# Patient Record
Sex: Female | Born: 1950 | Race: White | Hispanic: No | Marital: Married
Health system: Southern US, Community
[De-identification: ages and names within clinical notes are randomized; demographics above are authoritative.]

## PROBLEM LIST (undated history)

## (undated) DIAGNOSIS — H269 Unspecified cataract: Secondary | ICD-10-CM

## (undated) DIAGNOSIS — R1310 Dysphagia, unspecified: Secondary | ICD-10-CM

## (undated) DIAGNOSIS — M1990 Unspecified osteoarthritis, unspecified site: Secondary | ICD-10-CM

## (undated) DIAGNOSIS — I509 Heart failure, unspecified: Secondary | ICD-10-CM

## (undated) DIAGNOSIS — N189 Chronic kidney disease, unspecified: Secondary | ICD-10-CM

## (undated) DIAGNOSIS — T7840XA Allergy, unspecified, initial encounter: Secondary | ICD-10-CM

## (undated) DIAGNOSIS — M8580 Other specified disorders of bone density and structure, unspecified site: Secondary | ICD-10-CM

## (undated) DIAGNOSIS — R011 Cardiac murmur, unspecified: Secondary | ICD-10-CM

## (undated) DIAGNOSIS — J45909 Unspecified asthma, uncomplicated: Secondary | ICD-10-CM

## (undated) DIAGNOSIS — K219 Gastro-esophageal reflux disease without esophagitis: Secondary | ICD-10-CM

## (undated) DIAGNOSIS — I10 Essential (primary) hypertension: Secondary | ICD-10-CM

## (undated) DIAGNOSIS — K635 Polyp of colon: Secondary | ICD-10-CM

## (undated) DIAGNOSIS — N390 Urinary tract infection, site not specified: Secondary | ICD-10-CM

## (undated) DIAGNOSIS — R0602 Shortness of breath: Secondary | ICD-10-CM

## (undated) DIAGNOSIS — R140 Abdominal distension (gaseous): Secondary | ICD-10-CM

## (undated) DIAGNOSIS — E079 Disorder of thyroid, unspecified: Secondary | ICD-10-CM

## (undated) HISTORY — DX: Unspecified asthma, uncomplicated: J45.909

## (undated) HISTORY — DX: Dysphagia, unspecified: R13.10

## (undated) HISTORY — DX: Allergy, unspecified, initial encounter: T78.40XA

## (undated) HISTORY — PX: FOOT SURGERY: SHX648

## (undated) HISTORY — DX: Essential (primary) hypertension: I10

## (undated) HISTORY — DX: Chronic kidney disease, unspecified: N18.9

## (undated) HISTORY — DX: Urinary tract infection, site not specified: N39.0

## (undated) HISTORY — DX: Unspecified osteoarthritis, unspecified site: M19.90

## (undated) HISTORY — DX: Polyp of colon: K63.5

## (undated) HISTORY — DX: Unspecified cataract: H26.9

## (undated) HISTORY — DX: Gastro-esophageal reflux disease without esophagitis: K21.9

## (undated) HISTORY — PX: ROTATOR CUFF REPAIR: SHX139

## (undated) HISTORY — DX: Shortness of breath: R06.02

## (undated) HISTORY — DX: Cardiac murmur, unspecified: R01.1

## (undated) HISTORY — DX: Heart failure, unspecified: I50.9

## (undated) HISTORY — DX: Disorder of thyroid, unspecified: E07.9

## (undated) HISTORY — DX: Other specified disorders of bone density and structure, unspecified site: M85.80

## (undated) HISTORY — DX: Abdominal distension (gaseous): R14.0

---

## 2008-12-22 DIAGNOSIS — E039 Hypothyroidism, unspecified: Secondary | ICD-10-CM | POA: Insufficient documentation

## 2010-12-14 DIAGNOSIS — L739 Follicular disorder, unspecified: Secondary | ICD-10-CM | POA: Insufficient documentation

## 2012-11-06 DIAGNOSIS — M8580 Other specified disorders of bone density and structure, unspecified site: Secondary | ICD-10-CM | POA: Insufficient documentation

## 2013-08-26 DIAGNOSIS — Z720 Tobacco use: Secondary | ICD-10-CM | POA: Insufficient documentation

## 2015-02-15 DIAGNOSIS — R7301 Impaired fasting glucose: Secondary | ICD-10-CM | POA: Insufficient documentation

## 2015-09-01 DIAGNOSIS — R0602 Shortness of breath: Secondary | ICD-10-CM | POA: Diagnosis not present

## 2015-09-10 DIAGNOSIS — I340 Nonrheumatic mitral (valve) insufficiency: Secondary | ICD-10-CM | POA: Diagnosis not present

## 2015-09-10 DIAGNOSIS — I517 Cardiomegaly: Secondary | ICD-10-CM | POA: Diagnosis not present

## 2015-09-10 DIAGNOSIS — I071 Rheumatic tricuspid insufficiency: Secondary | ICD-10-CM | POA: Diagnosis not present

## 2015-10-13 DIAGNOSIS — R0602 Shortness of breath: Secondary | ICD-10-CM | POA: Diagnosis not present

## 2016-01-10 DIAGNOSIS — E039 Hypothyroidism, unspecified: Secondary | ICD-10-CM | POA: Diagnosis not present

## 2016-03-21 DIAGNOSIS — L680 Hirsutism: Secondary | ICD-10-CM | POA: Diagnosis not present

## 2016-03-21 DIAGNOSIS — D485 Neoplasm of uncertain behavior of skin: Secondary | ICD-10-CM | POA: Diagnosis not present

## 2016-03-21 DIAGNOSIS — L578 Other skin changes due to chronic exposure to nonionizing radiation: Secondary | ICD-10-CM | POA: Diagnosis not present

## 2016-03-21 DIAGNOSIS — L700 Acne vulgaris: Secondary | ICD-10-CM | POA: Diagnosis not present

## 2016-03-22 DIAGNOSIS — L97929 Non-pressure chronic ulcer of unspecified part of left lower leg with unspecified severity: Secondary | ICD-10-CM | POA: Diagnosis not present

## 2016-05-25 DIAGNOSIS — Z23 Encounter for immunization: Secondary | ICD-10-CM | POA: Diagnosis not present

## 2016-06-15 DIAGNOSIS — E039 Hypothyroidism, unspecified: Secondary | ICD-10-CM | POA: Diagnosis not present

## 2016-06-16 DIAGNOSIS — H04123 Dry eye syndrome of bilateral lacrimal glands: Secondary | ICD-10-CM | POA: Diagnosis not present

## 2016-06-16 DIAGNOSIS — Z1231 Encounter for screening mammogram for malignant neoplasm of breast: Secondary | ICD-10-CM | POA: Diagnosis not present

## 2016-06-16 DIAGNOSIS — Z961 Presence of intraocular lens: Secondary | ICD-10-CM | POA: Diagnosis not present

## 2016-06-16 DIAGNOSIS — H26493 Other secondary cataract, bilateral: Secondary | ICD-10-CM | POA: Diagnosis not present

## 2016-06-17 DIAGNOSIS — R42 Dizziness and giddiness: Secondary | ICD-10-CM | POA: Diagnosis not present

## 2016-06-17 DIAGNOSIS — R110 Nausea: Secondary | ICD-10-CM | POA: Diagnosis not present

## 2016-06-17 DIAGNOSIS — H6983 Other specified disorders of Eustachian tube, bilateral: Secondary | ICD-10-CM | POA: Diagnosis not present

## 2016-06-17 DIAGNOSIS — F439 Reaction to severe stress, unspecified: Secondary | ICD-10-CM | POA: Diagnosis not present

## 2016-08-02 DIAGNOSIS — E039 Hypothyroidism, unspecified: Secondary | ICD-10-CM | POA: Diagnosis not present

## 2016-09-21 DIAGNOSIS — E039 Hypothyroidism, unspecified: Secondary | ICD-10-CM | POA: Diagnosis not present

## 2016-11-07 DIAGNOSIS — T3695XA Adverse effect of unspecified systemic antibiotic, initial encounter: Secondary | ICD-10-CM | POA: Diagnosis not present

## 2016-11-07 DIAGNOSIS — R300 Dysuria: Secondary | ICD-10-CM | POA: Diagnosis not present

## 2016-11-07 DIAGNOSIS — N3091 Cystitis, unspecified with hematuria: Secondary | ICD-10-CM | POA: Diagnosis not present

## 2016-11-07 DIAGNOSIS — N3090 Cystitis, unspecified without hematuria: Secondary | ICD-10-CM | POA: Diagnosis not present

## 2016-11-07 DIAGNOSIS — L309 Dermatitis, unspecified: Secondary | ICD-10-CM | POA: Diagnosis not present

## 2016-11-07 DIAGNOSIS — B379 Candidiasis, unspecified: Secondary | ICD-10-CM | POA: Diagnosis not present

## 2016-11-07 DIAGNOSIS — R8290 Unspecified abnormal findings in urine: Secondary | ICD-10-CM | POA: Diagnosis not present

## 2016-11-16 DIAGNOSIS — E039 Hypothyroidism, unspecified: Secondary | ICD-10-CM | POA: Diagnosis not present

## 2017-01-05 DIAGNOSIS — M25511 Pain in right shoulder: Secondary | ICD-10-CM | POA: Diagnosis not present

## 2017-01-05 DIAGNOSIS — M542 Cervicalgia: Secondary | ICD-10-CM | POA: Diagnosis not present

## 2017-01-05 DIAGNOSIS — M79641 Pain in right hand: Secondary | ICD-10-CM | POA: Diagnosis not present

## 2017-01-05 DIAGNOSIS — M79642 Pain in left hand: Secondary | ICD-10-CM | POA: Diagnosis not present

## 2017-01-11 DIAGNOSIS — M542 Cervicalgia: Secondary | ICD-10-CM | POA: Diagnosis not present

## 2017-01-11 DIAGNOSIS — M5013 Cervical disc disorder with radiculopathy, cervicothoracic region: Secondary | ICD-10-CM | POA: Diagnosis not present

## 2017-01-16 DIAGNOSIS — M542 Cervicalgia: Secondary | ICD-10-CM | POA: Diagnosis not present

## 2017-01-16 DIAGNOSIS — M5013 Cervical disc disorder with radiculopathy, cervicothoracic region: Secondary | ICD-10-CM | POA: Diagnosis not present

## 2017-01-18 DIAGNOSIS — M542 Cervicalgia: Secondary | ICD-10-CM | POA: Diagnosis not present

## 2017-01-18 DIAGNOSIS — M5013 Cervical disc disorder with radiculopathy, cervicothoracic region: Secondary | ICD-10-CM | POA: Diagnosis not present

## 2017-01-23 DIAGNOSIS — M542 Cervicalgia: Secondary | ICD-10-CM | POA: Diagnosis not present

## 2017-01-23 DIAGNOSIS — M5013 Cervical disc disorder with radiculopathy, cervicothoracic region: Secondary | ICD-10-CM | POA: Diagnosis not present

## 2017-01-24 DIAGNOSIS — M542 Cervicalgia: Secondary | ICD-10-CM | POA: Diagnosis not present

## 2017-01-24 DIAGNOSIS — M5013 Cervical disc disorder with radiculopathy, cervicothoracic region: Secondary | ICD-10-CM | POA: Diagnosis not present

## 2017-02-01 DIAGNOSIS — M542 Cervicalgia: Secondary | ICD-10-CM | POA: Diagnosis not present

## 2017-02-01 DIAGNOSIS — M5013 Cervical disc disorder with radiculopathy, cervicothoracic region: Secondary | ICD-10-CM | POA: Diagnosis not present

## 2017-02-02 DIAGNOSIS — M542 Cervicalgia: Secondary | ICD-10-CM | POA: Diagnosis not present

## 2017-02-02 DIAGNOSIS — M5013 Cervical disc disorder with radiculopathy, cervicothoracic region: Secondary | ICD-10-CM | POA: Diagnosis not present

## 2017-02-05 DIAGNOSIS — M542 Cervicalgia: Secondary | ICD-10-CM | POA: Diagnosis not present

## 2017-02-05 DIAGNOSIS — M5013 Cervical disc disorder with radiculopathy, cervicothoracic region: Secondary | ICD-10-CM | POA: Diagnosis not present

## 2017-02-07 DIAGNOSIS — M542 Cervicalgia: Secondary | ICD-10-CM | POA: Diagnosis not present

## 2017-02-07 DIAGNOSIS — M5013 Cervical disc disorder with radiculopathy, cervicothoracic region: Secondary | ICD-10-CM | POA: Diagnosis not present

## 2017-02-08 DIAGNOSIS — M5013 Cervical disc disorder with radiculopathy, cervicothoracic region: Secondary | ICD-10-CM | POA: Diagnosis not present

## 2017-02-08 DIAGNOSIS — M542 Cervicalgia: Secondary | ICD-10-CM | POA: Diagnosis not present

## 2017-02-16 DIAGNOSIS — M25511 Pain in right shoulder: Secondary | ICD-10-CM | POA: Diagnosis not present

## 2017-02-16 DIAGNOSIS — M5013 Cervical disc disorder with radiculopathy, cervicothoracic region: Secondary | ICD-10-CM | POA: Diagnosis not present

## 2017-02-16 DIAGNOSIS — M542 Cervicalgia: Secondary | ICD-10-CM | POA: Diagnosis not present

## 2017-02-19 DIAGNOSIS — L309 Dermatitis, unspecified: Secondary | ICD-10-CM | POA: Diagnosis not present

## 2017-02-19 DIAGNOSIS — L570 Actinic keratosis: Secondary | ICD-10-CM | POA: Diagnosis not present

## 2017-02-22 DIAGNOSIS — M542 Cervicalgia: Secondary | ICD-10-CM | POA: Diagnosis not present

## 2017-02-22 DIAGNOSIS — M5013 Cervical disc disorder with radiculopathy, cervicothoracic region: Secondary | ICD-10-CM | POA: Diagnosis not present

## 2017-02-23 DIAGNOSIS — M542 Cervicalgia: Secondary | ICD-10-CM | POA: Diagnosis not present

## 2017-02-23 DIAGNOSIS — M5013 Cervical disc disorder with radiculopathy, cervicothoracic region: Secondary | ICD-10-CM | POA: Diagnosis not present

## 2017-03-05 DIAGNOSIS — M542 Cervicalgia: Secondary | ICD-10-CM | POA: Diagnosis not present

## 2017-03-05 DIAGNOSIS — M47812 Spondylosis without myelopathy or radiculopathy, cervical region: Secondary | ICD-10-CM | POA: Diagnosis not present

## 2017-03-05 DIAGNOSIS — M5013 Cervical disc disorder with radiculopathy, cervicothoracic region: Secondary | ICD-10-CM | POA: Diagnosis not present

## 2017-03-07 DIAGNOSIS — M5013 Cervical disc disorder with radiculopathy, cervicothoracic region: Secondary | ICD-10-CM | POA: Diagnosis not present

## 2017-03-07 DIAGNOSIS — M542 Cervicalgia: Secondary | ICD-10-CM | POA: Diagnosis not present

## 2017-03-09 DIAGNOSIS — N951 Menopausal and female climacteric states: Secondary | ICD-10-CM | POA: Diagnosis not present

## 2017-03-09 DIAGNOSIS — N3281 Overactive bladder: Secondary | ICD-10-CM | POA: Diagnosis not present

## 2017-03-14 DIAGNOSIS — M542 Cervicalgia: Secondary | ICD-10-CM | POA: Diagnosis not present

## 2017-03-14 DIAGNOSIS — M5013 Cervical disc disorder with radiculopathy, cervicothoracic region: Secondary | ICD-10-CM | POA: Diagnosis not present

## 2017-03-22 DIAGNOSIS — M47812 Spondylosis without myelopathy or radiculopathy, cervical region: Secondary | ICD-10-CM | POA: Diagnosis not present

## 2017-04-11 DIAGNOSIS — M5412 Radiculopathy, cervical region: Secondary | ICD-10-CM | POA: Diagnosis not present

## 2017-05-22 DIAGNOSIS — M1990 Unspecified osteoarthritis, unspecified site: Secondary | ICD-10-CM | POA: Diagnosis not present

## 2017-05-22 DIAGNOSIS — Z23 Encounter for immunization: Secondary | ICD-10-CM | POA: Diagnosis not present

## 2017-05-22 DIAGNOSIS — N951 Menopausal and female climacteric states: Secondary | ICD-10-CM | POA: Diagnosis not present

## 2017-06-22 DIAGNOSIS — H524 Presbyopia: Secondary | ICD-10-CM | POA: Diagnosis not present

## 2017-06-22 DIAGNOSIS — H5231 Anisometropia: Secondary | ICD-10-CM | POA: Diagnosis not present

## 2017-06-22 DIAGNOSIS — H43813 Vitreous degeneration, bilateral: Secondary | ICD-10-CM | POA: Diagnosis not present

## 2017-06-25 DIAGNOSIS — Z1231 Encounter for screening mammogram for malignant neoplasm of breast: Secondary | ICD-10-CM | POA: Diagnosis not present

## 2017-06-25 DIAGNOSIS — M8588 Other specified disorders of bone density and structure, other site: Secondary | ICD-10-CM | POA: Diagnosis not present

## 2017-07-31 DIAGNOSIS — M47812 Spondylosis without myelopathy or radiculopathy, cervical region: Secondary | ICD-10-CM | POA: Diagnosis not present

## 2017-08-02 DIAGNOSIS — E038 Other specified hypothyroidism: Secondary | ICD-10-CM | POA: Diagnosis not present

## 2017-08-02 DIAGNOSIS — Z6830 Body mass index (BMI) 30.0-30.9, adult: Secondary | ICD-10-CM | POA: Diagnosis not present

## 2017-08-02 DIAGNOSIS — K219 Gastro-esophageal reflux disease without esophagitis: Secondary | ICD-10-CM | POA: Diagnosis not present

## 2017-08-02 DIAGNOSIS — L680 Hirsutism: Secondary | ICD-10-CM | POA: Diagnosis not present

## 2017-08-02 DIAGNOSIS — M859 Disorder of bone density and structure, unspecified: Secondary | ICD-10-CM | POA: Diagnosis not present

## 2017-08-02 DIAGNOSIS — M5030 Other cervical disc degeneration, unspecified cervical region: Secondary | ICD-10-CM | POA: Diagnosis not present

## 2017-08-02 DIAGNOSIS — E7849 Other hyperlipidemia: Secondary | ICD-10-CM | POA: Diagnosis not present

## 2017-08-02 DIAGNOSIS — Z1389 Encounter for screening for other disorder: Secondary | ICD-10-CM | POA: Diagnosis not present

## 2017-08-02 DIAGNOSIS — N3281 Overactive bladder: Secondary | ICD-10-CM | POA: Diagnosis not present

## 2017-08-02 DIAGNOSIS — R82998 Other abnormal findings in urine: Secondary | ICD-10-CM | POA: Diagnosis not present

## 2017-08-13 ENCOUNTER — Other Ambulatory Visit: Payer: Self-pay | Admitting: Internal Medicine

## 2017-08-13 DIAGNOSIS — F17201 Nicotine dependence, unspecified, in remission: Secondary | ICD-10-CM

## 2017-09-04 ENCOUNTER — Ambulatory Visit
Admission: RE | Admit: 2017-09-04 | Discharge: 2017-09-04 | Disposition: A | Discharge status: Home / Self Care | Payer: Medicare Other | Source: Ambulatory Visit | Attending: Internal Medicine | Admitting: Internal Medicine

## 2017-09-04 DIAGNOSIS — F17201 Nicotine dependence, unspecified, in remission: Secondary | ICD-10-CM

## 2017-09-04 DIAGNOSIS — Z87891 Personal history of nicotine dependence: Secondary | ICD-10-CM | POA: Diagnosis not present

## 2017-09-11 DIAGNOSIS — Z79899 Other long term (current) drug therapy: Secondary | ICD-10-CM | POA: Diagnosis not present

## 2017-09-11 DIAGNOSIS — E039 Hypothyroidism, unspecified: Secondary | ICD-10-CM | POA: Diagnosis not present

## 2017-12-14 ENCOUNTER — Emergency Department (HOSPITAL_BASED_OUTPATIENT_CLINIC_OR_DEPARTMENT_OTHER)
Admission: EM | Admit: 2017-12-14 | Discharge: 2017-12-14 | Disposition: A | Discharge status: Home / Self Care | Payer: Medicare Other | Attending: Physician Assistant | Admitting: Physician Assistant

## 2017-12-14 ENCOUNTER — Other Ambulatory Visit: Payer: Self-pay

## 2017-12-14 ENCOUNTER — Encounter (HOSPITAL_BASED_OUTPATIENT_CLINIC_OR_DEPARTMENT_OTHER): Payer: Self-pay | Admitting: *Deleted

## 2017-12-14 DIAGNOSIS — Z87891 Personal history of nicotine dependence: Secondary | ICD-10-CM | POA: Diagnosis not present

## 2017-12-14 DIAGNOSIS — H8309 Labyrinthitis, unspecified ear: Secondary | ICD-10-CM | POA: Diagnosis not present

## 2017-12-14 DIAGNOSIS — R42 Dizziness and giddiness: Secondary | ICD-10-CM

## 2017-12-14 MED ORDER — DIAZEPAM 5 MG PO TABS: 5.0000 mg | ORAL_TABLET | Freq: Three times a day (TID) | ORAL | 0 refills | Status: DC | PRN

## 2017-12-14 MED ORDER — MECLIZINE HCL 12.5 MG PO TABS: 25.0000 mg | ORAL_TABLET | Freq: Two times a day (BID) | ORAL | 0 refills | Status: DC | PRN

## 2017-12-14 NOTE — Discharge Instructions (Signed)
Please return immediately if you decide that you do want to get an MRI.  You are welcome back any time to receive this test.  Please return if your dizziness comes and continues to stay and does not go away.  Or with any other neurologic symptoms.  Please follow-up with your primary care physician ideally within 24-48 hours.

## 2017-12-14 NOTE — ED Triage Notes (Signed)
She was treated for vertigo last week. She is taking Antivert. She took 2 last night and was dizzy.

## 2017-12-14 NOTE — ED Provider Notes (Signed)
[facility] EMERGENCY DEPARTMENT Provider Note   CSN: [id number] Arrival date & time: 12/14/17  1625     History   Chief Complaint Chief Complaint  Patient presents with  . Dizziness    HPI Katherine Greer is a 67 y.o. female.  HPI   67 year old female sent here with dizziness.  Patient has had episodic dizziness.  Patient had an episode 2 years ago which resolved within the roughly 24-48 hours.  She says it feels the same today.  She did one episode 3 days ago and it got better.  It happened again today.  She reports that happens with position.  When she turns her head or leans over.  She reports that she went to go see a PA this morning.  They did a Dix-Hallpike which made her dizziness much worse.  She was then sent here for further evaluation for stroke.  The dizziness is positional.  It is episodic.  Patient has a feeling of stuffy headedness with sinus congestion and feeling of fluid behind her ears.  Patient has no difficulty ambulating. She drove here.  She has no weakness, no neurologic deficits.  History reviewed. No pertinent past medical history.  There are no active problems to display for this patient.   History reviewed. No pertinent surgical history.   OB History   None      Home Medications    Prior to Admission medications   Not on File    Family History No family history on file.  Social History Social History   Tobacco Use  . Smoking status: Former Research scientist (life sciences)  . Smokeless tobacco: Never Used  Substance Use Topics  . Alcohol use: Yes    Frequency: Never  . Drug use: Never     Allergies   Keflex [cephalexin]; Levaquin [levofloxacin in d5w]; Sulfa antibiotics; and Augmentin [amoxicillin-pot clavulanate]   Review of Systems Review of Systems  Constitutional: Negative for fatigue and fever.  HENT: Positive for sinus pressure.   Respiratory: Negative for chest tightness.   Neurological: Positive for dizziness. Negative for  tremors, syncope, facial asymmetry, speech difficulty, weakness, numbness and headaches.  All other systems reviewed and are negative.    Physical Exam Updated Vital Signs BP 126/72   Pulse 67   Temp 98.3 F (36.8 C) (Oral)   Resp 20   Ht 5\' 7"  (1.702 m)   Wt 86.2 kg (190 lb)   SpO2 100%   BMI 29.76 kg/m   Physical Exam  Constitutional: She is oriented to person, place, and time. She appears well-developed and well-nourished.  Well appearing female, not dizzy  HENT:  Head: Normocephalic and atraumatic.  Bilateral ears with mild bulging.  No erythema.  Nystagmus both eyes  Eyes: Right eye exhibits no discharge. Left eye exhibits no discharge.  Cardiovascular: Normal rate, regular rhythm and normal heart sounds.  No murmur heard. Pulmonary/Chest: Effort normal and breath sounds normal. She has no wheezes. She has no rales.  Abdominal: Soft. She exhibits no distension. There is no tenderness.  Musculoskeletal:  Finger to nose intact.  Patient able to ambulate without issue.  Cranial nerves II through VII appear intact.  Patient mild unsteadiness with closed eye rhomberg, open eye normal.     Neurological: She is oriented to person, place, and time. No cranial nerve deficit or sensory deficit.  Skin: Skin is warm and dry. She is not diaphoretic.  Psychiatric: She has a normal mood and affect.  Nursing note and vitals reviewed.  ED Treatments / Results  Labs (all labs ordered are listed, but only abnormal results are displayed) Labs Reviewed - No data to display  EKG None  Radiology No results found.  Procedures Procedures (including critical care time)  Medications Ordered in ED Medications - No data to display   Initial Impression / Assessment and Plan / ED Course  I have reviewed the triage vital signs and the nursing notes.  Pertinent labs & imaging results that were available during my care of the patient were reviewed by me and considered in my  medical decision making (see chart for details).    67 year old female sent here with dizziness.  Patient has had episodic dizziness.  Patient had an episode 2 years ago which resolved within the roughly 24-48 hours.  She says it feels the same today.  She did one episode 3 days ago and it got better.  It happened again today.  She reports that happens with position.  When she turns her head or leans over.  She reports that she went to go see a PA this morning.  They did a Dix-Hallpike which made her dizziness much worse.  She was then sent here for further evaluation for stroke.  The dizziness is positional.  It is episodic.  Patient has a feeling of stuffy headedness with sinus congestion and feeling of fluid behind her ears.  Patient has no difficulty ambulating. She drove here.  She has no weakness, no neurologic deficits.  5:46 PM Had a long detailed discussion with both patient and patient's husband about imaging.  In order to effectively rule out cerebellar stroke, we would need to transfer to [facility] for MRI.  Patient has no neurologic findings significant for stroke at this moment.  She has episodic dizziness.  Bilateral nystagmus.  And worsening with this Dix-Hallpike removed procedure.  All of these point to a peripheral, vestibular source for this.  Including patient's feelings of stuffy sinuses and bulging eardrums.   Patient appears so well.  She is well put together, drove here, has no dizziness on exam.  It is difficult to consider that she could have a stroke.  We discussed how strokes do not get better and worse and especially not usually with positionally changes.   However fgiven age, should at least consider rule out stroke.   Offered transfer to [facility].  Patient centered discussion had and questions answered.  Decision made to try outpatient treatment first, she will follow up with PCP.     Final Clinical Impressions(s) / ED Diagnoses   Final diagnoses:  None    ED  Discharge Orders    None       Jyll Tomaro, Fredia Sorrow, MD 12/14/17 1750

## 2017-12-18 DIAGNOSIS — J019 Acute sinusitis, unspecified: Secondary | ICD-10-CM | POA: Diagnosis not present

## 2017-12-18 DIAGNOSIS — R42 Dizziness and giddiness: Secondary | ICD-10-CM | POA: Diagnosis not present

## 2017-12-18 DIAGNOSIS — Z6830 Body mass index (BMI) 30.0-30.9, adult: Secondary | ICD-10-CM | POA: Diagnosis not present

## 2018-01-04 DIAGNOSIS — H8141 Vertigo of central origin, right ear: Secondary | ICD-10-CM | POA: Diagnosis not present

## 2018-01-04 DIAGNOSIS — H8110 Benign paroxysmal vertigo, unspecified ear: Secondary | ICD-10-CM | POA: Diagnosis not present

## 2018-01-04 DIAGNOSIS — H9041 Sensorineural hearing loss, unilateral, right ear, with unrestricted hearing on the contralateral side: Secondary | ICD-10-CM | POA: Diagnosis not present

## 2018-01-04 DIAGNOSIS — H9311 Tinnitus, right ear: Secondary | ICD-10-CM | POA: Diagnosis not present

## 2018-01-15 DIAGNOSIS — H8110 Benign paroxysmal vertigo, unspecified ear: Secondary | ICD-10-CM | POA: Diagnosis not present

## 2018-01-16 ENCOUNTER — Other Ambulatory Visit (HOSPITAL_COMMUNITY): Payer: Self-pay | Admitting: Otolaryngology

## 2018-01-16 DIAGNOSIS — IMO0001 Reserved for inherently not codable concepts without codable children: Secondary | ICD-10-CM

## 2018-01-23 ENCOUNTER — Ambulatory Visit (HOSPITAL_COMMUNITY)
Admission: RE | Admit: 2018-01-23 | Discharge: 2018-01-23 | Disposition: A | Discharge status: Home / Self Care | Payer: Medicare Other | Source: Ambulatory Visit | Attending: Otolaryngology | Admitting: Otolaryngology

## 2018-01-23 DIAGNOSIS — D692 Other nonthrombocytopenic purpura: Secondary | ICD-10-CM | POA: Diagnosis not present

## 2018-01-23 DIAGNOSIS — L821 Other seborrheic keratosis: Secondary | ICD-10-CM | POA: Diagnosis not present

## 2018-01-23 DIAGNOSIS — R42 Dizziness and giddiness: Secondary | ICD-10-CM | POA: Diagnosis not present

## 2018-01-23 DIAGNOSIS — D225 Melanocytic nevi of trunk: Secondary | ICD-10-CM | POA: Diagnosis not present

## 2018-01-23 DIAGNOSIS — I998 Other disorder of circulatory system: Secondary | ICD-10-CM | POA: Insufficient documentation

## 2018-01-23 DIAGNOSIS — IMO0001 Reserved for inherently not codable concepts without codable children: Secondary | ICD-10-CM

## 2018-01-23 DIAGNOSIS — H8141 Vertigo of central origin, right ear: Secondary | ICD-10-CM | POA: Insufficient documentation

## 2018-01-23 DIAGNOSIS — L309 Dermatitis, unspecified: Secondary | ICD-10-CM | POA: Diagnosis not present

## 2018-01-23 DIAGNOSIS — L814 Other melanin hyperpigmentation: Secondary | ICD-10-CM | POA: Diagnosis not present

## 2018-01-23 MED ORDER — GADOBENATE DIMEGLUMINE 529 MG/ML IV SOLN
20.0000 mL | Freq: Once | INTRAVENOUS | Status: AC | PRN
  Administered 2018-01-23: 18 mL via INTRAVENOUS

## 2018-01-24 LAB — POCT I-STAT CREATININE: Creatinine, Ser: 0.9 mg/dL (ref 0.44–1.00)

## 2018-01-25 DIAGNOSIS — E7849 Other hyperlipidemia: Secondary | ICD-10-CM | POA: Diagnosis not present

## 2018-01-25 DIAGNOSIS — E038 Other specified hypothyroidism: Secondary | ICD-10-CM | POA: Diagnosis not present

## 2018-02-04 DIAGNOSIS — I2510 Atherosclerotic heart disease of native coronary artery without angina pectoris: Secondary | ICD-10-CM | POA: Diagnosis not present

## 2018-02-04 DIAGNOSIS — E038 Other specified hypothyroidism: Secondary | ICD-10-CM | POA: Diagnosis not present

## 2018-02-04 DIAGNOSIS — J439 Emphysema, unspecified: Secondary | ICD-10-CM | POA: Diagnosis not present

## 2018-02-04 DIAGNOSIS — Z6831 Body mass index (BMI) 31.0-31.9, adult: Secondary | ICD-10-CM | POA: Diagnosis not present

## 2018-02-04 DIAGNOSIS — N3281 Overactive bladder: Secondary | ICD-10-CM | POA: Diagnosis not present

## 2018-02-04 DIAGNOSIS — R42 Dizziness and giddiness: Secondary | ICD-10-CM | POA: Diagnosis not present

## 2018-03-14 DIAGNOSIS — R300 Dysuria: Secondary | ICD-10-CM | POA: Diagnosis not present

## 2018-04-03 ENCOUNTER — Other Ambulatory Visit (HOSPITAL_COMMUNITY): Payer: Self-pay | Admitting: Internal Medicine

## 2018-04-03 DIAGNOSIS — R279 Unspecified lack of coordination: Secondary | ICD-10-CM | POA: Diagnosis not present

## 2018-04-03 DIAGNOSIS — R0602 Shortness of breath: Secondary | ICD-10-CM | POA: Diagnosis not present

## 2018-04-03 DIAGNOSIS — H539 Unspecified visual disturbance: Secondary | ICD-10-CM

## 2018-04-03 DIAGNOSIS — Z6829 Body mass index (BMI) 29.0-29.9, adult: Secondary | ICD-10-CM | POA: Diagnosis not present

## 2018-04-03 DIAGNOSIS — R2689 Other abnormalities of gait and mobility: Secondary | ICD-10-CM

## 2018-04-03 DIAGNOSIS — J019 Acute sinusitis, unspecified: Secondary | ICD-10-CM | POA: Diagnosis not present

## 2018-04-04 ENCOUNTER — Ambulatory Visit (HOSPITAL_COMMUNITY): Payer: Medicare Other

## 2018-04-05 ENCOUNTER — Ambulatory Visit (HOSPITAL_COMMUNITY)
Admission: RE | Admit: 2018-04-05 | Discharge: 2018-04-05 | Disposition: A | Discharge status: Home / Self Care | Payer: Medicare Other | Source: Ambulatory Visit | Attending: Internal Medicine | Admitting: Internal Medicine

## 2018-04-05 DIAGNOSIS — R279 Unspecified lack of coordination: Secondary | ICD-10-CM | POA: Insufficient documentation

## 2018-04-05 DIAGNOSIS — R2689 Other abnormalities of gait and mobility: Secondary | ICD-10-CM

## 2018-04-05 DIAGNOSIS — H539 Unspecified visual disturbance: Secondary | ICD-10-CM | POA: Diagnosis not present

## 2018-04-05 DIAGNOSIS — M50121 Cervical disc disorder at C4-C5 level with radiculopathy: Secondary | ICD-10-CM | POA: Diagnosis not present

## 2018-04-05 DIAGNOSIS — M4802 Spinal stenosis, cervical region: Secondary | ICD-10-CM | POA: Diagnosis not present

## 2018-04-05 DIAGNOSIS — M50323 Other cervical disc degeneration at C6-C7 level: Secondary | ICD-10-CM | POA: Diagnosis not present

## 2018-04-05 LAB — POCT I-STAT CREATININE: Creatinine, Ser: 0.8 mg/dL (ref 0.44–1.00)

## 2018-04-05 MED ORDER — GADOBENATE DIMEGLUMINE 529 MG/ML IV SOLN
18.0000 mL | Freq: Once | INTRAVENOUS | Status: AC
  Administered 2018-04-05: 18 mL via INTRAVENOUS

## 2018-04-06 ENCOUNTER — Ambulatory Visit (HOSPITAL_COMMUNITY): Payer: Medicare Other

## 2018-04-25 ENCOUNTER — Encounter: Payer: Self-pay | Admitting: Gastroenterology

## 2018-04-30 DIAGNOSIS — H903 Sensorineural hearing loss, bilateral: Secondary | ICD-10-CM | POA: Diagnosis not present

## 2018-04-30 DIAGNOSIS — J343 Hypertrophy of nasal turbinates: Secondary | ICD-10-CM | POA: Diagnosis not present

## 2018-04-30 DIAGNOSIS — R42 Dizziness and giddiness: Secondary | ICD-10-CM | POA: Diagnosis not present

## 2018-04-30 DIAGNOSIS — J310 Chronic rhinitis: Secondary | ICD-10-CM | POA: Diagnosis not present

## 2018-04-30 DIAGNOSIS — H838X3 Other specified diseases of inner ear, bilateral: Secondary | ICD-10-CM | POA: Diagnosis not present

## 2018-05-02 ENCOUNTER — Other Ambulatory Visit (INDEPENDENT_AMBULATORY_CARE_PROVIDER_SITE_OTHER): Payer: Medicare Other

## 2018-05-02 ENCOUNTER — Encounter: Payer: Self-pay | Admitting: Gastroenterology

## 2018-05-02 ENCOUNTER — Ambulatory Visit (INDEPENDENT_AMBULATORY_CARE_PROVIDER_SITE_OTHER): Payer: Medicare Other | Admitting: Gastroenterology

## 2018-05-02 VITALS — BP 114/78 | HR 82 | Ht 66.5 in | Wt 194.4 lb

## 2018-05-02 DIAGNOSIS — K219 Gastro-esophageal reflux disease without esophagitis: Secondary | ICD-10-CM

## 2018-05-02 DIAGNOSIS — R140 Abdominal distension (gaseous): Secondary | ICD-10-CM

## 2018-05-02 DIAGNOSIS — R0602 Shortness of breath: Secondary | ICD-10-CM

## 2018-05-02 DIAGNOSIS — R1310 Dysphagia, unspecified: Secondary | ICD-10-CM | POA: Diagnosis not present

## 2018-05-02 DIAGNOSIS — Z860100 Personal history of colon polyps, unspecified: Secondary | ICD-10-CM

## 2018-05-02 DIAGNOSIS — K649 Unspecified hemorrhoids: Secondary | ICD-10-CM | POA: Diagnosis not present

## 2018-05-02 LAB — CBC
HCT: 42.7 % (ref 36.0–46.0)
HEMOGLOBIN: 14.5 g/dL (ref 12.0–15.0)
MCHC: 33.9 g/dL (ref 30.0–36.0)
MCV: 89.7 fl (ref 78.0–100.0)
PLATELETS: 333 10*3/uL (ref 150.0–400.0)
RBC: 4.76 Mil/uL (ref 3.87–5.11)
RDW: 13.2 % (ref 11.5–15.5)
WBC: 8.4 10*3/uL (ref 4.0–10.5)

## 2018-05-02 LAB — IRON: Iron: 55 ug/dL (ref 42–145)

## 2018-05-02 MED ORDER — ONDANSETRON HCL 4 MG PO TABS: ORAL_TABLET | ORAL | 0 refills | Status: DC

## 2018-05-02 MED ORDER — PEG 3350-KCL-NA BICARB-NACL 420 G PO SOLR: 4000.0000 mL | ORAL | 0 refills | Status: DC

## 2018-05-02 NOTE — Progress Notes (Signed)
[facility] VISIT   Primary Care Provider Crist Infante, MD [address] [place] [address] [phone]  Referring Provider Crist Infante, MD [address] [place], [place] [address] [phone]  Patient Profile: Katherine Greer is a 67 y.o. female with a pmh significant for Colon Polyps (TAs & SSAs), Asthma, GERD, Thyroid Disease, Hemorrhoids, hyperlipidemia, overactive bladder.  The patient presents to the [facility] for an evaluation and management of problem(s) noted below:  Problem List 1. Gastroesophageal reflux disease, esophagitis presence not specified   2. History of colonic polyps   3. Pill dysphagia   4. Hemorrhoids, unspecified hemorrhoid type   5. Abdominal bloating   6. SOB (shortness of breath)     History of Present Illness: This is the patient's first visit to the [facility] GI clinic.  She is here today to transition her care from her previous [facility] gastroenterologist as she now lives in [place] and wants all her caring tone.  She moved to about a year ago after dealing with issues of her son's passing from an untimely cancer/malignancy diagnosis.  She has long-standing history of issues including GERD/pyrosis.  She describes in the past being on Nexium and then being transitioned to Prilosec for maintenance therapy.  She remains on once daily Prilosec 20 mg in the evening and if she does this she usually has good control of her pyrosis.  If she has increased symptoms because she has not followed lifestyle modifications or she is eating poorly she will take a Zantac at night.  This will relieve her symptoms completely.  She has never been on twice daily PPI.  She describes an infrequent dysphasia to pills that occurs every couple of weeks.  She describes no solid food or liquid dysphasia however.  When she describes issues with her pill it seems that she cannot even move the pill from her tongue to the back of her  throat into her esophagus and is more of a transit of her oropharynx rather than the esophagus.  She does describe having an upper endoscopy though it was more than 10 years prior and does not recall ever being told that she has a ring or stricture.  She also has a history of rectal bleeding which has been presumed to be from hemorrhoids for which she has previously had a hemorrhoid worked on years ago.  She has not had any rectal bleeding for years.  She will infrequently have constipation.  She recently was started on antibiotics and as a result started on probiotics to try and minimize issues of developing an infection after having had antibiotics.  During that time she had some slightly change in her bowel movement pattern and color however that resolved after she had completed her antibiotics and probiotics.  She infrequently has issues of bloating with flatulence but this does not keep her from her activities of daily living.  Overall she is doing well.  In 2016 she underwent a colonoscopy in [facility] for which we have the records.  At that time she underwent her colonoscopy she was found to have 5 polyps removed via forceps or hot snare and pathology reviewed suggested that she had multiple tubular adenomas as well as multiple sessile serrated polyps.  She used Suprep and had a perfect prep per [facility] bowel preparation scale.  At the time of this colonoscopy no hemorrhoids were noted.  GI Review of Systems Positive as above Negative for odynophagia, nausea, vomiting, urgency, tenesmus, hematochezia, melena  Review  of Systems General: Denies fevers/chills/weight loss/night sweats HEENT: Denies oral lesions Cardiovascular: Denies chest pain Pulmonary: She has noted increased shortness of breath as well as some dyspnea on exertion over the course of the last few months (mostly occurring when she has been active and walking; she has no history of any pulmonary or cardiovascular disease  personally) Gastroenterological: See HPI Genitourinary: Denies hematuria or darkened urine Hematological: Denies easy bruising/bleeding Dermatological: Denies skin changes Psychological: Mood is stable Musculoskeletal: Denies new arthralgias   Medications Current Outpatient Medications  Medication Sig Dispense Refill  . atorvastatin (LIPITOR) 40 MG tablet Take 40 mg by mouth daily.  3  . Cholecalciferol 2000 units CAPS Take 2,000 Units by mouth daily.    . meclizine (ANTIVERT) 12.5 MG tablet Take 2 tablets (25 mg total) by mouth 2 (two) times daily as needed for dizziness. 15 tablet 0  . omeprazole (PRILOSEC) 20 MG capsule Take 20 mg by mouth daily.    Marland Kitchen spironolactone (ALDACTONE) 50 MG tablet Take 50 mg by mouth 2 (two) times daily.  3  . tolterodine (DETROL LA) 4 MG 24 hr capsule Take 4 mg by mouth daily.    . ondansetron (ZOFRAN) 4 MG tablet Take 1 tab 30-60 minutes before drinking colonoscopy prep 2 tablet 0  . polyethylene glycol-electrolytes (NULYTELY/GOLYTELY) 420 g solution Take 4,000 mLs by mouth as directed. 4000 mL 0   No current facility-administered medications for this visit.     Allergies Allergies  Allergen Reactions  . Keflex [Cephalexin] Anaphylaxis  . Levaquin [Levofloxacin In D5w] Anaphylaxis  . Sulfa Antibiotics Anaphylaxis  . Augmentin [Amoxicillin-Pot Clavulanate]     rash    Histories Past Medical History:  Diagnosis Date  . Asthma   . Colon polyp   . GERD (gastroesophageal reflux disease)   . Thyroid disease   . UTI (urinary tract infection)    Past Surgical History:  Procedure Laterality Date  . FOOT SURGERY Bilateral    Social History   Socioeconomic History  . Marital status: Married    Spouse name: Not on file  . Number of children: 2  . Years of education: Not on file  . Highest education level: Not on file  Occupational History  . 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 Financial resource strain: Not on  file  . Food insecurity:    Worry: Not on file    Inability: Not on file  . Transportation needs:    Medical: Not on file    Non-medical: Not on file  Tobacco Use  . Smoking status: Former Research scientist (life sciences)  . Smokeless tobacco: Never Used  Substance and Sexual Activity  . Alcohol use: Yes    Frequency: Never  . Drug use: Never  . Sexual activity: Yes  Lifestyle  . Physical activity:    Days per week: Not on file    Minutes per session: Not on file  . Stress: Not on file  Relationships  . Social connections:    Talks on phone: Not on file    Gets together: Not on file    Attends religious service: Not on file    Active member of club or organization: Not on file    Attends meetings of clubs or organizations: Not on file    Relationship status: Not on file  . Intimate partner violence:    Fear of current or ex partner: Not on file    Emotionally abused: Not on file  Physically abused: Not on file    Forced sexual activity: Not on file  Other Topics Concern  . Not on file  Social History Narrative  . Not on file   Family History  Problem Relation Age of Onset  . Cancer Son        Pituitary- Metastatic Brain  . Colon cancer Neg Hx   . Esophageal cancer Neg Hx   . Inflammatory bowel disease Neg Hx   . Liver disease Neg Hx   . Pancreatic cancer Neg Hx   . Stomach cancer Neg Hx    I have reviewed her medical, social, and family history in detail and updated the electronic medical record as necessary.    PHYSICAL EXAMINATION  BP 114/78   Pulse 82   Ht 5' 6.5" (1.689 m)   Wt 194 lb 6 oz (88.2 kg)   BMI 30.90 kg/m  Wt Readings from Last 3 Encounters:  05/02/18 194 lb 6 oz (88.2 kg)  12/14/17 190 lb (86.2 kg)  GEN: NAD, appears stated age, doesn't appear chronically ill PSYCH: Cooperative, without pressured speech EYE: Conjunctivae pink, sclerae anicteric ENT: MMM, without oral ulcers, no erythema or exudates noted NECK: Supple CV: RR without R/Gs  RESP: CTAB  posteriorly GI: NABS, soft, NT/ND, without rebound or guarding, no HSM appreciated MSK/EXT: No lower extremity edema SKIN: No jaundice NEURO:  Alert & Oriented x 3, no focal deficits   REVIEW OF DATA  I reviewed the following data at the time of this encounter:  GI Procedures and Studies  2016 colonoscopy (outside report) 5 polyps removed between 2 and 4 mm in size otherwise a normal exam. Pathology consistent with TAs and SSAs  Laboratory Studies  Reviewed in epic and care everywhere  Imaging Studies  No relevant studies   ASSESSMENT  Ms. Vallie is a 67 y.o. female with a pmh significant for Colon Polyps (TAs & SSAs), Asthma, GERD, Thyroid Disease, Hemorrhoids, hyperlipidemia, overactive bladder.   The patient is seen today for evaluation and management of:  1. Gastroesophageal reflux disease, esophagitis presence not specified   2. History of colonic polyps   3. Pill dysphagia   4. Hemorrhoids, unspecified hemorrhoid type   5. Abdominal bloating   6. SOB (shortness of breath)    This is a hemodynamically stable patient who is wanting to transfer her gastrointestinal care to [facility].  She has multiple GI issues as outlined in the HPI above in regards to her acid reflux/pyrosis she has been stable on her current dosing of Prilosec and only has breakthrough/exacerbations if she does not follow her lifestyle modifications or watch her diet.  There is no need for any adjustment in her current medication dosing at this point in time.  She describes issues of a pill transit dysphagia from her mouth into her esophagus without true esophageal dysphagia to solids or liquids.  I do not think it is reasonable to increase her PPI at this point in time.  We will hold on an endoscopic evaluation at this point in time.  We did discuss briefly evaluation by ENT to evaluate the throat as well as a consideration if symptoms were to worsen to have a speech-language pathology evaluation to evaluate  her transit of the oropharynx.  She will defer on this currently because this symptom occurs intermittently.  She has longer standing issues with changes in her bowel habits in regards to constipation that occur based on the type of foods that she eats.  She  has had issues of hemorrhoids in the past that have required a hemorrhoidectomy per her report.  She has not noticed any bleeding and there is nothing to pursue at this point in time.  She does have a history of colon polyps as documented in the notes that were obtained prior to her clinic visit from her previous GI physician in [place].  She has a history of sessile serrated polyps as well as adenomas that were found in 2016 for a total of 4.  She is due for colon polyp surveillance this year and it would be reasonable to pursue that.  She has had some increasing shortness of breath and mild dyspnea on exertion that has been occurring over the course the last few months.  We will plan to obtain anemia studies to evaluate for iron deficiency to evaluate whether she would also potentially need an upper endoscopy at time of colonoscopy.  The patient otherwise feels comfortable with continuing to be managed and followed periodically as necessary in this clinic as well as with her PCP.  The risks and benefits of endoscopic evaluation were discussed with the patient; these include but are not limited to the risk of perforation, infection, bleeding, missed lesions, lack of diagnosis, severe illness requiring hospitalization, as well as anesthesia and sedation related illnesses.  The patient is agreeable to proceed.  All questions were answered to the best of my ability.   PLAN  1. Gastroesophageal reflux disease, esophagitis presence not specified - Continue PPI QHS (if increasing symptoms or more breakthrough will consider increasing) - Zantac PRN reasonable as she is doing  2. History of colonic polyps - Due for colonoscopy for history of TAs and  SSAs  3. Pill dysphagia - Not true globus but not esophageal in nature - If more recurrent issues then will proceed with SLP evaluation & ENT evaluation & consider higher dose PPI (but not clear PPI will be effective)  4. Hemorrhoids, unspecified hemorrhoid type  5. Abdominal bloating - Continue to monitor - If significant increased symptoms query SIBO evaluation  6. SOB (shortness of breath) - She will discuss with PCP for further workup but will ensure not IDA - Iron; Future - CBC; Future - Iron Binding Cap (TIBC); Future   Orders Placed This Encounter  Procedures  . Iron  . CBC  . Iron Binding Cap (TIBC)  . Ambulatory referral to Gastroenterology    New Prescriptions   ONDANSETRON (ZOFRAN) 4 MG TABLET    Take 1 tab 30-60 minutes before drinking colonoscopy prep   POLYETHYLENE GLYCOL-ELECTROLYTES (NULYTELY/GOLYTELY) 420 G SOLUTION    Take 4,000 mLs by mouth as directed.   Modified Medications   No medications on file    Planned Follow Up: No follow-ups on file.   Justice Britain, MD [facility] Office # [phone]

## 2018-05-02 NOTE — Patient Instructions (Signed)
Your provider has requested that you go to the basement level for lab work before leaving today. Press "B" on the elevator. The lab is located at the first door on the left as you exit the elevator.  You have been scheduled for a colonoscopy. Please follow written instructions given to you at your visit today.  Please pick up your prep supplies at the pharmacy within the next 1-3 days. If you use inhalers (even only as needed), please bring them with you on the day of your procedure. Your physician has requested that you go to www.startemmi.com and enter the access code given to you at your visit today. This web site gives a general overview about your procedure. However, you should still follow specific instructions given to you by our office regarding your preparation for the procedure.  Normal BMI (Body Mass Index- based on height and weight) is between 23 and 30. Your BMI today is Body mass index is 30.9 kg/m. Marland Kitchen Please consider follow up  regarding your BMI with your Primary Care Provider.

## 2018-05-03 DIAGNOSIS — R0602 Shortness of breath: Secondary | ICD-10-CM | POA: Insufficient documentation

## 2018-05-03 DIAGNOSIS — K219 Gastro-esophageal reflux disease without esophagitis: Secondary | ICD-10-CM | POA: Insufficient documentation

## 2018-05-03 DIAGNOSIS — R1310 Dysphagia, unspecified: Secondary | ICD-10-CM | POA: Insufficient documentation

## 2018-05-03 DIAGNOSIS — K649 Unspecified hemorrhoids: Secondary | ICD-10-CM | POA: Insufficient documentation

## 2018-05-03 DIAGNOSIS — R140 Abdominal distension (gaseous): Secondary | ICD-10-CM

## 2018-05-03 DIAGNOSIS — Z860100 Personal history of colon polyps, unspecified: Secondary | ICD-10-CM | POA: Insufficient documentation

## 2018-05-03 HISTORY — DX: Shortness of breath: R06.02

## 2018-05-03 HISTORY — DX: Dysphagia, unspecified: R13.10

## 2018-05-03 HISTORY — DX: Abdominal distension (gaseous): R14.0

## 2018-05-03 LAB — IRON AND TIBC
Iron Saturation: 18 % (ref 15–55)
Iron: 53 ug/dL (ref 27–139)
TIBC: 295 ug/dL (ref 250–450)
UIBC: 242 ug/dL (ref 118–369)

## 2018-05-04 ENCOUNTER — Encounter: Payer: Self-pay | Admitting: Gastroenterology

## 2018-05-29 DIAGNOSIS — L309 Dermatitis, unspecified: Secondary | ICD-10-CM | POA: Diagnosis not present

## 2018-06-06 DIAGNOSIS — Z124 Encounter for screening for malignant neoplasm of cervix: Secondary | ICD-10-CM | POA: Diagnosis not present

## 2018-06-06 DIAGNOSIS — N390 Urinary tract infection, site not specified: Secondary | ICD-10-CM | POA: Diagnosis not present

## 2018-06-06 DIAGNOSIS — Z6831 Body mass index (BMI) 31.0-31.9, adult: Secondary | ICD-10-CM | POA: Diagnosis not present

## 2018-06-24 ENCOUNTER — Telehealth: Payer: Self-pay | Admitting: *Deleted

## 2018-06-24 NOTE — Telephone Encounter (Signed)
Patient called and was nauseated just drinking the clear liquids and she had the Golytely prep to complete. Spoke with Dr. Rush Landmark and will cancel the patient and order Suprep for her and Zofran for the next appointment. Pt was rescheduled for Nov 1 for previsit and Nov 6 for colonoscopy. SM

## 2018-06-25 ENCOUNTER — Encounter: Payer: Medicare Other | Admitting: Gastroenterology

## 2018-06-25 MED ORDER — NA SULFATE-K SULFATE-MG SULF 17.5-3.13-1.6 GM/177ML PO SOLN: 1.0000 | Freq: Once | ORAL | 0 refills | Status: AC

## 2018-06-26 DIAGNOSIS — J343 Hypertrophy of nasal turbinates: Secondary | ICD-10-CM | POA: Diagnosis not present

## 2018-06-26 DIAGNOSIS — J310 Chronic rhinitis: Secondary | ICD-10-CM | POA: Diagnosis not present

## 2018-06-26 DIAGNOSIS — R51 Headache: Secondary | ICD-10-CM | POA: Diagnosis not present

## 2018-06-27 ENCOUNTER — Other Ambulatory Visit (INDEPENDENT_AMBULATORY_CARE_PROVIDER_SITE_OTHER): Payer: Self-pay | Admitting: Otolaryngology

## 2018-06-27 ENCOUNTER — Other Ambulatory Visit: Payer: Self-pay | Admitting: Otolaryngology

## 2018-06-27 DIAGNOSIS — J329 Chronic sinusitis, unspecified: Secondary | ICD-10-CM

## 2018-06-28 ENCOUNTER — Ambulatory Visit
Admission: RE | Admit: 2018-06-28 | Discharge: 2018-06-28 | Disposition: A | Discharge status: Home / Self Care | Payer: Medicare Other | Source: Ambulatory Visit | Attending: Otolaryngology | Admitting: Otolaryngology

## 2018-06-28 ENCOUNTER — Ambulatory Visit (AMBULATORY_SURGERY_CENTER): Payer: Self-pay

## 2018-06-28 VITALS — Ht 67.0 in | Wt 195.0 lb

## 2018-06-28 DIAGNOSIS — Z86010 Personal history of colonic polyps: Secondary | ICD-10-CM

## 2018-06-28 DIAGNOSIS — J329 Chronic sinusitis, unspecified: Secondary | ICD-10-CM

## 2018-06-28 DIAGNOSIS — R0981 Nasal congestion: Secondary | ICD-10-CM | POA: Diagnosis not present

## 2018-06-28 MED ORDER — ONDANSETRON HCL 4 MG PO TABS: 4.0000 mg | ORAL_TABLET | ORAL | 0 refills | Status: DC

## 2018-06-28 MED ORDER — NA SULFATE-K SULFATE-MG SULF 17.5-3.13-1.6 GM/177ML PO SOLN: 1.0000 | Freq: Once | ORAL | 0 refills | Status: AC

## 2018-06-28 NOTE — Progress Notes (Signed)
Denies allergies to eggs or soy products. Denies complication of anesthesia or sedation. Denies use of weight loss medication. Denies use of O2.   Emmi instructions declined.  

## 2018-07-01 ENCOUNTER — Encounter: Payer: Self-pay | Admitting: *Deleted

## 2018-07-03 ENCOUNTER — Ambulatory Visit (AMBULATORY_SURGERY_CENTER): Payer: Medicare Other | Admitting: Gastroenterology

## 2018-07-03 ENCOUNTER — Encounter: Payer: Self-pay | Admitting: Gastroenterology

## 2018-07-03 VITALS — BP 143/75 | HR 65 | Temp 97.5°F | Resp 11 | Ht 66.0 in | Wt 194.0 lb

## 2018-07-03 DIAGNOSIS — D120 Benign neoplasm of cecum: Secondary | ICD-10-CM

## 2018-07-03 DIAGNOSIS — D122 Benign neoplasm of ascending colon: Secondary | ICD-10-CM

## 2018-07-03 DIAGNOSIS — Z1211 Encounter for screening for malignant neoplasm of colon: Secondary | ICD-10-CM | POA: Diagnosis not present

## 2018-07-03 DIAGNOSIS — Z86010 Personal history of colonic polyps: Secondary | ICD-10-CM

## 2018-07-03 DIAGNOSIS — D127 Benign neoplasm of rectosigmoid junction: Secondary | ICD-10-CM

## 2018-07-03 MED ORDER — SODIUM CHLORIDE 0.9 % IV SOLN: 500.0000 mL | Freq: Once | INTRAVENOUS | Status: DC

## 2018-07-03 NOTE — Progress Notes (Signed)
Pt's states no medical or surgical changes since previsit or office visit. 

## 2018-07-03 NOTE — Patient Instructions (Addendum)
Thank you for allowing [place] to care for you today!  Await pathology results by mail, approximately 2 weeks.  Next colonoscopy will be determined after results are final.  Recommend a high fiber diet.  Handout provided.   Resume previous diet and medications today.  Return to normal activities tomorrow.      YOU HAD AN ENDOSCOPIC PROCEDURE TODAY AT [facility]:   Refer to the procedure report that was given to you for any specific questions about what was found during the examination.  If the procedure report does not answer your questions, please call your gastroenterologist to clarify.  If you requested that your care partner not be given the details of your procedure findings, then the procedure report has been included in a sealed envelope for you to review at your convenience later.  YOU SHOULD EXPECT: Some feelings of bloating in the abdomen. Passage of more gas than usual.  Walking can help get rid of the air that was put into your GI tract during the procedure and reduce the bloating. If you had a lower endoscopy (such as a colonoscopy or flexible sigmoidoscopy) you may notice spotting of blood in your stool or on the toilet paper. If you underwent a bowel prep for your procedure, you may not have a normal bowel movement for a few days.  Please Note:  You might notice some irritation and congestion in your nose or some drainage.  This is from the oxygen used during your procedure.  There is no need for concern and it should clear up in a day or so.  SYMPTOMS TO REPORT IMMEDIATELY:   Following lower endoscopy (colonoscopy or flexible sigmoidoscopy):  Excessive amounts of blood in the stool  Significant tenderness or worsening of abdominal pains  Swelling of the abdomen that is new, acute  Fever of 100F or higher   For urgent or emergent issues, a gastroenterologist can be reached at any hour by calling [phone].   DIET:  We do recommend a small meal at  first, but then you may proceed to your regular diet.  Drink plenty of fluids but you should avoid alcoholic beverages for 24 hours.  ACTIVITY:  You should plan to take it easy for the rest of today and you should NOT DRIVE or use heavy machinery until tomorrow (because of the sedation medicines used during the test).    FOLLOW UP: Our staff will call the number listed on your records the next business day following your procedure to check on you and address any questions or concerns that you may have regarding the information given to you following your procedure. If we do not reach you, we will leave a message.  However, if you are feeling well and you are not experiencing any problems, there is no need to return our call.  We will assume that you have returned to your regular daily activities without incident.  If any biopsies were taken you will be contacted by phone or by letter within the next 1-3 weeks.  Please call us at [phone] if you have not heard about the biopsies in 3 weeks.    SIGNATURES/CONFIDENTIALITY: You and/or your care partner have signed paperwork which will be entered into your electronic medical record.  These signatures attest to the fact that that the information above on your After Visit Summary has been reviewed and is understood.  Full responsibility of the confidentiality of this discharge information lies with you and/or  your care-partner. 

## 2018-07-03 NOTE — Op Note (Signed)
[facility] Patient Name: Katherine Greer Procedure Date: 07/03/2018 9:28 AM MRN: [id number] Endoscopist: Justice Britain , MD Age: 67 Referring MD:  Date of Birth: [birth year] Gender: Female Account #: [id number] Procedure:                Colonoscopy Indications:              Surveillance: Personal history of adenomatous                            polyps on last colonoscopy 3 years ago Medicines:                Monitored Anesthesia Care Procedure:                Pre-Anesthesia Assessment:                           - Prior to the procedure, a History and Physical                            was performed, and patient medications and                            allergies were reviewed. The patient's tolerance of                            previous anesthesia was also reviewed. The risks                            and benefits of the procedure and the sedation                            options and risks were discussed with the patient.                            All questions were answered, and informed consent                            was obtained. Prior Anticoagulants: The patient has                            taken no previous anticoagulant or antiplatelet                            agents. ASA Grade Assessment: II - A patient with                            mild systemic disease. After reviewing the risks                            and benefits, the patient was deemed in                            satisfactory condition to undergo the procedure.  After obtaining informed consent, the colonoscope                            was passed under direct vision. Throughout the                            procedure, the patient's blood pressure, pulse, and                            oxygen saturations were monitored continuously. The                            Colonoscope was introduced through the anus and                            advanced to the the cecum,  identified by the                            appendiceal orifice, IC valve and                            transillumination. The colonoscopy was performed                            without difficulty. The patient tolerated the                            procedure. The quality of the bowel preparation was                            evaluated using the BBPS [facility] Bowel Preparation                            Scale) with scores of: Right Colon = 2 (minor                            amount of residual staining, small fragments of                            stool and/or opaque liquid, but mucosa seen well),                            Transverse Colon = 3 (entire mucosa seen well with                            no residual staining, small fragments of stool or                            opaque liquid) and Left Colon = 2 (minor amount of                            residual staining, small fragments of stool and/or  opaque liquid, but mucosa seen well). The total                            BBPS score equals 7. The quality of the bowel                            preparation was fair. Scope In: 9:37:46 AM Scope Out: 10:04:27 AM Scope Withdrawal Time: 0 hours 20 minutes 46 seconds  Total Procedure Duration: 0 hours 26 minutes 41 seconds  Findings:                 Skin tags were found on perianal exam.                           The digital rectal exam was abnormal. Pertinent                            negatives include no palpable rectal lesions.                           The terminal ileum and ileocecal valve appeared                            normal.                           Six sessile polyps were found in the recto-sigmoid                            colon, ascending colon and cecum. The polyps were 1                            to 6 mm in size. These polyps were removed with a                            cold snare. Resection and retrieval were complete.                            A 8 mm polyp was found in the recto-sigmoid colon.                            The polyp was sessile. The polyp was removed with a                            hot snare. Resection and retrieval were complete.                           Multiple small-mouthed diverticula were found in                            the recto-sigmoid colon, sigmoid colon and                            descending colon.  Normal mucosa was found in the entire colon                            otherwise.                           Non-bleeding non-thrombosed internal hemorrhoids                            were found during retroflexion. The hemorrhoids                            were Grade I (internal hemorrhoids that do not                            prolapse). Complications:            No immediate complications. Estimated Blood Loss:     Estimated blood loss was minimal. Impression:               - Preparation of the colon was fair.                           - Perianal skin tags found on perianal exam.                           - Abnormal digital rectal exam.                           - The examined portion of the ileum was normal.                           - Six 1 to 6 mm polyps at the recto-sigmoid colon,                            in the ascending colon and in the cecum, removed                            with a cold snare. Resected and retrieved.                           - One 8 mm polyp at the recto-sigmoid colon,                            removed with a hot snare. Resected and retrieved.                           - Diverticulosis in the recto-sigmoid colon, in the                            sigmoid colon and in the descending colon.                           - Normal mucosa in the entire examined colon  otherwise.                           - Non-bleeding non-thrombosed internal hemorrhoids. Recommendation:           - The patient will be observed  post-procedure,                            until all discharge criteria are met.                           - Discharge patient to home.                           - Patient has a contact number available for                            emergencies. The signs and symptoms of potential                            delayed complications were discussed with the                            patient. Return to normal activities tomorrow.                            Written discharge instructions were provided to the                            patient.                           - High fiber diet.                           - Await pathology results.                           - Repeat colonoscopy in 3 years for surveillance                            based on pathology results.                           - Consider use of pediatric colonoscope at next                            evaluation due to left-sided diverticulosis.                           - The findings and recommendations were discussed                            with the patient.                           - The findings and recommendations were discussed  with the patient's family. Justice Britain, MD 07/03/2018 10:12:07 AM

## 2018-07-03 NOTE — Progress Notes (Signed)
Called to room to assist during endoscopic procedure.  Patient ID and intended procedure confirmed with present staff. Received instructions for my participation in the procedure from the performing physician.  

## 2018-07-03 NOTE — Progress Notes (Signed)
PT taken to PACU. Monitors in place. VSS. Report given to RN. 

## 2018-07-04 ENCOUNTER — Telehealth: Payer: Self-pay

## 2018-07-04 DIAGNOSIS — Z23 Encounter for immunization: Secondary | ICD-10-CM | POA: Diagnosis not present

## 2018-07-04 NOTE — Telephone Encounter (Signed)
  Follow up Call-  Call back number 07/03/2018  Post procedure Call Back phone  # [phone]  Permission to leave phone message Yes  Some recent data might be hidden     Patient questions:  Do you have a fever, pain , or abdominal swelling? No. Pain Score  0 *  Have you tolerated food without any problems? Yes.    Have you been able to return to your normal activities? Yes.    Do you have any questions about your discharge instructions: Diet   No. Medications  No. Follow up visit  No.  Do you have questions or concerns about your Care? No.  Actions: * If pain score is 4 or above: No action needed, pain <4.

## 2018-07-08 ENCOUNTER — Encounter: Payer: Self-pay | Admitting: Gastroenterology

## 2018-07-11 ENCOUNTER — Ambulatory Visit (INDEPENDENT_AMBULATORY_CARE_PROVIDER_SITE_OTHER): Payer: Medicare Other | Admitting: Neurology

## 2018-07-11 ENCOUNTER — Encounter: Payer: Self-pay | Admitting: Neurology

## 2018-07-11 ENCOUNTER — Other Ambulatory Visit: Payer: Self-pay

## 2018-07-11 VITALS — BP 178/108 | HR 80 | Resp 18 | Ht 66.0 in | Wt 195.0 lb

## 2018-07-11 DIAGNOSIS — G4489 Other headache syndrome: Secondary | ICD-10-CM

## 2018-07-11 MED ORDER — PROPRANOLOL HCL 20 MG PO TABS: ORAL_TABLET | ORAL | 3 refills | Status: DC

## 2018-07-11 MED ORDER — TRAMADOL HCL 50 MG PO TABS: 50.0000 mg | ORAL_TABLET | Freq: Four times a day (QID) | ORAL | 1 refills | Status: DC | PRN

## 2018-07-11 NOTE — Progress Notes (Signed)
Reason for visit: Headache  Referring physician: Dr. Christy Gentles Katherine Greer is a 67 y.o. female  History of present illness:  Katherine Greer is a 67 year old right-handed white female with a history of episodic vertigo that has been present off and on over the last 2 years.  The patient had a recent event around Easter of 2019 that lasted about a month and then she had a brief episode several weeks later lasting only 24 hours.  The patient also has chronic neck pain and has ear pressure on the right.  The patient has undergone MRI of the brain that was relatively unremarkable, and MRI of the cervical spine was done showing small disc protrusions and spondylitic changes most significant at the C6-7 level.  The patient indicates that she has never really had headaches off and on throughout her life, occasionally she may have some sinus issues.  The patient began having headaches about 1 month ago.  The patient has discomfort in the medial portion of the eye socket on the right, she will also have discomfort in the back of the head on the right with tenderness to touch.  She has some neck stiffness as well.  The headaches are better in the morning, worse as the day goes on.  She takes ibuprofen and Tylenol for the headache with some benefit.  She may have some nausea, he also has some blurring of vision.  She reports no numbness or weakness of the face, arms, legs.  She denies any balance changes or difficulty controlling the bowels or the bladder.  She has been told that her blood pressures been elevated over the last month as well.  The patient recently has been on decongestant medications.  She comes to this office for an evaluation.   Past Medical History:  Diagnosis Date  . Allergy   . Arthritis   . Asthma   . Cataract   . Colon polyp   . GERD (gastroesophageal reflux disease)   . Heart murmur   . Osteopenia   . Thyroid disease   . UTI (urinary tract infection)     Past Surgical History:    Procedure Laterality Date  . FOOT SURGERY Bilateral     Family History  Problem Relation Age of Onset  . Cancer Son        Pituitary- Metastatic Brain  . Heart attack Mother   . Diabetes Father   . Chronic Renal Failure Father   . Lung cancer Father   . Breast cancer Sister   . Diabetes Brother   . Colon cancer Neg Hx   . Esophageal cancer Neg Hx   . Inflammatory bowel disease Neg Hx   . Liver disease Neg Hx   . Pancreatic cancer Neg Hx   . Stomach cancer Neg Hx   . Rectal cancer Neg Hx     Social history:  reports that she has quit smoking. She has never used smokeless tobacco. She reports that she drinks alcohol. She reports that she does not use drugs.  Medications:  Prior to Admission medications   Medication Sig Start Date End Date Taking? Authorizing Provider  acetaminophen (TYLENOL) 500 MG tablet Take 500 mg by mouth every 6 (six) hours as needed.   Yes [provider]  atorvastatin (LIPITOR) 40 MG tablet Take 40 mg by mouth daily. 02/03/18  Yes [provider]  Cholecalciferol 2000 units CAPS Take 2,000 Units by mouth daily. 02/08/15  Yes [provider]  dextromethorphan-guaiFENesin (MUCINEX DM) 30-600 MG 12hr tablet Take 1 tablet by mouth 2 (two) times daily.   Yes [provider]  levothyroxine (SYNTHROID, LEVOTHROID) 112 MCG tablet Take 112 mcg by mouth daily before breakfast.   Yes [provider]  naproxen sodium (ALEVE) 220 MG tablet Take 220 mg by mouth daily as needed.   Yes [provider]  omeprazole (PRILOSEC) 20 MG capsule Take 20 mg by mouth daily. 06/04/17  Yes [provider]  OVER THE COUNTER MEDICATION Volteran Gel, As needed.   Yes [provider]  pseudoephedrine-acetaminophen (TYLENOL SINUS) 30-500 MG TABS tablet Take 2 tablets by mouth every 4 (four) hours as needed.   Yes [provider]  spironolactone (ALDACTONE) 50 MG tablet Take 50 mg by mouth 2 (two) times daily.  02/22/18  Yes [provider]  tolterodine (DETROL LA) 4 MG 24 hr capsule Take 4 mg by mouth daily. 02/15/15  Yes [provider]      Allergies  Allergen Reactions  . Keflex [Cephalexin] Anaphylaxis  . Levaquin [Levofloxacin In D5w] Anaphylaxis  . Sulfa Antibiotics Anaphylaxis  . Augmentin [Amoxicillin-Pot Clavulanate]     rash  . Latex Itching    ROS:  Out of a complete 14 system review of symptoms, the patient complains only of the following symptoms, and all other reviewed systems are negative.  Heart murmur Blurred vision Shortness of breath, cough, wheezing, snoring Easy bruising, easy bleeding  Blood pressure (!) 178/108, pulse 80, resp. rate 18, height 5\' 6"  (1.676 m), weight 195 lb (88.5 kg).  Physical Exam  General: The patient is alert and cooperative at the time of the examination.  Eyes: Pupils are equal, round, and reactive to light. Discs are flat bilaterally.  Neck: The neck is supple, no carotid bruits are noted.  Respiratory: The respiratory examination is clear.  Cardiovascular: The cardiovascular examination reveals a regular rate and rhythm, no obvious murmurs or rubs are noted.  Neuromuscular: Range of movement the cervical spine lacks about 15 degrees of full lateral rotation bilaterally.  Skin: Extremities are without significant edema.  Neurologic Exam  Mental status: The patient is alert and oriented x 3 at the time of the examination. The patient has apparent normal recent and remote memory, with an apparently normal attention span and concentration ability.  Cranial nerves: Facial symmetry is present. There is good sensation of the face to pinprick and soft touch bilaterally. The strength of the facial muscles and the muscles to head turning and shoulder shrug are normal bilaterally. Speech is well enunciated, no aphasia or dysarthria is noted. Extraocular movements are full. Visual fields are full. The tongue is midline, and  the patient has symmetric elevation of the soft palate. No obvious hearing deficits are noted.  Motor: The motor testing reveals 5 over 5 strength of all 4 extremities. Good symmetric motor tone is noted throughout.  Sensory: Sensory testing is intact to pinprick, soft touch, vibration sensation, and position sense on all 4 extremities. No evidence of extinction is noted.  Coordination: Cerebellar testing reveals good finger-nose-finger and heel-to-shin bilaterally.  Gait and station: Gait is normal. Tandem gait is normal. Romberg is negative. No drift is seen.  Reflexes: Deep tendon reflexes are symmetric and normal bilaterally. Toes are downgoing bilaterally.    MRI brain 01/23/18:  IMPRESSION: 1. Cranial nerve 7 and 8 complexes are intact. No mass or abnormal enhancement. No abnormal signal of inner ear structures, mastoid air cells, or the middle ear cavities.  2. Mild chronic microvascular ischemic changes and parenchymal volume loss of the brain.  * MRI scan images were reviewed online. I agree with the written report.   MRI cervical 04/05/18:  IMPRESSION: 1. 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 2. Small upper cervical central disc protrusions without spinal stenosis. 3. Moderate multilevel facet arthrosis.     Assessment/Plan:  1.  New onset headache  2.  Cervical spondylosis  3.  Hypertension  The patient has significant hypertension that has developed, she indicates that her systolic blood pressure normally is below 120.  It is possible that the blood pressure may be the causal factor in her headache.  The patient also has cervical arthritis that may also be a contributing factor.  The patient will be started on propranolol taking 20 mg twice daily for a week and then go to 40 mg twice daily.  She will call if she is not tolerating the medication.  The patient will be checking  blood pressures at home.  She will stop the ibuprofen, use Ultram as needed for headache.  She will have blood work done today to check a sedimentation rate.  She will follow-up in 2 to 3 months.  Jill Alexanders MD 07/11/2018 1:32 PM  [facility] [address] [place], [place] [address]  Phone [phone] Fax [phone]

## 2018-07-11 NOTE — Patient Instructions (Signed)
We will start propranolol for the headache and blood pressure.  Inderal (propranolol) is a blood pressure medication that is commonly used for migraine headaches. This is a type of beta blocker. The most common side effects include low heart rate, dizziness, fatigue, and increased depression. This medication may worsen asthma. If you believe that you are having side effects on this medication, please contact our office.

## 2018-07-12 ENCOUNTER — Telehealth: Payer: Self-pay

## 2018-07-12 LAB — C-REACTIVE PROTEIN: CRP: 13 mg/L — ABNORMAL HIGH (ref 0–10)

## 2018-07-12 LAB — SEDIMENTATION RATE: Sed Rate: 10 mm/hr (ref 0–40)

## 2018-07-12 NOTE — Telephone Encounter (Signed)
I called the patient.  The patient has some queasiness from the propranolol when taken on empty stomach, she is to eat first, and then take the medication about an hour later.

## 2018-07-12 NOTE — Telephone Encounter (Signed)
-----   Message from Kathrynn Ducking, MD sent at 07/12/2018  7:25 AM EST ----- Sedimentation rate is normal, C-reactive protein slightly elevated, no significant clinical concern. Please call the patient. ----- Message ----- From: Lavone Neri Lab Results In Sent: 07/12/2018   5:39 AM EST To: Kathrynn Ducking, MD

## 2018-07-12 NOTE — Telephone Encounter (Signed)
I called pt and advised her of her lab results. Pt verbalized understanding of results.  Pt reports that she read that she should take the propranolol before meals. I reiterated that this generally means on an empty stomach. ([employer] confirms that propranolol should be administered preferably on an empty stomach.) Pt is worried about taking this on an empty stomach because she usually gets nauseous when she takes medications without food. Pt wants to know if Dr. Jannifer Franklin has a preference how she takes it.

## 2018-07-15 ENCOUNTER — Other Ambulatory Visit: Payer: Self-pay

## 2018-07-15 ENCOUNTER — Encounter (HOSPITAL_COMMUNITY): Payer: Self-pay

## 2018-07-15 ENCOUNTER — Emergency Department (HOSPITAL_COMMUNITY): Payer: Medicare Other

## 2018-07-15 ENCOUNTER — Emergency Department (HOSPITAL_COMMUNITY)
Admission: EM | Admit: 2018-07-15 | Discharge: 2018-07-15 | Disposition: A | Discharge status: Home / Self Care | Payer: Medicare Other | Attending: Emergency Medicine | Admitting: Emergency Medicine

## 2018-07-15 DIAGNOSIS — G43C1 Periodic headache syndromes in child or adult, intractable: Secondary | ICD-10-CM | POA: Diagnosis not present

## 2018-07-15 DIAGNOSIS — Z87891 Personal history of nicotine dependence: Secondary | ICD-10-CM | POA: Insufficient documentation

## 2018-07-15 DIAGNOSIS — Z91040 Latex allergy status: Secondary | ICD-10-CM | POA: Diagnosis not present

## 2018-07-15 DIAGNOSIS — J45909 Unspecified asthma, uncomplicated: Secondary | ICD-10-CM | POA: Diagnosis not present

## 2018-07-15 DIAGNOSIS — R51 Headache: Secondary | ICD-10-CM | POA: Diagnosis not present

## 2018-07-15 DIAGNOSIS — R519 Headache, unspecified: Secondary | ICD-10-CM

## 2018-07-15 DIAGNOSIS — Z79899 Other long term (current) drug therapy: Secondary | ICD-10-CM | POA: Insufficient documentation

## 2018-07-15 DIAGNOSIS — R030 Elevated blood-pressure reading, without diagnosis of hypertension: Secondary | ICD-10-CM | POA: Diagnosis not present

## 2018-07-15 DIAGNOSIS — Z6829 Body mass index (BMI) 29.0-29.9, adult: Secondary | ICD-10-CM | POA: Diagnosis not present

## 2018-07-15 LAB — CBC WITH DIFFERENTIAL/PLATELET
Abs Immature Granulocytes: 0.09 10*3/uL — ABNORMAL HIGH (ref 0.00–0.07)
Basophils Absolute: 0.1 10*3/uL (ref 0.0–0.1)
Basophils Relative: 0 %
EOS ABS: 0.1 10*3/uL (ref 0.0–0.5)
EOS PCT: 1 %
HEMATOCRIT: 43.2 % (ref 36.0–46.0)
HEMOGLOBIN: 13.8 g/dL (ref 12.0–15.0)
Immature Granulocytes: 1 %
LYMPHS ABS: 1.8 10*3/uL (ref 0.7–4.0)
Lymphocytes Relative: 12 %
MCH: 29.2 pg (ref 26.0–34.0)
MCHC: 31.9 g/dL (ref 30.0–36.0)
MCV: 91.3 fL (ref 80.0–100.0)
MONOS PCT: 7 %
Monocytes Absolute: 1 10*3/uL (ref 0.1–1.0)
NRBC: 0 % (ref 0.0–0.2)
Neutro Abs: 12.2 10*3/uL — ABNORMAL HIGH (ref 1.7–7.7)
Neutrophils Relative %: 79 %
PLATELETS: 363 10*3/uL (ref 150–400)
RBC: 4.73 MIL/uL (ref 3.87–5.11)
RDW: 12.1 % (ref 11.5–15.5)
WBC: 15.3 10*3/uL — ABNORMAL HIGH (ref 4.0–10.5)

## 2018-07-15 LAB — COMPREHENSIVE METABOLIC PANEL
ALK PHOS: 104 U/L (ref 38–126)
ALT: 19 U/L (ref 0–44)
AST: 17 U/L (ref 15–41)
Albumin: 4 g/dL (ref 3.5–5.0)
Anion gap: 8 (ref 5–15)
BUN: 23 mg/dL (ref 8–23)
CALCIUM: 9.3 mg/dL (ref 8.9–10.3)
CO2: 25 mmol/L (ref 22–32)
CREATININE: 1.2 mg/dL — AB (ref 0.44–1.00)
Chloride: 101 mmol/L (ref 98–111)
GFR calc non Af Amer: 45 mL/min — ABNORMAL LOW (ref >60)
GFR, EST AFRICAN AMERICAN: 52 mL/min — AB (ref >60)
GLUCOSE: 123 mg/dL — AB (ref 70–99)
Potassium: 3.9 mmol/L (ref 3.5–5.1)
SODIUM: 134 mmol/L — AB (ref 135–145)
Total Bilirubin: 1.1 mg/dL (ref 0.3–1.2)
Total Protein: 7.4 g/dL (ref 6.5–8.1)

## 2018-07-15 LAB — CSF CELL COUNT WITH DIFFERENTIAL
RBC COUNT CSF: 50 /mm3 — AB
RBC Count, CSF: 0 /mm3
TUBE #: 1
TUBE #: 4
WBC, CSF: 1 /mm3 (ref 0–5)
WBC, CSF: 1 /mm3 (ref 0–5)

## 2018-07-15 LAB — PROTEIN AND GLUCOSE, CSF
GLUCOSE CSF: 64 mg/dL (ref 40–70)
Total  Protein, CSF: 62 mg/dL — ABNORMAL HIGH (ref 15–45)

## 2018-07-15 MED ORDER — HYDROMORPHONE HCL 1 MG/ML IJ SOLN
1.0000 mg | Freq: Once | INTRAMUSCULAR | Status: AC
  Administered 2018-07-15: 1 mg via INTRAVENOUS
  Filled 2018-07-15: qty 1

## 2018-07-15 MED ORDER — SODIUM CHLORIDE 0.9 % IV BOLUS
1000.0000 mL | Freq: Once | INTRAVENOUS | Status: AC
  Administered 2018-07-15: 1000 mL via INTRAVENOUS

## 2018-07-15 MED ORDER — ONDANSETRON HCL 4 MG/2ML IJ SOLN
4.0000 mg | Freq: Once | INTRAMUSCULAR | Status: AC
  Administered 2018-07-15: 4 mg via INTRAVENOUS
  Filled 2018-07-15: qty 2

## 2018-07-15 MED ORDER — TOPIRAMATE 25 MG PO TABS: 25.0000 mg | ORAL_TABLET | Freq: Two times a day (BID) | ORAL | 0 refills | Status: DC

## 2018-07-15 MED ORDER — KETOROLAC TROMETHAMINE 15 MG/ML IJ SOLN
15.0000 mg | Freq: Once | INTRAMUSCULAR | Status: AC
  Administered 2018-07-15: 15 mg via INTRAVENOUS
  Filled 2018-07-15: qty 1

## 2018-07-15 MED ORDER — SODIUM CHLORIDE 0.9 % IV SOLN
INTRAVENOUS | Status: DC
  Administered 2018-07-15: 11:00:00 via INTRAVENOUS

## 2018-07-15 MED ORDER — MAGNESIUM SULFATE 2 GM/50ML IV SOLN
2.0000 g | Freq: Once | INTRAVENOUS | Status: AC
  Administered 2018-07-15: 2 g via INTRAVENOUS
  Filled 2018-07-15: qty 50

## 2018-07-15 MED ORDER — MORPHINE SULFATE (PF) 4 MG/ML IV SOLN
4.0000 mg | Freq: Once | INTRAVENOUS | Status: AC
  Administered 2018-07-15: 4 mg via INTRAVENOUS
  Filled 2018-07-15: qty 1

## 2018-07-15 MED ORDER — PROMETHAZINE HCL 25 MG/ML IJ SOLN
12.5000 mg | Freq: Once | INTRAMUSCULAR | Status: AC
  Administered 2018-07-15: 12.5 mg via INTRAVENOUS
  Filled 2018-07-15: qty 1

## 2018-07-15 MED ORDER — SODIUM CHLORIDE 0.9 % IV SOLN
500.0000 mg | Freq: Once | INTRAVENOUS | Status: AC
  Administered 2018-07-15: 500 mg via INTRAVENOUS
  Filled 2018-07-15: qty 2

## 2018-07-15 MED ORDER — BARIUM SULFATE 2.1 % PO SUSP
ORAL | Status: AC
  Filled 2018-07-15: qty 2

## 2018-07-15 NOTE — ED Provider Notes (Signed)
[facility] EMERGENCY DEPARTMENT Provider Note   CSN: [id number] Arrival date & time: 07/15/18  1001     History   Chief Complaint Chief Complaint  Patient presents with  . Headache    HPI Katherine Greer is a 67 y.o. female.  Pt presents to the ED today with severe headache.  The pt has been having a headache for about a month.  She saw Dr. Jannifer Franklin (neurology) on 11/14 for the headaches.  Her bp was elevated at the office, so he started her on propranolol and ultram.  He did an ESR and CRP which were ok.  She said the ultram is not helping.  She woke up with a headache this morning that would not go away.  Headache is all over her head. MRI brain 01/23/18:  IMPRESSION: 1. Cranial nerve 7 and 8 complexes are intact. No mass or abnormal enhancement. No abnormal signal of inner ear structures, mastoid air cells, or the middle ear cavities. 2. Mild chronic microvascular ischemic changes and parenchymal volume loss of the brain.  MRI cervical 04/05/18:  IMPRESSION: 1. 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 2. Small upper cervical central disc protrusions without spinal stenosis. 3. Moderate multilevel facet arthrosis.       Past Medical History:  Diagnosis Date  . Allergy   . Arthritis   . Asthma   . Cataract   . Colon polyp   . GERD (gastroesophageal reflux disease)   . Heart murmur   . Osteopenia   . Thyroid disease   . UTI (urinary tract infection)     Patient Active Problem List   Diagnosis Date Noted  . Gastroesophageal reflux disease 05/03/2018  . History of colonic polyps 05/03/2018  . SOB (shortness of breath) 05/03/2018  . Hemorrhoids 05/03/2018  . Abdominal bloating 05/03/2018  . Pill dysphagia 05/03/2018    Past Surgical History:  Procedure Laterality Date  . FOOT SURGERY Bilateral      OB History   None      Home Medications     Prior to Admission medications   Medication Sig Start Date End Date Taking? Authorizing Provider  acetaminophen (TYLENOL) 500 MG tablet Take 1,000 mg by mouth every 6 (six) hours as needed for mild pain or moderate pain.    Yes [provider]  atorvastatin (LIPITOR) 40 MG tablet Take 40 mg by mouth daily. 02/03/18  Yes [provider]  Cholecalciferol 2000 units CAPS Take 2,000 Units by mouth daily. 02/08/15  Yes [provider]  dextromethorphan-guaiFENesin (MUCINEX DM) 30-600 MG 12hr tablet Take 1 tablet by mouth 2 (two) times daily.   Yes [provider]  ibuprofen (ADVIL,MOTRIN) 200 MG tablet Take 400 mg by mouth every 6 (six) hours as needed for mild pain or moderate pain.   Yes [provider]  levothyroxine (SYNTHROID, LEVOTHROID) 112 MCG tablet Take 112 mcg by mouth daily before breakfast.   Yes [provider]  nicotine polacrilex (COMMIT) 2 MG lozenge Take 2 mg by mouth as needed for smoking cessation.   Yes [provider]  omeprazole (PRILOSEC) 20 MG capsule Take 20 mg by mouth daily. 06/04/17  Yes [provider]  OVER THE COUNTER MEDICATION Volteran Gel, As needed.   Yes [provider]  propranolol (INDERAL) 20 MG tablet One tablet twice a day for 1 week, then take 2 twice a day Patient taking differently: Take  20 mg by mouth 2 (two) times daily.  07/11/18  Yes Kathrynn Ducking, MD  pseudoephedrine-acetaminophen (TYLENOL SINUS) 30-500 MG TABS tablet Take 2 tablets by mouth every 4 (four) hours as needed.   Yes [provider]  spironolactone (ALDACTONE) 50 MG tablet Take 100 mg by mouth daily.  02/22/18  Yes [provider]  tolterodine (DETROL LA) 4 MG 24 hr capsule Take 4 mg by mouth daily. 02/15/15  Yes [provider]  traMADol (ULTRAM) 50 MG tablet Take 1 tablet (50 mg total) by mouth every 6 (six) hours as needed. Patient taking differently: Take 100 mg by mouth every 6  (six) hours as needed.  07/11/18  Yes Kathrynn Ducking, MD    Family History Family History  Problem Relation Age of Onset  . Cancer Son        Pituitary- Metastatic Brain  . Heart attack Mother   . Diabetes Father   . Chronic Renal Failure Father   . Lung cancer Father   . Breast cancer Sister   . Diabetes Brother   . Colon cancer Neg Hx   . Esophageal cancer Neg Hx   . Inflammatory bowel disease Neg Hx   . Liver disease Neg Hx   . Pancreatic cancer Neg Hx   . Stomach cancer Neg Hx   . Rectal cancer Neg Hx     Social History Social History   Tobacco Use  . Smoking status: Former Research scientist (life sciences)  . Smokeless tobacco: Never Used  . Tobacco comment: Quit 2015  Substance Use Topics  . Alcohol use: Yes    Frequency: Never    Comment: wine 4 times a week  . Drug use: Never     Allergies   Keflex [cephalexin]; Levaquin [levofloxacin in d5w]; Sulfa antibiotics; Augmentin [amoxicillin-pot clavulanate]; and Latex   Review of Systems Review of Systems  Gastrointestinal: Positive for nausea.  Neurological: Positive for headaches.  All other systems reviewed and are negative.    Physical Exam Updated Vital Signs BP (!) 106/92   Pulse 72   Temp 97.9 F (36.6 C) (Oral)   Resp 16   Ht 5' 6.75" (1.695 m)   Wt 88.5 kg   SpO2 95%   BMI 30.77 kg/m   Physical Exam  Constitutional: She is oriented to person, place, and time. She appears well-developed and well-nourished.  HENT:  Head: Normocephalic and atraumatic.  Mouth/Throat: Mucous membranes are dry.  Eyes: Pupils are equal, round, and reactive to light. EOM are normal.  Neck: Normal range of motion. Neck supple.  Cardiovascular: Normal rate and regular rhythm.  Pulmonary/Chest: Effort normal and breath sounds normal.  Abdominal: Soft. Bowel sounds are normal.  Musculoskeletal: Normal range of motion.  Neurological: She is alert and oriented to person, place, and time. She has normal strength. She displays a negative  Romberg sign.  Skin: Skin is warm. Capillary refill takes less than 2 seconds.  Psychiatric: She has a normal mood and affect. Her behavior is normal.  Nursing note and vitals reviewed.    ED Treatments / Results  Labs (all labs ordered are listed, but only abnormal results are displayed) Labs Reviewed  COMPREHENSIVE METABOLIC PANEL - Abnormal; Notable for the following components:      Result Value   Sodium 134 (*)    Glucose, Bld 123 (*)    Creatinine, Ser 1.20 (*)    GFR calc non Af Amer 45 (*)    GFR calc Af Amer 52 (*)  All other components within normal limits  CBC WITH DIFFERENTIAL/PLATELET - Abnormal; Notable for the following components:   WBC 15.3 (*)    Neutro Abs 12.2 (*)    Abs Immature Granulocytes 0.09 (*)    All other components within normal limits  CSF CULTURE  GRAM STAIN  CSF CELL COUNT WITH DIFFERENTIAL  CSF CELL COUNT WITH DIFFERENTIAL  PROTEIN AND GLUCOSE, CSF    EKG None  Radiology Ct Head Wo Contrast  Result Date: 07/15/2018 CLINICAL DATA:  Severe headache over the last 6 weeks. EXAM: CT HEAD WITHOUT CONTRAST TECHNIQUE: Contiguous axial images were obtained from the base of the skull through the vertex without intravenous contrast. COMPARISON:  06/28/2018 CT.  01/23/2018 MRI. FINDINGS: Brain: The brain does not show accelerated atrophy. There is mild chronic small-vessel change of the cerebral hemispheric white matter. No sign of acute infarction, mass lesion, hemorrhage, hydrocephalus or extra-axial collection. Vascular: There is atherosclerotic calcification of the major vessels at the base of the brain. Skull: Normal.  No traumatic finding.  No focal bone lesion. Sinuses/Orbits: Sinuses are clear. Orbits appear normal. Mastoids are clear. Other: None significant IMPRESSION: Normal except for mild chronic small-vessel change of the hemispheric white matter. No abnormality seen to explain the presenting symptoms. Electronically Signed   By: Nelson Chimes M.D.   On: 07/15/2018 12:46    Procedures .Lumbar Puncture Date/Time: 07/15/2018 3:50 PM Performed by: Isla Pence, MD Authorized by: Isla Pence, MD   Consent:    Consent obtained:  Written   Consent given by:  Spouse   Risks discussed:  Infection, bleeding, pain, repeat procedure and headache   Alternatives discussed:  No treatment Pre-procedure details:    Procedure purpose:  Diagnostic   Preparation: Patient was prepped and draped in usual sterile fashion   Anesthesia (see MAR for exact dosages):    Anesthesia method:  Local infiltration   Local anesthetic:  Lidocaine 1% w/o epi Procedure details:    Lumbar space:  L4-L5 interspace   Patient position:  R lateral decubitus   Needle gauge:  20   Needle type:  Spinal needle - Quincke tip   Needle length (in):  3.5   Ultrasound guidance: no     Number of attempts:  2   Fluid appearance:  Clear   Tubes of fluid:  4   Total volume (ml):  4 Post-procedure:    Puncture site:  Adhesive bandage applied   Patient tolerance of procedure:  Tolerated well, no immediate complications   (including critical care time)  Medications Ordered in ED Medications  sodium chloride 0.9 % bolus 1,000 mL (0 mLs Intravenous Stopped 07/15/18 1154)    And  0.9 %  sodium chloride infusion ( Intravenous Stopped 07/15/18 1509)  Barium Sulfate 2.1 % SUSP (has no administration in time range)  sodium chloride 0.9 % bolus 1,000 mL (1,000 mLs Intravenous New Bag/Given 07/15/18 1513)  morphine 4 MG/ML injection 4 mg (4 mg Intravenous Given 07/15/18 1053)  ondansetron (ZOFRAN) injection 4 mg (4 mg Intravenous Given 07/15/18 1053)  HYDROmorphone (DILAUDID) injection 1 mg (1 mg Intravenous Given 07/15/18 1349)  promethazine (PHENERGAN) injection 12.5 mg (12.5 mg Intravenous Given 07/15/18 1349)     Initial Impression / Assessment and Plan / ED Course  I have reviewed the triage vital signs and the nursing notes.  Pertinent labs &  imaging results that were available during my care of the patient were reviewed by me and considered in my medical decision  making (see chart for details).   Pt's headache is still there, so she was given more fluids and pain meds.  She said her pcp told her to get a LP, so we did perform this procedure.  Results are still pending at shift change.  She will be signed out to Dr. Wilson Singer.   Final Clinical Impressions(s) / ED Diagnoses   Final diagnoses:  Acute intractable headache, unspecified headache type    ED Discharge Orders    None       Isla Pence, MD 07/15/18 1552

## 2018-07-15 NOTE — ED Provider Notes (Signed)
Assumed care in sign out. Intractable headache at this point. Work-up has been unrevealing. Prior MRI fine. ESR normal. CSF today with just mild elevation in protein of unclear significance. Still not feeling better after morphine/dilaudid/phenergan. Will try toradol, mag and caffeine.   Feeling much better after meds. Neurology FU.    Virgel Manifold, MD 07/19/18 [phone]

## 2018-07-15 NOTE — ED Triage Notes (Signed)
Pt endorses severe headache x 6 weeks, has had CT scan and MRI that have been "negative" Pt sent to Neurologist and "he said my BP was high" Started on inderal and propanalol "But that is not helping my pain or BP" No neuro deficits.

## 2018-07-15 NOTE — ED Notes (Signed)
Dinner tray was ordered at [employer]

## 2018-07-16 ENCOUNTER — Telehealth: Payer: Self-pay | Admitting: Neurology

## 2018-07-16 NOTE — Telephone Encounter (Signed)
I called the patient.  The patient went to the emergency room yesterday, she apparently had a lumbar puncture that was unremarkable.  The patient was placed on Topamax taking 25 mg twice daily, for some reason she was taken off the propranolol her blood pressure was 737 systolic when she went to the ER.  The purpose of the propranolol is not solely for blood pressure is also treat headache.  Patient will go back on propranolol, ramp up on the dose as planned to 40 mg twice daily.  She will stay on the Topamax for now.

## 2018-07-16 NOTE — Telephone Encounter (Signed)
Pts requesting a call stating that yesterday  in the ED her BP was almost at 200- told her to not take medication propranolol (INDERAL) 20 MG tablet and traMADol (ULTRAM) 50 MG tablet and to start topiramate (TOPAMAX) 25 MG tablet pt unsure which medication she is to take now. Requesting a call to discuss what she should do moving forward stating the pain in her head is unbearable.

## 2018-07-18 ENCOUNTER — Telehealth: Payer: Self-pay | Admitting: Neurology

## 2018-07-18 DIAGNOSIS — Z6829 Body mass index (BMI) 29.0-29.9, adult: Secondary | ICD-10-CM | POA: Diagnosis not present

## 2018-07-18 DIAGNOSIS — G43C1 Periodic headache syndromes in child or adult, intractable: Secondary | ICD-10-CM | POA: Diagnosis not present

## 2018-07-18 DIAGNOSIS — R030 Elevated blood-pressure reading, without diagnosis of hypertension: Secondary | ICD-10-CM | POA: Diagnosis not present

## 2018-07-18 DIAGNOSIS — R836 Abnormal cytological findings in cerebrospinal fluid: Secondary | ICD-10-CM | POA: Diagnosis not present

## 2018-07-18 DIAGNOSIS — G4489 Other headache syndrome: Secondary | ICD-10-CM

## 2018-07-18 DIAGNOSIS — M5030 Other cervical disc degeneration, unspecified cervical region: Secondary | ICD-10-CM | POA: Diagnosis not present

## 2018-07-18 NOTE — Telephone Encounter (Signed)
Spoke with [employer]. She sts. Dr. Abner Greenspan is concerned about results of LP that were done in the ER on 09/14/17.  Will ask Dr. Jannifer Franklin to review and let me know if pt. needs to be seen tomorrow/fim

## 2018-07-18 NOTE — Telephone Encounter (Signed)
Anderson Malta @ Dr Perini's office([facility]) has called stating that Dr Joylene Draft wants pt seen by Dr Jannifer Franklin on tomorrow(she was informed Dr Tobey Grim next office visit is in 01-2019) she states Dr wants pt seen because she went to ED had LP.  Anderson Malta is asking for a call from RN, she has asked message be sent with high priority

## 2018-07-18 NOTE — Telephone Encounter (Signed)
I called the patient.  The lumbar puncture was normal with exception of a modest elevation in the spinal fluid protein.  This is very nonspecific, could potentially even be related to spondylosis of the spine.  The peripheral white blood count however was elevated at 15.  The patient has not been on any steroids.  The sedimentation rate that we checked was normal, C-reactive protein was slightly elevated.  I will check some further blood work at this time.  I am not sure that the elevated CSF protein is related to her headaches.  The headaches continue, she will call me next week if the headache persists and we will continue to go up on the propranolol dose.  She is checking blood pressures at home.

## 2018-07-18 NOTE — Addendum Note (Signed)
Addended by: Kathrynn Ducking on: 07/18/2018 12:56 PM   Modules accepted: Orders

## 2018-07-19 ENCOUNTER — Other Ambulatory Visit (INDEPENDENT_AMBULATORY_CARE_PROVIDER_SITE_OTHER): Payer: Self-pay

## 2018-07-19 DIAGNOSIS — Z0289 Encounter for other administrative examinations: Secondary | ICD-10-CM

## 2018-07-19 DIAGNOSIS — G4489 Other headache syndrome: Secondary | ICD-10-CM

## 2018-07-19 LAB — CSF CULTURE W GRAM STAIN

## 2018-07-19 LAB — CSF CULTURE
CULTURE: NO GROWTH
GRAM STAIN: NONE SEEN
SPECIAL REQUESTS: NORMAL

## 2018-07-22 MED ORDER — CHLORPROMAZINE HCL 100 MG PO TABS: 100.0000 mg | ORAL_TABLET | Freq: Two times a day (BID) | ORAL | 0 refills | Status: DC

## 2018-07-22 MED ORDER — VERAPAMIL HCL ER 120 MG PO TBCR: 120.0000 mg | EXTENDED_RELEASE_TABLET | Freq: Every day | ORAL | 2 refills | Status: DC

## 2018-07-22 NOTE — Addendum Note (Signed)
Addended by: Kathrynn Ducking on: 07/22/2018 11:44 AM   Modules accepted: Orders

## 2018-07-22 NOTE — Telephone Encounter (Signed)
Spoke with pt. and sched. f/u appt. 08/06/18, arrival time of 1130 for a 1200 appt.  Appt. offered 07/23/18 at 1200, but pt. has a mammogram sched. 11/26 at 1pm, and it was felt that the 08/06/18 appt. would allow tiime for any benefit from the med changes to be realized/fim

## 2018-07-22 NOTE — Telephone Encounter (Signed)
I called the patient.  The patient continues to have ongoing significant headaches.  Her blood pressures are still ranging from the 150s to 110 range systolic.  She believes that the propranolol is causing nausea, she has gone off of the Topamax.  She has been given hydrocodone to take by her primary care doctor.  She has Zofran.  The headaches that she claims are not positional in nature, she has now developed vertigo.  The patient will taper off the propranolol going to 20 mg twice daily for 5 days and then stop.  We will start verapamil 120 mg daily.  The patient will be placed on a 3-day course of Thorazine, 100 mg twice daily.  We will get a work in revisit.

## 2018-07-22 NOTE — Telephone Encounter (Signed)
Pts husband ([place]) called stating that his wife is still in pain due to increasing h/a. Stating the pt has felt dizzy once again. Requesting a call discuss the pt coming in to see Dr. Jannifer Franklin or his recommendations.

## 2018-07-23 DIAGNOSIS — Z1231 Encounter for screening mammogram for malignant neoplasm of breast: Secondary | ICD-10-CM | POA: Diagnosis not present

## 2018-07-24 ENCOUNTER — Telehealth: Payer: Self-pay | Admitting: *Deleted

## 2018-07-24 LAB — B. BURGDORFI ANTIBODIES

## 2018-07-24 LAB — PAN-ANCA
ANCA Proteinase 3: <3.5 U/mL (ref 0.0–3.5)
C-ANCA: <1:20 {titer}
Myeloperoxidase Ab: <9 U/mL (ref 0.0–9.0)
P-ANCA: <1:20 {titer}

## 2018-07-24 LAB — ANGIOTENSIN CONVERTING ENZYME: Angio Convert Enzyme: 45 U/L (ref 14–82)

## 2018-07-24 LAB — RHEUMATOID FACTOR

## 2018-07-24 LAB — ANA W/REFLEX: ANA: NEGATIVE

## 2018-07-24 NOTE — Telephone Encounter (Signed)
Events noted

## 2018-07-24 NOTE — Telephone Encounter (Signed)
Spoke to pt and gave her results of labs as unremarkable.  She verbalized understanding. She stated her Bp is normal and her headache is gone today.  She is taking propranolol until Friday and starts verapamil on Saturday.  Thorazine last dose is tomorrow early am.  She is pleased and will let us know how she does.

## 2018-07-26 ENCOUNTER — Emergency Department (HOSPITAL_COMMUNITY)
Admission: EM | Admit: 2018-07-26 | Discharge: 2018-07-26 | Disposition: A | Discharge status: Home / Self Care | Payer: Medicare Other | Attending: Emergency Medicine | Admitting: Emergency Medicine

## 2018-07-26 ENCOUNTER — Other Ambulatory Visit: Payer: Self-pay

## 2018-07-26 ENCOUNTER — Encounter (HOSPITAL_COMMUNITY): Payer: Self-pay | Admitting: Emergency Medicine

## 2018-07-26 DIAGNOSIS — R51 Headache: Secondary | ICD-10-CM | POA: Diagnosis present

## 2018-07-26 DIAGNOSIS — Z87891 Personal history of nicotine dependence: Secondary | ICD-10-CM | POA: Insufficient documentation

## 2018-07-26 DIAGNOSIS — G43809 Other migraine, not intractable, without status migrainosus: Secondary | ICD-10-CM | POA: Diagnosis not present

## 2018-07-26 DIAGNOSIS — J45909 Unspecified asthma, uncomplicated: Secondary | ICD-10-CM | POA: Insufficient documentation

## 2018-07-26 DIAGNOSIS — Z91040 Latex allergy status: Secondary | ICD-10-CM | POA: Insufficient documentation

## 2018-07-26 DIAGNOSIS — Z79899 Other long term (current) drug therapy: Secondary | ICD-10-CM | POA: Insufficient documentation

## 2018-07-26 MED ORDER — VALPROATE SODIUM 500 MG/5ML IV SOLN: 500.0000 mg | Freq: Once | INTRAVENOUS | Status: DC

## 2018-07-26 MED ORDER — VALPROATE SODIUM 500 MG/5ML IV SOLN: 1000.0000 mg | Freq: Once | INTRAVENOUS | Status: DC

## 2018-07-26 MED ORDER — METHYLPREDNISOLONE SODIUM SUCC 40 MG IJ SOLR
40.0000 mg | Freq: Once | INTRAMUSCULAR | Status: AC
  Administered 2018-07-26: 40 mg via INTRAVENOUS
  Filled 2018-07-26: qty 1

## 2018-07-26 MED ORDER — ONDANSETRON 4 MG PO TBDP
4.0000 mg | ORAL_TABLET | Freq: Once | ORAL | Status: AC | PRN
  Administered 2018-07-26: 4 mg via ORAL
  Filled 2018-07-26: qty 1

## 2018-07-26 MED ORDER — VALPROATE SODIUM 500 MG/5ML IV SOLN
1000.0000 mg | Freq: Once | INTRAVENOUS | Status: AC
  Administered 2018-07-26: 1000 mg via INTRAVENOUS
  Filled 2018-07-26: qty 10

## 2018-07-26 NOTE — ED Triage Notes (Signed)
Pt complaint of ongoing headache for 7-8 weeks of unknown cause; provider sent "for depakote infusion."

## 2018-07-26 NOTE — ED Notes (Signed)
ED Provider at bedside. 

## 2018-07-26 NOTE — ED Notes (Signed)
Pt is on the phone with another Dr.s office and she does not want her vital signs rechecked at this time. Pt is miserable and states she can't keep going on like this and is having trouble waiting. Pt will let tech know if she would like vital signs done at a later time.

## 2018-07-26 NOTE — ED Provider Notes (Signed)
[facility] DEPT Provider Note   CSN: [id number] Arrival date & time: 07/26/18  1325     History   Chief Complaint Chief Complaint  Patient presents with  . Headache    HPI Katherine Greer is a 67 y.o. female.  The history is provided by the patient and the spouse. No language interpreter was used.  Headache   This is a recurrent problem. Episode onset: weeks  The problem occurs constantly. The problem has been gradually worsening. The headache is associated with nothing. The pain is moderate. The pain does not radiate. She has tried nothing for the symptoms. The treatment provided no relief.    Past Medical History:  Diagnosis Date  . Allergy   . Arthritis   . Asthma   . Cataract   . Colon polyp   . GERD (gastroesophageal reflux disease)   . Heart murmur   . Osteopenia   . Thyroid disease   . UTI (urinary tract infection)     Patient Active Problem List   Diagnosis Date Noted  . Gastroesophageal reflux disease 05/03/2018  . History of colonic polyps 05/03/2018  . SOB (shortness of breath) 05/03/2018  . Hemorrhoids 05/03/2018  . Abdominal bloating 05/03/2018  . Pill dysphagia 05/03/2018    Past Surgical History:  Procedure Laterality Date  . FOOT SURGERY Bilateral      OB History   None      Home Medications    Prior to Admission medications   Medication Sig Start Date End Date Taking? Authorizing Provider  acetaminophen (TYLENOL) 500 MG tablet Take 1,000 mg by mouth every 6 (six) hours as needed for mild pain or moderate pain.    Yes [provider]  atorvastatin (LIPITOR) 40 MG tablet Take 40 mg by mouth daily. 02/03/18  Yes [provider]  Cholecalciferol 2000 units CAPS Take 2,000 Units by mouth daily. 02/08/15  Yes [provider]  dextromethorphan-guaiFENesin (MUCINEX DM) 30-600 MG 12hr tablet Take 1 tablet by mouth 2 (two) times daily.   Yes [provider]  HYDROcodone-acetaminophen  (NORCO/VICODIN) 5-325 MG tablet Take 2 tablets by mouth daily as needed for headache. 07/18/18  Yes [provider]  ibuprofen (ADVIL,MOTRIN) 200 MG tablet Take 400 mg by mouth every 6 (six) hours as needed for mild pain or moderate pain.   Yes [provider]  levothyroxine (SYNTHROID, LEVOTHROID) 112 MCG tablet Take 112 mcg by mouth daily before breakfast.   Yes [provider]  nicotine polacrilex (COMMIT) 2 MG lozenge Take 2 mg by mouth as needed for smoking cessation.   Yes [provider]  omeprazole (PRILOSEC) 20 MG capsule Take 20 mg by mouth daily. 06/04/17  Yes [provider]  ondansetron (ZOFRAN-ODT) 8 MG disintegrating tablet Take 8 mg by mouth daily as needed. 07/18/18  Yes [provider]  OVER THE COUNTER MEDICATION Volteran Gel, As needed.   Yes [provider]  propranolol (INDERAL) 20 MG tablet Take 20 mg by mouth.   Yes [provider]  senna (SENOKOT) 8.6 MG tablet Take 2 tablets by mouth daily.   Yes [provider]  spironolactone (ALDACTONE) 50 MG tablet Take 100 mg by mouth daily.  02/22/18  Yes [provider]  tolterodine (DETROL LA) 4 MG 24 hr capsule Take 4 mg by mouth daily. 02/15/15  Yes [provider]  topiramate (TOPAMAX) 25 MG tablet Take 25 mg by mouth 2 (two) times daily.   Yes [provider]  traMADol (ULTRAM) 50 MG tablet Take 1 tablet (50 mg total) by mouth every 6 (six) hours as needed. Patient taking differently: Take 50 mg by mouth 2 (two) times daily as needed for moderate pain.  07/11/18  Yes Kathrynn Ducking, MD  chlorproMAZINE (THORAZINE) 100 MG tablet Take 1 tablet (100 mg total) by mouth 2 (two) times daily. Patient not taking: Reported on 07/26/2018 07/22/18   Kathrynn Ducking, MD  verapamil (CALAN-SR) 120 MG CR tablet Take 1 tablet (120 mg total) by mouth at bedtime. 07/22/18   Kathrynn Ducking, MD    Family History Family History    Problem Relation Age of Onset  . Cancer Son        Pituitary- Metastatic Brain  . Heart attack Mother   . Diabetes Father   . Chronic Renal Failure Father   . Lung cancer Father   . Breast cancer Sister   . Diabetes Brother   . Colon cancer Neg Hx   . Esophageal cancer Neg Hx   . Inflammatory bowel disease Neg Hx   . Liver disease Neg Hx   . Pancreatic cancer Neg Hx   . Stomach cancer Neg Hx   . Rectal cancer Neg Hx     Social History Social History   Tobacco Use  . Smoking status: Former Research scientist (life sciences)  . Smokeless tobacco: Never Used  . Tobacco comment: Quit 2015  Substance Use Topics  . Alcohol use: Yes    Frequency: Never    Comment: wine 4 times a week  . Drug use: Never     Allergies   Augmentin [amoxicillin-pot clavulanate]; Keflex [cephalexin]; Levaquin [levofloxacin in d5w]; Sulfa antibiotics; and Latex   Review of Systems Review of Systems  Neurological: Positive for headaches.  All other systems reviewed and are negative.    Physical Exam Updated Vital Signs BP (!) 181/92   Pulse 62   Temp (!) 97.5 F (36.4 C) (Oral)   Resp 18   Ht 5' 6.75" (1.695 m)   Wt 84.4 kg   SpO2 96%   BMI 29.35 kg/m   Physical Exam  Constitutional: She appears well-developed and well-nourished.  HENT:  Head: Normocephalic and atraumatic.  Eyes: Pupils are equal, round, and reactive to light.  Cardiovascular: Normal rate.  Pulmonary/Chest: Effort normal.  Abdominal: Soft.  Neurological: She is alert. She has normal strength.  Skin: Skin is warm.  Psychiatric: She has a normal mood and affect.  Nursing note and vitals reviewed.    ED Treatments / Results  Labs (all labs ordered are listed, but only abnormal results are displayed) Labs Reviewed - No data to display  EKG None  Radiology No results found.  Procedures Procedures (including critical care time)  Medications Ordered in ED Medications  ondansetron (ZOFRAN-ODT) disintegrating tablet 4 mg (4 mg  Oral Given 07/26/18 1351)  methylPREDNISolone sodium succinate (SOLU-MEDROL) 40 mg/mL injection 40 mg (40 mg Intravenous Given 07/26/18 1739)  valproate (DEPACON) 1,000 mg in dextrose 5 % 50 mL IVPB (0 mg Intravenous Stopped 07/26/18 1956)     Initial Impression / Assessment and Plan / ED Course  I have reviewed the triage vital signs and the nursing notes.  Pertinent labs & imaging results that were available during my care of the patient were reviewed by me and considered in my medical decision making (see chart for details).    MDM  Pt has normal ct head, LP on last visit.  Pt has seen her  primary care and Dr. Jannifer Franklin.  She called Neurology on call due to continued headache today.  I spoke to Dr. Brett Fairy who request pt receive depakote 1grm IV and solumedrol 40 IV.   Pt reports feeling better.  Pt looks good, pt talkative, sitting up, smiling, pleasant.  Pt advised to continue her medications as directed   Final Clinical Impressions(s) / ED Diagnoses   Final diagnoses:  Other migraine without status migrainosus, not intractable    ED Discharge Orders    None       Fransico Meadow, Hershal Coria 07/26/18 2052    Francine Graven, DO 07/29/18 [phone]

## 2018-07-26 NOTE — Discharge Instructions (Signed)
See your Physician for recheck next week  °

## 2018-07-26 NOTE — ED Notes (Signed)
Complaint of nausea with headache; zofran administered.

## 2018-07-29 ENCOUNTER — Telehealth: Payer: Self-pay | Admitting: Neurology

## 2018-07-29 MED ORDER — CHLORPROMAZINE HCL 100 MG PO TABS: 100.0000 mg | ORAL_TABLET | Freq: Two times a day (BID) | ORAL | 0 refills | Status: DC

## 2018-07-29 NOTE — Telephone Encounter (Signed)
Message printed and given to [facility] in the infusion suite/fim

## 2018-07-29 NOTE — Telephone Encounter (Signed)
I called the patient.  The patient got good benefit from an injection of Depacon with Solu-Medrol.  She will come in for an injection of Depacon and 125 Solu-Medrol today.  She will be given a prescription for Thorazine to extend the dose taking 100 mg twice daily, this seemed to help previously with a 3-day course.  The patient will take it for 1 week.  The patient will go back on the Topamax, she only gave this medication a several day trial.  She will remain on verapamil, stop the propranolol which she claims she could not tolerate.

## 2018-07-29 NOTE — Telephone Encounter (Signed)
Patient calling to schedule an appointment for today for an injection per previous message.

## 2018-07-29 NOTE — Telephone Encounter (Signed)
patient with Headaches for 2 month- had spinal tap, multiple meds tried, but none for the long term.  Send her to ED at [facility] long to try a depakote IV pulse infusion with steroids. She had to wait 4 hours , finally got seen by PA to whom I spoke. Patient had no further calls.  Phone contact on Friday , 11 Am, 16.45 Pm and 17.20 Pm.

## 2018-07-29 NOTE — Telephone Encounter (Signed)
I spoke with Katherine Greer this morning. She sts. she went to the ER, had one infusion (Depakote 1g, Solumedrol 40mg ) and sts. h/a's are "considerably better."  Sts. prior to infusion, h/a was 11/10 on pain scale; today sts. h/a is 2/10 on pain scale. She wonders if she needs to have a 2nd infusion. Also, she has now stopped the Inderal and started Verapamil yesterday. BP yesterday was 136/62. Will pass this update along to Dr. Jannifer Franklin. Pt. would like a return call at [phone]./fim

## 2018-08-06 ENCOUNTER — Ambulatory Visit (INDEPENDENT_AMBULATORY_CARE_PROVIDER_SITE_OTHER): Payer: Medicare Other | Admitting: Neurology

## 2018-08-06 ENCOUNTER — Encounter: Payer: Self-pay | Admitting: Neurology

## 2018-08-06 VITALS — BP 130/81 | HR 84 | Ht 66.0 in | Wt 188.0 lb

## 2018-08-06 DIAGNOSIS — G4489 Other headache syndrome: Secondary | ICD-10-CM | POA: Diagnosis not present

## 2018-08-06 MED ORDER — METOCLOPRAMIDE HCL 5 MG PO TABS: 5.0000 mg | ORAL_TABLET | Freq: Three times a day (TID) | ORAL | 2 refills | Status: DC

## 2018-08-06 MED ORDER — DIVALPROEX SODIUM 500 MG PO DR TAB: 500.0000 mg | DELAYED_RELEASE_TABLET | Freq: Two times a day (BID) | ORAL | 2 refills | Status: DC

## 2018-08-06 NOTE — Patient Instructions (Addendum)
Stop the Topamax, start Depakote for the headache. Begin one a day for 1 week, then take one twice a day.    We will start Regan for the nausea.  Depakote (valproic acid) is a seizure medication that also has an FDA approval for migraine headache. The most common potential side effects of this medication include weight gain, tremor, or possible stomach upset. This medication can potentially cause liver problems. If confusion is noted on this medication, contact our office immediately.

## 2018-08-06 NOTE — Progress Notes (Signed)
Reason for visit: Headache  Katherine Greer is an 67 y.o. female  History of present illness:  Katherine Greer is a 67 year old right-handed white female with a history of intractable headache.  The patient has been having headaches for about 2 and half months, she did not have any significant headaches prior to this.  She does recall that when she was around 67 years old she had sudden onset of headaches that occurred following a blackout event while she was at the beach.  She had headaches for about 6 weeks and then the headaches gradually went away.  She had an extensive work-up at that time that also included a lumbar puncture, she was seen through [facility], she was seen by infectious disease doctors, the source of the headache was never determined.  The patient is on Topamax but she has significant problems with gastroesophageal reflux disease and the Topamax is upsetting her stomach, she is having daily nausea.  She was given a trial on Thorazine for 7 days, but this did not make her feel well and did not help the headache or nausea.  The patient has gained benefit with a Depacon injection and Solu-Medrol injection.  The patient is now on verapamil 120 mg daily.  Her blood pressures have slowly improved, but she is still running blood pressures in the 024 range systolic.  Her pulse has gone up coming off of the propranolol.  She returns for an evaluation.  Past Medical History:  Diagnosis Date  . Allergy   . Arthritis   . Asthma   . Cataract   . Colon polyp   . GERD (gastroesophageal reflux disease)   . Heart murmur   . Osteopenia   . Thyroid disease   . UTI (urinary tract infection)     Past Surgical History:  Procedure Laterality Date  . FOOT SURGERY Bilateral     Family History  Problem Relation Age of Onset  . Cancer Son        Pituitary- Metastatic Brain  . Heart attack Mother   . Diabetes Father   . Chronic Renal Failure Father   . Lung cancer Father   . Breast  cancer Sister   . Diabetes Brother   . Colon cancer Neg Hx   . Esophageal cancer Neg Hx   . Inflammatory bowel disease Neg Hx   . Liver disease Neg Hx   . Pancreatic cancer Neg Hx   . Stomach cancer Neg Hx   . Rectal cancer Neg Hx     Social history:  reports that she has quit smoking. She has never used smokeless tobacco. She reports that she drinks alcohol. She reports that she does not use drugs.    Allergies  Allergen Reactions  . Augmentin [Amoxicillin-Pot Clavulanate] Anaphylaxis    Rash Has patient had a PCN reaction causing immediate rash, facial/tongue/throat swelling, SOB or lightheadedness with hypotension: No Has patient had a PCN reaction causing severe rash involving mucus membranes or skin necrosis: Yes Has patient had a PCN reaction that required hospitalization: No Has patient had a PCN reaction occurring within the last 10 years: No If all of the above answers are "NO", then may proceed with Cephalosporin use.   Marland Kitchen Keflex [Cephalexin] Anaphylaxis  . Levaquin [Levofloxacin In D5w] Anaphylaxis  . Sulfa Antibiotics Anaphylaxis  . Latex Itching    Medications:  Prior to Admission medications   Medication Sig Start Date End Date Taking? Authorizing Provider  atorvastatin (LIPITOR) 40 MG  tablet Take 40 mg by mouth daily. 02/03/18  Yes [provider]  Cholecalciferol 2000 units CAPS Take 2,000 Units by mouth daily. 02/08/15  Yes [provider]  HYDROcodone-acetaminophen (NORCO/VICODIN) 5-325 MG tablet Take 2 tablets by mouth daily as needed for headache. 07/18/18  Yes [provider]  ibuprofen (ADVIL,MOTRIN) 200 MG tablet Take 400 mg by mouth every 6 (six) hours as needed for mild pain or moderate pain.   Yes [provider]  levothyroxine (SYNTHROID, LEVOTHROID) 112 MCG tablet Take 112 mcg by mouth daily before breakfast.   Yes [provider]  nicotine polacrilex (COMMIT) 2 MG lozenge Take 2 mg by mouth as needed for  smoking cessation.   Yes [provider]  omeprazole (PRILOSEC) 20 MG capsule Take 20 mg by mouth daily. 06/04/17  Yes [provider]  ondansetron (ZOFRAN-ODT) 8 MG disintegrating tablet Take 8 mg by mouth daily as needed. 07/18/18  Yes [provider]  OVER THE COUNTER MEDICATION Volteran Gel, As needed.   Yes [provider]  senna (SENOKOT) 8.6 MG tablet Take 2 tablets by mouth daily.   Yes [provider]  spironolactone (ALDACTONE) 50 MG tablet Take 100 mg by mouth daily.  02/22/18  Yes [provider]  tolterodine (DETROL LA) 4 MG 24 hr capsule Take 4 mg by mouth daily. 02/15/15  Yes [provider]  topiramate (TOPAMAX) 25 MG tablet Take 25 mg by mouth 2 (two) times daily.   Yes [provider]  verapamil (CALAN-SR) 120 MG CR tablet Take 1 tablet (120 mg total) by mouth at bedtime. 07/22/18  Yes Kathrynn Ducking, MD  acetaminophen (TYLENOL) 500 MG tablet Take 1,000 mg by mouth every 6 (six) hours as needed for mild pain or moderate pain.     [provider]  dextromethorphan-guaiFENesin (MUCINEX DM) 30-600 MG 12hr tablet Take 1 tablet by mouth 2 (two) times daily.    [provider]  traMADol (ULTRAM) 50 MG tablet Take 1 tablet (50 mg total) by mouth every 6 (six) hours as needed. Patient not taking: Reported on 08/06/2018 07/11/18   Kathrynn Ducking, MD    ROS:  Out of a complete 14 system review of symptoms, the patient complains only of the following symptoms, and all other reviewed systems are negative.  Headache Nausea Neck pain  Blood pressure 130/81, pulse 84, height 5\' 6"  (1.676 m), weight 188 lb (85.3 kg).  Physical Exam  General: The patient is alert and cooperative at the time of the examination.  Skin: No significant peripheral edema is noted.   Neurologic Exam  Mental status: The patient is alert and oriented x 3 at the time of the examination. The patient has apparent  normal recent and remote memory, with an apparently normal attention span and concentration ability.   Cranial nerves: Facial symmetry is present. Speech is normal, no aphasia or dysarthria is noted. Extraocular movements are full. Visual fields are full.  Pupils are equal, round, and reactive to light.  Discs are flat bilaterally.  Motor: The patient has good strength in all 4 extremities.  Sensory examination: Soft touch sensation is symmetric on the face, arms, and legs.  Coordination: The patient has good finger-nose-finger and heel-to-shin bilaterally.  Gait and station: The patient has a normal gait. Tandem gait is slightly unsteady. Romberg is negative. No drift is seen.  Reflexes: Deep tendon reflexes are symmetric.   Assessment/Plan:  1.  Intractable headache  The patient is not  tolerating the Topamax, we will stop the drug and start Depakote taking 500 mg daily for a week and then go to 500 mg twice daily, she will remain on verapamil but this dose may be increased in the future.  The patient will follow-up in 6 weeks, she will be placed on Reglan 5 mg 3 times daily for the nausea.   Jill Alexanders MD 08/06/2018 12:56 PM  [facility] [address] [place], [place] [address]  Phone [phone] Fax [phone]

## 2018-08-08 ENCOUNTER — Telehealth: Payer: Self-pay

## 2018-08-08 NOTE — Telephone Encounter (Signed)
Katherine Greer was in the office recently with her husband for his visit with Dr. Burt Knack. At his visit, she asked Dr. Burt Knack if he would be her Cardiologist as she has high blood pressure. Called to arrange appointment with patient.  Left message to call back.

## 2018-08-12 ENCOUNTER — Telehealth: Payer: Self-pay | Admitting: Neurology

## 2018-08-12 MED ORDER — ONDANSETRON 4 MG PO TBDP: 4.0000 mg | ORAL_TABLET | Freq: Three times a day (TID) | ORAL | 3 refills | Status: DC | PRN

## 2018-08-12 MED ORDER — GABAPENTIN 100 MG PO CAPS: ORAL_CAPSULE | ORAL | 3 refills | Status: DC

## 2018-08-12 NOTE — Telephone Encounter (Signed)
Dr. Jannifer Franklin, see message and please advise on how to proceed.

## 2018-08-12 NOTE — Telephone Encounter (Signed)
I called the patient.  The patient could not tolerate Depakote, she had significant nausea on this, she has discontinued the medication.  She felt that metoclopramide caused too much drowsiness, she has stopped this as well.  The patient will be given Zofran ODT tablets, she will be started on gabapentin in low-dose.  The patient be set up for another Depacon injection with Solu-Medrol.

## 2018-08-12 NOTE — Telephone Encounter (Signed)
Pt requesting a call stating that since starting medication divalproex (DEPAKOTE) 500 MG DR tablet she hasn't been able to keep anything down(has lost about 8 pounds). Stating with Christmas coming up she isn't sure what to do. Pt feels "there is a whole in my stomach" stating she is very uncomfortable, she has stopped medication as of right now.

## 2018-08-12 NOTE — Addendum Note (Signed)
Addended by: Kathrynn Ducking on: 08/12/2018 02:35 PM   Modules accepted: Orders

## 2018-08-15 NOTE — Telephone Encounter (Signed)
Left message to call back  

## 2018-08-15 NOTE — Telephone Encounter (Signed)
Mrs. Mestas states she is feeling much better since her headaches are better controlled. Her BP is still elevated for her (130s/60s). When asked if she still needed to establish with Dr. Burt Knack, she said yes because she has a murmur, HTN, and hyperlipidemia. She states she had a cardiologist at [place] (records available in [place]) but now wants to follow in [place]. She is 3-4 months overdue for follow-up.   Offered her an appointment with Dr. Burt Knack in February (first available spot). She would like to be seen sooner since she already overdue for evaluation. She is happy to establish with another cardiologist.   Scheduled her with Dr. Meda Coffee September 09, 2018 for new patient evaluation.  She was grateful for assistance.   Will send message to [facility] [employer] to retrieve PCP records.

## 2018-08-16 DIAGNOSIS — G43011 Migraine without aura, intractable, with status migrainosus: Secondary | ICD-10-CM | POA: Diagnosis not present

## 2018-08-26 DIAGNOSIS — H43813 Vitreous degeneration, bilateral: Secondary | ICD-10-CM | POA: Diagnosis not present

## 2018-09-09 ENCOUNTER — Ambulatory Visit (INDEPENDENT_AMBULATORY_CARE_PROVIDER_SITE_OTHER): Payer: Medicare Other | Admitting: Cardiology

## 2018-09-09 VITALS — BP 170/90 | HR 82 | Ht 66.0 in | Wt 191.6 lb

## 2018-09-09 DIAGNOSIS — E785 Hyperlipidemia, unspecified: Secondary | ICD-10-CM

## 2018-09-09 DIAGNOSIS — I10 Essential (primary) hypertension: Secondary | ICD-10-CM | POA: Diagnosis not present

## 2018-09-09 DIAGNOSIS — R0609 Other forms of dyspnea: Secondary | ICD-10-CM | POA: Diagnosis not present

## 2018-09-09 DIAGNOSIS — R072 Precordial pain: Secondary | ICD-10-CM | POA: Diagnosis not present

## 2018-09-09 MED ORDER — CARVEDILOL 6.25 MG PO TABS: 6.2500 mg | ORAL_TABLET | Freq: Two times a day (BID) | ORAL | 2 refills | Status: DC

## 2018-09-09 NOTE — Patient Instructions (Signed)
Medication Instructions:   START TAKING CARVEDILOL 6.25 MG BY MOUTH TWICE DAILY  If you need a refill on your cardiac medications before your next appointment, please call your pharmacy.     Lab work:  [place], PRIOR TO WHEN YOUR CORONARY CT IS SCHEDULED--TO CHECK A BMET   If you have labs (blood work) drawn today and your tests are completely normal, you will receive your results only by: Marland Kitchen MyChart Message (if you have MyChart) OR . A paper copy in the mail If you have any lab test that is abnormal or we need to change your treatment, we will call you to review the results.   Testing/Procedures:  Your physician has requested that you have an echocardiogram. Echocardiography is a painless test that uses sound waves to create images of your heart. It provides your doctor with information about the size and shape of your heart and how well your heart's chambers and valves are working. This procedure takes approximately one hour. There are no restrictions for this procedure.   CORONARY CT  Please arrive at the [facility] main entrance of [facility] at xx:xx AM (30-45 minutes prior to test start time)  [facility] [place], [place] [address] [phone]  Proceed to the [facility] Radiology Department (First Floor).  Please follow these instructions carefully (unless otherwise directed):   On the Night Before the Test: . Be sure to Drink plenty of water. . Do not consume any caffeinated/decaffeinated beverages or chocolate 12 hours prior to your test. . Do not take any antihistamines 12 hours prior to your test.  On the Day of the Test: . Drink plenty of water. Do not drink any water within one hour of the test. . Do not eat any food 4 hours prior to the test. . You may take your regular medications prior to the test.  . Take CARVEDILOL 6.25 MG BY MOUTH  two hours prior to test.        After the Test: . Drink plenty of  water. . After receiving IV contrast, you may experience a mild flushed feeling. This is normal. . On occasion, you may experience a mild rash up to 24 hours after the test. This is not dangerous. If this occurs, you can take Benadryl 25 mg and increase your fluid intake. . If you experience trouble breathing, this can be serious. If it is severe call 911 IMMEDIATELY. If it is mild, please call our office.     Follow-Up:  3 MONTHS WITH DR Meda Coffee

## 2018-09-09 NOTE — Progress Notes (Signed)
Cardiology Office Note:    Date:  09/09/2018   ID:  Katherine Greer, DOB [birth year], MRN [id number]  PCP:  Katherine Infante, MD  Cardiologist:  No primary care provider on file.  Electrophysiologist:  None   Referring MD: Katherine Infante, MD   Chief complaint: Hypertension, headaches, dyspnea on exertion  History of Present Illness:    Katherine Greer is a 68 y.o. female with no significant past medical history until about 3 months ago when she developed intractable headaches with extensive work-up by Dr. Jannifer Greer, including head CT and MRI and lumbar puncture at [facility].  She was tried on multiple medications and she developed significant intolerances to multiple of them she developed intolerances including propranolol, Topamax and Depacon. Her headaches eventually went away but the etiology was never identified.  She is currently on Neurontin and verapamil.  She has also noticed worsening blood pressure, she states she always had low blood pressure so this is new for her.  She has also noticed worsening dyspnea on exertion.  She got married in 2018 and change her active lifestyle to a very sedentary lifestyle, she cooks 3 times a day does not have time to go to gym.  She has gained 20 pounds.  Her mother had myocardial infarction in her 31s.  Past Medical History:  Diagnosis Date  . Abdominal bloating 05/03/2018  . Allergy   . Arthritis   . Asthma   . Cataract   . Colon polyp   . GERD (gastroesophageal reflux disease)   . Heart murmur   . Osteopenia   . Pill dysphagia 05/03/2018  . SOB (shortness of breath) 05/03/2018  . Thyroid disease   . UTI (urinary tract infection)     Past Surgical History:  Procedure Laterality Date  . FOOT SURGERY Bilateral     Current Medications: Current Meds  Medication Sig  . acetaminophen (TYLENOL) 500 MG tablet Take 1,000 mg by mouth every 6 (six) hours as needed for mild pain or moderate pain.   Marland Kitchen atorvastatin (LIPITOR) 40 MG tablet Take 40 mg by mouth  daily.  . Cholecalciferol 2000 units CAPS Take 2,000 Units by mouth daily.  Marland Kitchen dextromethorphan-guaiFENesin (MUCINEX DM) 30-600 MG 12hr tablet Take 1 tablet by mouth as needed.   . gabapentin (NEURONTIN) 100 MG capsule 1 capsule in the morning, 2 in the evening  . ibuprofen (ADVIL,MOTRIN) 200 MG tablet Take 400 mg by mouth every 6 (six) hours as needed for mild pain or moderate pain.  Marland Kitchen levothyroxine (SYNTHROID, LEVOTHROID) 112 MCG tablet Take 112 mcg by mouth daily before breakfast.  . nicotine polacrilex (COMMIT) 2 MG lozenge Take 2 mg by mouth as needed for smoking cessation.  Marland Kitchen omeprazole (PRILOSEC) 20 MG capsule Take 20 mg by mouth daily.  Marland Kitchen OVER THE COUNTER MEDICATION Volteran Gel, As needed.  . senna (SENOKOT) 8.6 MG tablet Take 2 tablets by mouth daily.  Marland Kitchen spironolactone (ALDACTONE) 50 MG tablet Take 100 mg by mouth daily.   Marland Kitchen tolterodine (DETROL LA) 4 MG 24 hr capsule Take 4 mg by mouth daily.  . verapamil (CALAN-SR) 120 MG CR tablet Take 1 tablet (120 mg total) by mouth at bedtime.     Allergies:   Augmentin [amoxicillin-pot clavulanate]; Keflex [cephalexin]; Levaquin [levofloxacin in d5w]; Sulfa antibiotics; Depakote er [divalproex sodium er]; Latex; and Topamax [topiramate]   Social History   Socioeconomic History  . Marital status: Married    Spouse name: Not on file  . Number of children: 2  .  Years of education: Not on file  . Highest education level: Not on file  Occupational History  . 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 Financial resource strain: Not on file  . Food insecurity:    Worry: Not on file    Inability: Not on file  . Transportation needs:    Medical: Not on file    Non-medical: Not on file  Tobacco Use  . Smoking status: Former Research scientist (life sciences)  . Smokeless tobacco: Never Used  . Tobacco comment: Quit 2015  Substance and Sexual Activity  . Alcohol use: Yes    Frequency: Never    Comment: wine 4 times a week  . Drug use: Never    . Sexual activity: Yes  Lifestyle  . Physical activity:    Days per week: Not on file    Minutes per session: Not on file  . Stress: Not on file  Relationships  . Social connections:    Talks on phone: Not on file    Gets together: Not on file    Attends religious service: Not on file    Active member of club or organization: Not on file    Attends meetings of clubs or organizations: Not on file    Relationship status: Not on file  Other Topics Concern  . Not on file  Social History Narrative  . Not on file    Family History: The patient's family history includes Breast cancer in her sister; Cancer in her son; Chronic Renal Failure in her father; Diabetes in her brother and father; Heart attack in her mother; Lung cancer in her father. There is no history of Colon cancer, Esophageal cancer, Inflammatory bowel disease, Liver disease, Pancreatic cancer, Stomach cancer, or Rectal cancer.  ROS:   Please see the history of present illness.    All other systems reviewed and are negative.  EKGs/Labs/Other Studies Reviewed:    The following studies were reviewed today:  EKG:  EKG is  ordered today.  The ekg ordered today demonstrates sinus rhythm, normal EKG, this was personally reviewed.  Recent Labs: 07/15/2018: ALT 19; BUN 23; Creatinine, Ser 1.20; Hemoglobin 13.8; Platelets 363; Potassium 3.9; Sodium 134  Recent Lipid Panel No results found for: CHOL, TRIG, HDL, CHOLHDL, VLDL, LDLCALC, LDLDIRECT  Physical Exam:    VS:  BP (!) 170/90   Pulse 82   Ht 5\' 6"  (1.676 m)   Wt 191 lb 9.6 oz (86.9 kg)   SpO2 98%   BMI 30.93 kg/m     Wt Readings from Last 3 Encounters:  09/09/18 191 lb 9.6 oz (86.9 kg)  08/06/18 188 lb (85.3 kg)  07/26/18 186 lb (84.4 kg)    GEN: Well nourished, well developed in no acute distress HEENT: Normal NECK: No JVD; No carotid bruits LYMPHATICS: No lymphadenopathy CARDIAC: RRR, no murmurs, rubs, gallops RESPIRATORY:  Clear to auscultation without  rales, wheezing or rhonchi  ABDOMEN: Soft, non-tender, non-distended MUSCULOSKELETAL:  No edema; No deformity  SKIN: Warm and dry NEUROLOGIC:  Alert and oriented x 3 PSYCHIATRIC:  Normal affect   ASSESSMENT:    1. DOE (dyspnea on exertion)   2. Essential hypertension   3. Hyperlipidemia, unspecified hyperlipidemia type   4. Precordial pain    PLAN:    In order of problems listed above:  1. We will obtain echocardiogram to evaluate for systolic diastolic function, degree of LVH. 2. We will obtain coronary CTA to evaluate for ischemia. 3.  Her heart rate is fairly high we will add carvedilol 6.25 mg p.o. twice daily. 4. She was advised to start regular exercise 5 times a week for half an hour and cut down on food portions. 5. If her echocardiogram in coronary CTA do not show significant abnormalities we will try to push for conservative management with weight loss increase activity level and eventually hopefully decreasing the dosage of blood pressure medications. 6. To new atorvastatin for hyperlipidemia, she is tolerating it well.   Medication Adjustments/Labs and Tests Ordered: Current medicines are reviewed at length with the patient today.  Concerns regarding medicines are outlined above.  Orders Placed This Encounter  Procedures  . CT CORONARY MORPH W/CTA COR W/SCORE W/CA W/CM &/OR WO/CM  . CT CORONARY FRACTIONAL FLOW RESERVE DATA PREP  . CT CORONARY FRACTIONAL FLOW RESERVE FLUID ANALYSIS  . Basic metabolic panel  . EKG 12-Lead  . ECHOCARDIOGRAM COMPLETE   Meds ordered this encounter  Medications  . carvedilol (COREG) 6.25 MG tablet    Sig: Take 1 tablet (6.25 mg total) by mouth 2 (two) times daily.    Dispense:  180 tablet    Refill:  2    Patient Instructions  Medication Instructions:   START TAKING CARVEDILOL 6.25 MG BY MOUTH TWICE DAILY  If you need a refill on your cardiac medications before your next appointment, please call your pharmacy.     Lab  work:  [place], PRIOR TO WHEN YOUR CORONARY CT IS SCHEDULED--TO CHECK A BMET   If you have labs (blood work) drawn today and your tests are completely normal, you will receive your results only by: Marland Kitchen MyChart Message (if you have MyChart) OR . A paper copy in the mail If you have any lab test that is abnormal or we need to change your treatment, we will call you to review the results.   Testing/Procedures:  Your physician has requested that you have an echocardiogram. Echocardiography is a painless test that uses sound waves to create images of your heart. It provides your doctor with information about the size and shape of your heart and how well your heart's chambers and valves are working. This procedure takes approximately one hour. There are no restrictions for this procedure.   CORONARY CT  Please arrive at the [facility] main entrance of [facility] at xx:xx AM (30-45 minutes prior to test start time)  [facility] [place], [place] [address] [phone]  Proceed to the [facility] Radiology Department (First Floor).  Please follow these instructions carefully (unless otherwise directed):   On the Night Before the Test: . Be sure to Drink plenty of water. . Do not consume any caffeinated/decaffeinated beverages or chocolate 12 hours prior to your test. . Do not take any antihistamines 12 hours prior to your test.  On the Day of the Test: . Drink plenty of water. Do not drink any water within one hour of the test. . Do not eat any food 4 hours prior to the test. . You may take your regular medications prior to the test.  . Take CARVEDILOL 6.25 MG BY MOUTH  two hours prior to test.        After the Test: . Drink plenty of water. . After receiving IV contrast, you may experience a mild flushed feeling. This is normal. . On occasion, you may experience a mild rash up to 24 hours after the test. This is not  dangerous. If  this occurs, you can take Benadryl 25 mg and increase your fluid intake. . If you experience trouble breathing, this can be serious. If it is severe call 911 IMMEDIATELY. If it is mild, please call our office.     Follow-Up:  3 MONTHS WITH DR Kate Sable, Ena Dawley, MD  09/09/2018 4:15 PM    [place]

## 2018-09-12 DIAGNOSIS — E7849 Other hyperlipidemia: Secondary | ICD-10-CM | POA: Diagnosis not present

## 2018-09-12 DIAGNOSIS — E038 Other specified hypothyroidism: Secondary | ICD-10-CM | POA: Diagnosis not present

## 2018-09-12 DIAGNOSIS — M859 Disorder of bone density and structure, unspecified: Secondary | ICD-10-CM | POA: Diagnosis not present

## 2018-09-12 DIAGNOSIS — R82998 Other abnormal findings in urine: Secondary | ICD-10-CM | POA: Diagnosis not present

## 2018-09-17 DIAGNOSIS — E7849 Other hyperlipidemia: Secondary | ICD-10-CM | POA: Diagnosis not present

## 2018-09-18 ENCOUNTER — Telehealth: Payer: Self-pay | Admitting: *Deleted

## 2018-09-18 ENCOUNTER — Other Ambulatory Visit: Payer: Medicare Other | Admitting: *Deleted

## 2018-09-18 ENCOUNTER — Ambulatory Visit (HOSPITAL_COMMUNITY): Payer: Medicare Other | Attending: Cardiology

## 2018-09-18 ENCOUNTER — Other Ambulatory Visit: Payer: Self-pay

## 2018-09-18 DIAGNOSIS — R0609 Other forms of dyspnea: Secondary | ICD-10-CM | POA: Insufficient documentation

## 2018-09-18 DIAGNOSIS — R072 Precordial pain: Secondary | ICD-10-CM

## 2018-09-18 DIAGNOSIS — I10 Essential (primary) hypertension: Secondary | ICD-10-CM | POA: Insufficient documentation

## 2018-09-18 DIAGNOSIS — Z1212 Encounter for screening for malignant neoplasm of rectum: Secondary | ICD-10-CM | POA: Diagnosis not present

## 2018-09-18 DIAGNOSIS — E785 Hyperlipidemia, unspecified: Secondary | ICD-10-CM

## 2018-09-18 DIAGNOSIS — R7989 Other specified abnormal findings of blood chemistry: Secondary | ICD-10-CM

## 2018-09-18 DIAGNOSIS — E875 Hyperkalemia: Secondary | ICD-10-CM

## 2018-09-18 DIAGNOSIS — R0602 Shortness of breath: Secondary | ICD-10-CM

## 2018-09-18 LAB — BASIC METABOLIC PANEL
BUN/Creatinine Ratio: 16 (ref 12–28)
BUN: 24 mg/dL (ref 8–27)
CO2: 20 mmol/L (ref 20–29)
Calcium: 9.9 mg/dL (ref 8.7–10.3)
Chloride: 97 mmol/L (ref 96–106)
Creatinine, Ser: 1.48 mg/dL — ABNORMAL HIGH (ref 0.57–1.00)
GFR calc Af Amer: 41 mL/min/{1.73_m2} — ABNORMAL LOW (ref >59)
GFR calc non Af Amer: 36 mL/min/{1.73_m2} — ABNORMAL LOW (ref >59)
Glucose: 101 mg/dL — ABNORMAL HIGH (ref 65–99)
Potassium: 5.3 mmol/L — ABNORMAL HIGH (ref 3.5–5.2)
Sodium: 135 mmol/L (ref 134–144)

## 2018-09-18 MED ORDER — SPIRONOLACTONE 50 MG PO TABS: 50.0000 mg | ORAL_TABLET | Freq: Every day | ORAL | 1 refills | Status: DC

## 2018-09-18 MED ORDER — FUROSEMIDE 40 MG PO TABS: 40.0000 mg | ORAL_TABLET | Freq: Every day | ORAL | 1 refills | Status: DC

## 2018-09-18 NOTE — Telephone Encounter (Signed)
-----   Message from Dorothy Spark, MD sent at 09/18/2018  4:19 PM EST ----- Normal LVEF, impaired relaxation with elevated filling pressures. I would decrease spironolactone to 50 mg po daily and start lasix 40 mg po daily, repeat BMP in 1 week.

## 2018-09-18 NOTE — Telephone Encounter (Signed)
Notified the pt that per Dr Meda Coffee, her echo and lab showed that she had normal LVEF, impaired relaxation with elevated filling pressures, so she recommends that we decrease her spironolactone to 50 mg po daily, start her on lasix 40 mg po daily, and have her come back in, in a week to recheck a BMET.  Confirmed the pharmacy of choice with the pt.  Scheduled the pt for lab appt, to recheck a BMET for next Wednesday 09/25/18.  Pt verbalized understanding and agrees with this plan.  Pt requested that I send a copy of her echo to her PCP Dr. Joylene Draft, for she will have her Physical with him tomorrow.  Pt gracious for all the assistance provided.

## 2018-09-19 DIAGNOSIS — E038 Other specified hypothyroidism: Secondary | ICD-10-CM | POA: Diagnosis not present

## 2018-09-19 DIAGNOSIS — R279 Unspecified lack of coordination: Secondary | ICD-10-CM | POA: Diagnosis not present

## 2018-09-19 DIAGNOSIS — I10 Essential (primary) hypertension: Secondary | ICD-10-CM | POA: Diagnosis not present

## 2018-09-19 DIAGNOSIS — E7849 Other hyperlipidemia: Secondary | ICD-10-CM | POA: Diagnosis not present

## 2018-09-19 DIAGNOSIS — N183 Chronic kidney disease, stage 3 (moderate): Secondary | ICD-10-CM | POA: Diagnosis not present

## 2018-09-19 DIAGNOSIS — Z1331 Encounter for screening for depression: Secondary | ICD-10-CM | POA: Diagnosis not present

## 2018-09-19 DIAGNOSIS — I2510 Atherosclerotic heart disease of native coronary artery without angina pectoris: Secondary | ICD-10-CM | POA: Diagnosis not present

## 2018-09-19 DIAGNOSIS — G43C1 Periodic headache syndromes in child or adult, intractable: Secondary | ICD-10-CM | POA: Diagnosis not present

## 2018-09-19 DIAGNOSIS — Z1339 Encounter for screening examination for other mental health and behavioral disorders: Secondary | ICD-10-CM | POA: Diagnosis not present

## 2018-09-19 DIAGNOSIS — Z0000 Encounter for general adult medical examination without abnormal findings: Secondary | ICD-10-CM | POA: Diagnosis not present

## 2018-09-19 DIAGNOSIS — J449 Chronic obstructive pulmonary disease, unspecified: Secondary | ICD-10-CM | POA: Diagnosis not present

## 2018-09-19 DIAGNOSIS — Z6829 Body mass index (BMI) 29.0-29.9, adult: Secondary | ICD-10-CM | POA: Diagnosis not present

## 2018-09-25 ENCOUNTER — Other Ambulatory Visit: Payer: Medicare Other | Admitting: *Deleted

## 2018-09-25 DIAGNOSIS — R0602 Shortness of breath: Secondary | ICD-10-CM | POA: Diagnosis not present

## 2018-09-25 DIAGNOSIS — R7989 Other specified abnormal findings of blood chemistry: Secondary | ICD-10-CM

## 2018-09-25 DIAGNOSIS — E875 Hyperkalemia: Secondary | ICD-10-CM | POA: Diagnosis not present

## 2018-09-25 DIAGNOSIS — R0609 Other forms of dyspnea: Secondary | ICD-10-CM | POA: Diagnosis not present

## 2018-09-26 LAB — BASIC METABOLIC PANEL
BUN/Creatinine Ratio: 25 (ref 12–28)
BUN: 30 mg/dL — ABNORMAL HIGH (ref 8–27)
CO2: 22 mmol/L (ref 20–29)
Calcium: 9.1 mg/dL (ref 8.7–10.3)
Chloride: 100 mmol/L (ref 96–106)
Creatinine, Ser: 1.22 mg/dL — ABNORMAL HIGH (ref 0.57–1.00)
GFR calc Af Amer: 52 mL/min/{1.73_m2} — ABNORMAL LOW (ref >59)
GFR calc non Af Amer: 45 mL/min/{1.73_m2} — ABNORMAL LOW (ref >59)
Glucose: 127 mg/dL — ABNORMAL HIGH (ref 65–99)
Potassium: 4.9 mmol/L (ref 3.5–5.2)
Sodium: 139 mmol/L (ref 134–144)

## 2018-09-27 ENCOUNTER — Telehealth (HOSPITAL_COMMUNITY): Payer: Self-pay | Admitting: Emergency Medicine

## 2018-09-27 NOTE — Telephone Encounter (Signed)
Reaching out to patient to offer assistance regarding upcoming cardiac imaging study; pt verbalizes understanding of appt date/time, parking situation and where to check in, pre-test NPO status and medications ordered, and verified current allergies; name and call back number provided for further questions should they arise Lynnsie Linders RN Navigator Cardiac Imaging [facility] [phone] office [phone] cell 

## 2018-10-01 ENCOUNTER — Ambulatory Visit (HOSPITAL_COMMUNITY): Admission: RE | Admit: 2018-10-01 | Payer: Medicare Other | Source: Ambulatory Visit

## 2018-10-01 ENCOUNTER — Ambulatory Visit (HOSPITAL_COMMUNITY)
Admission: RE | Admit: 2018-10-01 | Discharge: 2018-10-01 | Disposition: A | Discharge status: Home / Self Care | Payer: Medicare Other | Source: Ambulatory Visit | Attending: Cardiology | Admitting: Cardiology

## 2018-10-01 DIAGNOSIS — R0609 Other forms of dyspnea: Secondary | ICD-10-CM | POA: Diagnosis not present

## 2018-10-01 DIAGNOSIS — R072 Precordial pain: Secondary | ICD-10-CM | POA: Diagnosis not present

## 2018-10-01 DIAGNOSIS — I10 Essential (primary) hypertension: Secondary | ICD-10-CM

## 2018-10-01 DIAGNOSIS — Z006 Encounter for examination for normal comparison and control in clinical research program: Secondary | ICD-10-CM

## 2018-10-01 DIAGNOSIS — E785 Hyperlipidemia, unspecified: Secondary | ICD-10-CM | POA: Diagnosis not present

## 2018-10-01 MED ORDER — IOPAMIDOL (ISOVUE-370) INJECTION 76%
80.0000 mL | Freq: Once | INTRAVENOUS | Status: AC
  Administered 2018-10-01: 80 mL via INTRAVENOUS

## 2018-10-01 MED ORDER — NITROGLYCERIN 0.4 MG SL SUBL
SUBLINGUAL_TABLET | SUBLINGUAL | Status: AC
  Filled 2018-10-01: qty 2

## 2018-10-01 MED ORDER — NITROGLYCERIN 0.4 MG SL SUBL
0.8000 mg | SUBLINGUAL_TABLET | Freq: Once | SUBLINGUAL | Status: AC
  Administered 2018-10-01: 0.8 mg via SUBLINGUAL
  Filled 2018-10-01: qty 25

## 2018-10-01 NOTE — Progress Notes (Signed)
CT scan completed. Tolerated well. D/C home walking, awake and alert. In no distress. 

## 2018-10-01 NOTE — Research (Signed)
Katherine Greer met inclusion and exclusion criteria.  The informed consent form, study requirements and expectations were reviewed with the subject and questions and concerns were addressed prior to the signing of the consent form.  The subject verbalized understanding of the trial requirements.  The subject agreed to participate in the CADFEM trial and signed the informed consent.  The informed consent was obtained prior to performance of any protocol-specific procedures for the subject.  A copy of the signed informed consent was given to the subject and a copy was placed in the subject's medical record.   Dionne Bucy. [employer]

## 2018-10-03 ENCOUNTER — Other Ambulatory Visit: Payer: Self-pay | Admitting: Internal Medicine

## 2018-10-03 DIAGNOSIS — N183 Chronic kidney disease, stage 3 unspecified: Secondary | ICD-10-CM

## 2018-10-03 DIAGNOSIS — R7989 Other specified abnormal findings of blood chemistry: Secondary | ICD-10-CM

## 2018-10-03 DIAGNOSIS — R945 Abnormal results of liver function studies: Secondary | ICD-10-CM

## 2018-10-07 ENCOUNTER — Other Ambulatory Visit: Payer: Self-pay | Admitting: Neurology

## 2018-10-10 ENCOUNTER — Ambulatory Visit (INDEPENDENT_AMBULATORY_CARE_PROVIDER_SITE_OTHER): Payer: Medicare Other | Admitting: Neurology

## 2018-10-10 ENCOUNTER — Encounter: Payer: Self-pay | Admitting: Neurology

## 2018-10-10 VITALS — BP 113/58 | HR 65 | Ht 66.0 in | Wt 189.3 lb

## 2018-10-10 DIAGNOSIS — G4489 Other headache syndrome: Secondary | ICD-10-CM

## 2018-10-10 NOTE — Progress Notes (Signed)
Reason for visit: Headache  Katherine Greer is an 68 y.o. female  History of present illness:  Katherine Greer is a 68 year old right-handed white female with a history of migraine headaches.  The patient has had problems with episodic vertigo off and on over the last 2 years.  The patient has also had some chronic neck pain.  She began having fairly severe headaches in October 2019.  The patient was given a trial on several medications including verapamil, Topamax, Depakote, and gabapentin.  The patient is on Coreg.  The patient was placed on low-dose gabapentin around 12 August 2018.  Within 3 days, her headaches resolved.  She has noted some improvement in her neck pain as well.  The patient had an explosion sensation in the head and within 2 days her headaches were gone.  The patient has done quite well since that time.  She returns for an evaluation.  Past Medical History:  Diagnosis Date  . Abdominal bloating 05/03/2018  . Allergy   . Arthritis   . Asthma   . Cataract   . Colon polyp   . GERD (gastroesophageal reflux disease)   . Heart murmur   . Osteopenia   . Pill dysphagia 05/03/2018  . SOB (shortness of breath) 05/03/2018  . Thyroid disease   . UTI (urinary tract infection)     Past Surgical History:  Procedure Laterality Date  . FOOT SURGERY Bilateral     Family History  Problem Relation Age of Onset  . Cancer Son        Pituitary- Metastatic Brain  . Heart attack Mother   . Diabetes Father   . Chronic Renal Failure Father   . Lung cancer Father   . Breast cancer Sister   . Diabetes Brother   . Colon cancer Neg Hx   . Esophageal cancer Neg Hx   . Inflammatory bowel disease Neg Hx   . Liver disease Neg Hx   . Pancreatic cancer Neg Hx   . Stomach cancer Neg Hx   . Rectal cancer Neg Hx     Social history:  reports that she has quit smoking. She has never used smokeless tobacco. She reports current alcohol use. She reports that she does not use drugs.    Allergies    Allergen Reactions  . Augmentin [Amoxicillin-Pot Clavulanate] Anaphylaxis    Rash Has patient had a PCN reaction causing immediate rash, facial/tongue/throat swelling, SOB or lightheadedness with hypotension: No Has patient had a PCN reaction causing severe rash involving mucus membranes or skin necrosis: Yes Has patient had a PCN reaction that required hospitalization: No Has patient had a PCN reaction occurring within the last 10 years: No If all of the above answers are "NO", then may proceed with Cephalosporin use.   Marland Kitchen Keflex [Cephalexin] Anaphylaxis  . Levaquin [Levofloxacin In D5w] Anaphylaxis  . Sulfa Antibiotics Anaphylaxis  . Depakote Er [Divalproex Sodium Er]     Nausea  . Latex Itching  . Topamax [Topiramate]     Nausea    Medications:  Prior to Admission medications   Medication Sig Start Date End Date Taking? Authorizing Provider  acetaminophen (TYLENOL) 500 MG tablet Take 1,000 mg by mouth every 6 (six) hours as needed for mild pain or moderate pain.    Yes [provider]  atorvastatin (LIPITOR) 40 MG tablet Take 40 mg by mouth daily. 02/03/18  Yes [provider]  carvedilol (COREG) 6.25 MG tablet Take 1 tablet (6.25 mg total)  by mouth 2 (two) times daily. 09/09/18  Yes Dorothy Spark, MD  Cholecalciferol 2000 units CAPS Take 2,000 Units by mouth daily. 02/08/15  Yes [provider]  dextromethorphan-guaiFENesin (MUCINEX DM) 30-600 MG 12hr tablet Take 1 tablet by mouth as needed.    Yes [provider]  furosemide (LASIX) 40 MG tablet Take 1 tablet (40 mg total) by mouth daily. 09/18/18  Yes Dorothy Spark, MD  gabapentin (NEURONTIN) 100 MG capsule 1 capsule in the morning, 2 in the evening 08/12/18  Yes Kathrynn Ducking, MD  levothyroxine (SYNTHROID, LEVOTHROID) 112 MCG tablet Take 112 mcg by mouth daily before breakfast.   Yes [provider]  nicotine polacrilex (COMMIT) 2 MG lozenge Take 2 mg by mouth as needed for  smoking cessation.   Yes [provider]  omeprazole (PRILOSEC) 20 MG capsule Take 20 mg by mouth daily. 06/04/17  Yes [provider]  OVER THE COUNTER MEDICATION Volteran Gel, As needed.   Yes [provider]  spironolactone (ALDACTONE) 50 MG tablet Take 1 tablet (50 mg total) by mouth daily. 09/18/18  Yes Dorothy Spark, MD  tolterodine (DETROL LA) 4 MG 24 hr capsule Take 4 mg by mouth daily. 02/15/15  Yes [provider]  verapamil (CALAN-SR) 120 MG CR tablet TAKE 1 TABLET (120 MG TOTAL) BY MOUTH AT BEDTIME. 10/07/18  Yes Kathrynn Ducking, MD    ROS:  Out of a complete 14 system review of symptoms, the patient complains only of the following symptoms, and all other reviewed systems are negative.  Runny nose Wheezing Heart murmur Swollen abdomen Snoring Itching  Blood pressure (!) 113/58, pulse 65, height 5\' 6"  (1.676 m), weight 189 lb 5 oz (85.9 kg).  Physical Exam  General: The patient is alert and cooperative at the time of the examination.  Skin: No significant peripheral edema is noted.   Neurologic Exam  Mental status: The patient is alert and oriented x 3 at the time of the examination. The patient has apparent normal recent and remote memory, with an apparently normal attention span and concentration ability.   Cranial nerves: Facial symmetry is present. Speech is normal, no aphasia or dysarthria is noted. Extraocular movements are full. Visual fields are full.  Motor: The patient has good strength in all 4 extremities.  Sensory examination: Soft touch sensation is symmetric on the face, arms, and legs.  Coordination: The patient has good finger-nose-finger and heel-to-shin bilaterally.  Gait and station: The patient has a normal gait. Tandem gait is normal. Romberg is negative. No drift is seen.  Reflexes: Deep tendon reflexes are symmetric.   Assessment/Plan:  1.  History of headache, resolved  2.  Cervical  spondylosis, chronic neck pain  3.  Episodic vertigo  The patient is doing better on the gabapentin, we will continue the medication for now, the patient will stop the verapamil.  She will follow-up through this office in 6 months.  Jill Alexanders MD 10/10/2018 12:03 PM  [facility] [address] [place],  [address]  Phone [phone] Fax [phone]

## 2018-10-10 NOTE — Patient Instructions (Signed)
Continue the gabapentin for now, stop the verapamil.

## 2018-10-11 ENCOUNTER — Other Ambulatory Visit: Payer: Self-pay | Admitting: Internal Medicine

## 2018-10-11 DIAGNOSIS — N183 Chronic kidney disease, stage 3 unspecified: Secondary | ICD-10-CM

## 2018-10-11 DIAGNOSIS — R945 Abnormal results of liver function studies: Secondary | ICD-10-CM

## 2018-10-17 ENCOUNTER — Ambulatory Visit
Admission: RE | Admit: 2018-10-17 | Discharge: 2018-10-17 | Disposition: A | Discharge status: Home / Self Care | Payer: Medicare Other | Source: Ambulatory Visit | Attending: Internal Medicine | Admitting: Internal Medicine

## 2018-10-17 DIAGNOSIS — N183 Chronic kidney disease, stage 3 unspecified: Secondary | ICD-10-CM

## 2018-10-17 DIAGNOSIS — R7989 Other specified abnormal findings of blood chemistry: Secondary | ICD-10-CM

## 2018-10-17 DIAGNOSIS — R945 Abnormal results of liver function studies: Secondary | ICD-10-CM

## 2018-11-21 DIAGNOSIS — R945 Abnormal results of liver function studies: Secondary | ICD-10-CM | POA: Diagnosis not present

## 2018-12-03 ENCOUNTER — Other Ambulatory Visit: Payer: Self-pay | Admitting: Neurology

## 2018-12-08 ENCOUNTER — Other Ambulatory Visit: Payer: Self-pay | Admitting: Cardiology

## 2018-12-08 DIAGNOSIS — R0609 Other forms of dyspnea: Principal | ICD-10-CM

## 2018-12-08 DIAGNOSIS — R0602 Shortness of breath: Secondary | ICD-10-CM

## 2018-12-08 DIAGNOSIS — R7989 Other specified abnormal findings of blood chemistry: Secondary | ICD-10-CM

## 2018-12-08 DIAGNOSIS — E875 Hyperkalemia: Secondary | ICD-10-CM

## 2018-12-10 ENCOUNTER — Telehealth: Payer: Self-pay | Admitting: *Deleted

## 2018-12-10 DIAGNOSIS — R072 Precordial pain: Secondary | ICD-10-CM

## 2018-12-10 DIAGNOSIS — I10 Essential (primary) hypertension: Secondary | ICD-10-CM

## 2018-12-10 DIAGNOSIS — R0609 Other forms of dyspnea: Principal | ICD-10-CM

## 2018-12-10 DIAGNOSIS — E785 Hyperlipidemia, unspecified: Secondary | ICD-10-CM

## 2018-12-10 MED ORDER — CARVEDILOL 6.25 MG PO TABS: 6.2500 mg | ORAL_TABLET | Freq: Two times a day (BID) | ORAL | 2 refills | Status: DC

## 2018-12-10 NOTE — Telephone Encounter (Signed)
Virtual Visit Pre-Appointment Phone Call  Steps For Call:  1. Confirm consent - "In the setting of the current Covid19 crisis, you are scheduled for a (video) visit with Dr. Meda Coffee on 12/26/18 at [place]. Just as we do with mDany in-office visits, in order for you to participate in this visit, we must obtain consent.  If you'd like, I can send this to your mychart (if signed up) or email for you to review.  Otherwise, I can obtain your verbal consent now.  All virtual visits are billed to your insurance company just like a normal visit would be.  By agreeing to a virtual visit, we'd like you to understand that the technology does not allow for your provider to perform an examination, and thus may limit your provider's ability to fully assess your condition.  Finally, though the technology is pretty good, we cannot assure that it will always work on either your or our end, and in the setting of a video visit, we may have to convert it to a phone-only visit.  In either situation, we cannot ensure that we have a secure connection.  Are you willing to proceed?" STAFF: Did the patient verbally acknowledge consent to telehealth visit? YES PATIENT GAVE VERBAL CONSENT OVER THE PHONE TODAY FOR DR Meda Coffee TO TREAT HER VIA VIDEO VISIT THROUGH [place] ON 4/30 AT [place].  2. Confirm the BEST phone number to call the day of the visit: YES AT SMART[employer] AT [phone]  3. Give patient instructions for DOXIMITY USE FOR HER VIDEO VISIT WITH DR Meda Coffee ON 4/30.   4. Advise patient to be prepared with any vital sign or heart rhythm information, their current medicines, and a piece of paper and pen handy for any instructions they may receive the day of their visit  5. Inform patient they will receive a phone call 15 minutes prior to their appointment time (may be from unknown caller ID) so they should be prepared to answer  6. Confirm that appointment type is correct in Epic appointment notes (video visit with Dr. Meda Coffee  on 4/30 via doximity use )     TELEPHONE CALL NOTE  Katherine Greer has been deemed a candidate for a follow-up tele-health visit to limit community exposure during the Covid-19 pandemic. I spoke with the patient via phone to ensure availability of phone/video source, confirm preferred email & phone number, and discuss instructions and expectations.  I reminded Katherine Greer to be prepared with any vital sign and/or heart rhythm information that could potentially be obtained via home monitoring, at the time of her visit. I reminded Katherine Greer to expect a phone call at the time of her visit if her visit.  Katherine Alpha, LPN 7/51/7001 7:49 PM      FULL LENGTH CONSENT FOR TELE-HEALTH VISIT   I hereby voluntarily request, consent and authorize [facility] and its employed or contracted physicians, physician assistants, nurse practitioners or other licensed health care professionals (the Practitioner), to provide me with telemedicine health care services (the "Services") as deemed necessary by the treating Practitioner. I acknowledge and consent to receive the Services by the Practitioner via telemedicine. I understand that the telemedicine visit will involve communicating with the Practitioner through live audiovisual communication technology and the disclosure of certain medical information by electronic transmission. I acknowledge that I have been given the opportunity to request an in-person assessment or other available alternative prior to the telemedicine visit and am voluntarily participating in the telemedicine visit.  I  understand that I have the right to withhold or withdraw my consent to the use of telemedicine in the course of my care at any time, without affecting my right to future care or treatment, and that the Practitioner or I may terminate the telemedicine visit at any time. I understand that I have the right to inspect all information obtained and/or recorded in the course of the  telemedicine visit and may receive copies of available information for a reasonable fee.  I understand that some of the potential risks of receiving the Services via telemedicine include:  Marland Kitchen Delay or interruption in medical evaluation due to technological equipment failure or disruption; . Information transmitted may not be sufficient (e.g. poor resolution of images) to allow for appropriate medical decision making by the Practitioner; and/or  . In rare instances, security protocols could fail, causing a breach of personal health information.  Furthermore, I acknowledge that it is my responsibility to provide information about my medical history, conditions and care that is complete and accurate to the best of my ability. I acknowledge that Practitioner's advice, recommendations, and/or decision may be based on factors not within their control, such as incomplete or inaccurate data provided by me or distortions of diagnostic images or specimens that may result from electronic transmissions. I understand that the practice of medicine is not an exact science and that Practitioner makes no warranties or guarantees regarding treatment outcomes. I acknowledge that I will receive a copy of this consent concurrently upon execution via email to the email address I last provided but may also request a printed copy by calling the office of [place].    I understand that my insurance will be billed for this visit.   I have read or had this consent read to me. . I understand the contents of this consent, which adequately explains the benefits and risks of the Services being provided via telemedicine.  . I have been provided ample opportunity to ask questions regarding this consent and the Services and have had my questions answered to my satisfaction. . I give my informed consent for the services to be provided through the use of telemedicine in my medical care  By participating in this telemedicine visit I  agree to the above.  Pt gave verbal consent on the phone with me today, for Dr. Meda Coffee to treat her via video visit through doximity use on 12/26/18.

## 2018-12-26 ENCOUNTER — Encounter: Payer: Self-pay | Admitting: Cardiology

## 2018-12-26 ENCOUNTER — Other Ambulatory Visit: Payer: Self-pay

## 2018-12-26 ENCOUNTER — Telehealth (INDEPENDENT_AMBULATORY_CARE_PROVIDER_SITE_OTHER): Payer: Medicare Other | Admitting: Cardiology

## 2018-12-26 VITALS — Ht 67.0 in | Wt 189.0 lb

## 2018-12-26 DIAGNOSIS — R0609 Other forms of dyspnea: Secondary | ICD-10-CM

## 2018-12-26 DIAGNOSIS — I2510 Atherosclerotic heart disease of native coronary artery without angina pectoris: Secondary | ICD-10-CM

## 2018-12-26 DIAGNOSIS — I5030 Unspecified diastolic (congestive) heart failure: Secondary | ICD-10-CM | POA: Diagnosis not present

## 2018-12-26 DIAGNOSIS — E785 Hyperlipidemia, unspecified: Secondary | ICD-10-CM

## 2018-12-26 DIAGNOSIS — I10 Essential (primary) hypertension: Secondary | ICD-10-CM

## 2018-12-26 NOTE — Progress Notes (Signed)
Virtual Visit via Video Note   This visit type was conducted due to national recommendations for restrictions regarding the COVID-19 Pandemic (e.g. social distancing) in an effort to limit this patient's exposure and mitigate transmission in our community.  Due to her co-morbid illnesses, this patient is at least at moderate risk for complications without adequate follow up.  This format is felt to be most appropriate for this patient at this time.  All issues noted in this document were discussed and addressed.  A limited physical exam was performed with this format.  Please refer to the patient's chart for her consent to telehealth for [facility].   Evaluation Performed:  Follow-up visit  Date:  12/26/2018   ID:  Katherine Greer, DOB [birth year], MRN [id number]  Patient Location: Home Provider Location: Office  PCP:  Crist Infante, MD  Cardiologist:  Ena Dawley, MD  Chief Complaint:  2 months follow up  History of Present Illness:    Katherine Greer is a 68 y.o. female with history of intractable headaches with extensive work-up by Dr. Jannifer Franklin, including head CT and MRI and lumbar puncture at [facility].  She was tried on multiple medications and she developed significant intolerances to multiple of them she developed intolerances including propranolol, Topamax and Depacon. Her headaches eventually went away but the etiology was never identified.  She is currently on Neurontin and verapamil.  She has also noticed worsening blood pressure, she states she always had low blood pressure so this is new for her.  She has also noticed worsening dyspnea on exertion.  She got married in 2018 and change her active lifestyle to a very sedentary lifestyle, she cooks 3 times a day does not have time to go to gym.  She has gained 20 pounds.  Her mother had myocardial infarction in her 24s.  12/26/2018 --the patient underwent echocardiogram that showed LVEF 60 to 40%, grade 1 diastolic dysfunction with  increased filling pressures, her coronary CTA showed minimal nonobstructive CAD.  Her carvedilol was increased to 6.25 mg p.o. twice daily, she started regular exercise, she is limited by 5 injuries in her feet but otherwise feels better.  The patient does not have symptoms concerning for COVID-19 infection (fever, chills, cough, or new shortness of breath).   Past Medical History:  Diagnosis Date  . Abdominal bloating 05/03/2018  . Allergy   . Arthritis   . Asthma   . Cataract   . Colon polyp   . GERD (gastroesophageal reflux disease)   . Heart murmur   . Osteopenia   . Pill dysphagia 05/03/2018  . SOB (shortness of breath) 05/03/2018  . Thyroid disease   . UTI (urinary tract infection)    Past Surgical History:  Procedure Laterality Date  . FOOT SURGERY Bilateral      Current Meds  Medication Sig  . acetaminophen (TYLENOL) 500 MG tablet Take 1,000 mg by mouth every 6 (six) hours as needed for mild pain or moderate pain.   Marland Kitchen atorvastatin (LIPITOR) 40 MG tablet Take 40 mg by mouth daily.  . carvedilol (COREG) 6.25 MG tablet Take 1 tablet (6.25 mg total) by mouth 2 (two) times daily.  . Cholecalciferol 2000 units CAPS Take 2,000 Units by mouth daily.  Marland Kitchen dextromethorphan-guaiFENesin (MUCINEX DM) 30-600 MG 12hr tablet Take 1 tablet by mouth as needed.   . famotidine-calcium carbonate-magnesium hydroxide (PEPCID COMPLETE) 10-800-165 MG chewable tablet Chew 1 tablet by mouth daily as needed.  . furosemide (LASIX) 40 MG tablet  TAKE 1 TABLET BY MOUTH EVERY DAY  . gabapentin (NEURONTIN) 100 MG capsule TAKE 1 CAPSULE EVERY MORNING AND 2 CAPSULES IN THE EVENING  . halobetasol (ULTRAVATE) 0.05 % cream APPLY TO ITCHY AREAS DAILY AVOIDING NORMAL SKIN  . levothyroxine (SYNTHROID, LEVOTHROID) 112 MCG tablet Take 112 mcg by mouth daily before breakfast.  . nicotine polacrilex (COMMIT) 2 MG lozenge Take 2 mg by mouth as needed for smoking cessation.  Marland Kitchen spironolactone (ALDACTONE) 50 MG tablet Take 1  tablet (50 mg total) by mouth daily.  Marland Kitchen tolterodine (DETROL LA) 4 MG 24 hr capsule Take 4 mg by mouth daily.     Allergies:   Augmentin [amoxicillin-pot clavulanate]; Keflex [cephalexin]; Levaquin [levofloxacin in d5w]; Sulfa antibiotics; Depakote er [divalproex sodium er]; Latex; and Topamax [topiramate]   Social History   Tobacco Use  . Smoking status: Former Research scientist (life sciences)  . Smokeless tobacco: Never Used  . Tobacco comment: Quit 2015  Substance Use Topics  . Alcohol use: Yes    Frequency: Never    Comment: wine 4 times a week  . Drug use: Never     Family Hx: The patient's family history includes Breast cancer in her sister; Cancer in her son; Chronic Renal Failure in her father; Diabetes in her brother and father; Heart attack in her mother; Lung cancer in her father. There is no history of Colon cancer, Esophageal cancer, Inflammatory bowel disease, Liver disease, Pancreatic cancer, Stomach cancer, or Rectal cancer.  ROS:   Please see the history of present illness.    All other systems reviewed and are negative.  Prior CV studies:   The following studies were reviewed today:  TTE: 08/2018  Left ventricle: The cavity size was normal. There was mild   concentric hypertrophy. Systolic function was normal. The   estimated ejection fraction was in the range of 60% to 65%. Wall   motion was normal; there were no regional wall motion   abnormalities. Doppler parameters are consistent with abnormal   left ventricular relaxation (grade 1 diastolic dysfunction).   Doppler parameters are consistent with elevated ventricular   end-diastolic filling pressure. - Aortic valve: There was mild regurgitation. - Right ventricle: The cavity size was normal. Wall thickness was   normal. Systolic function was normal. - Right atrium: The atrium was normal in size. - Tricuspid valve: There was trivial regurgitation. - Pulmonary arteries: Systolic pressure was within the normal   range. - Inferior  vena cava: The vessel was normal in size. - Pericardium, extracardiac: There was no pericardial effusion.  Coronary CTA:  1. Coronary calcium score of 46. This was 69 percentile for age and sex matched control.  2. Normal coronary origin with right dominance.  3. Minimal diffuse CAD.  Risk factor modification is recommended.  Labs/Other Tests and Data Reviewed:    EKG:  No ECG reviewed.  Recent Labs: 07/15/2018: ALT 19; Hemoglobin 13.8; Platelets 363 09/25/2018: BUN 30; Creatinine, Ser 1.22; Potassium 4.9; Sodium 139   Recent Lipid Panel No results found for: CHOL, TRIG, HDL, CHOLHDL, LDLCALC, LDLDIRECT  Wt Readings from Last 3 Encounters:  12/26/18 189 lb (85.7 kg)  10/10/18 189 lb 5 oz (85.9 kg)  09/09/18 191 lb 9.6 oz (86.9 kg)     Objective:    Vital Signs:  Ht 5\' 7"  (1.702 m)   Wt 189 lb (85.7 kg)   BMI 29.60 kg/m    VITAL SIGNS:  reviewed  ASSESSMENT & PLAN:    1. Chronic diastolic CHF -echocardiogram  shows 665% LVEF with grade 1 diastolic dysfunction with elevated filling pressures, will continue Lasix 40 and spironolactone 50, she would like to increase it to 100 given her hair on her facial hair, this was originally prescribed by dermatologist however she is CKD stage III, she is supposed to visit with nephrologist.   2. Chest pain  -resolved, coronary CTA shows only minimal nonobstructive CAD we will continue medical management for hypertension and hyperlipidemia.  She is encouraged to continue taking carvedilol, atorvastatin and continue regular exercise. 3. Hypertension -controlled 4. Hyperlipidemia -continue atorvastatin. 5. CKD stage III -he is advised not to use NSAIDs, I would continue 50 mg of spironolactone only, close follow-up repeat labs prior to the next visit, also is advised to arrange visit with nephrologist, she insists of starting taking Prilosec again as ranitidine is on recall and not providing her any relief.  6. COVID-19 Education: The  signs and symptoms of COVID-19 were discussed with the patient and how to seek care for testing (follow up with PCP or arrange E-visit).  The importance of social distancing was discussed today.  Time:   Today, I have spent 25 minutes with the patient with telehealth technology discussing the above problems.     Medication Adjustments/Labs and Tests Ordered: Current medicines are reviewed at length with the patient today.  Concerns regarding medicines are outlined above.   Tests Ordered: No orders of the defined types were placed in this encounter.   Medication Changes: No orders of the defined types were placed in this encounter.   Disposition:  Follow up in 6 month(s)  Signed, Ena Dawley, MD  12/26/2018 9:52 AM    [facility]

## 2018-12-26 NOTE — Patient Instructions (Signed)
Medication Instructions:   Your physician recommends that you continue on your current medications as directed. Please refer to the Current Medication list given to you today.  If you need a refill on your cardiac medications before your next appointment, please call your pharmacy.    Lab work:  A FEW DAYS PRIOR TO YOUR 6 MONTH FOLLOW-UP APPOINTMENT WITH DR NELSON--WE WILL ARRANGE THIS LAB APPOINTMENT CLOSER TO THAT DATE--WE WILL CHECK A CMET, CBC W DIFF, TSH, PRO-BNP, AND LIPIDS--PLEASE COME FASTING TO THIS LAB APPOINTMENT, MEANING NOTHING TO EAT OR DRINK AFTER MIDNIGHT THE NIGHT BEFORE, EXCEPT FOR YOU MAY HAVE WATER WITH YOUR MEDICATIONS THAT MORNING.  If you have labs (blood work) drawn today and your tests are completely normal, you will receive your results only by: Marland Kitchen MyChart Message (if you have MyChart) OR . A paper copy in the mail If you have any lab test that is abnormal or we need to change your treatment, we will call you to review the results.    Follow-Up: At [facility], you and your health needs are our priority.  As part of our continuing mission to provide you with exceptional heart care, we have created designated Provider Care Teams.  These Care Teams include your primary Cardiologist (physician) and Advanced Practice Providers (APPs -  Physician Assistants and Nurse Practitioners) who all work together to provide you with the care you need, when you need it. You will need a follow up appointment in 6 months with Dr. Meda Coffee.  Please call our office 2 months in advance to schedule this appointment.  You may see Dr. Meda Coffee or one of the following Advanced Practice Providers on your designated Care Team:   [employer], PA-C Melina Copa, PA-C . Ermalinda Barrios, PA-C   *please have your labs arranged a few days prior to this appointment

## 2019-01-15 DIAGNOSIS — I129 Hypertensive chronic kidney disease with stage 1 through stage 4 chronic kidney disease, or unspecified chronic kidney disease: Secondary | ICD-10-CM | POA: Diagnosis not present

## 2019-01-15 DIAGNOSIS — I5032 Chronic diastolic (congestive) heart failure: Secondary | ICD-10-CM | POA: Diagnosis not present

## 2019-01-15 DIAGNOSIS — N183 Chronic kidney disease, stage 3 (moderate): Secondary | ICD-10-CM | POA: Diagnosis not present

## 2019-01-15 DIAGNOSIS — D649 Anemia, unspecified: Secondary | ICD-10-CM | POA: Diagnosis not present

## 2019-01-15 DIAGNOSIS — N2581 Secondary hyperparathyroidism of renal origin: Secondary | ICD-10-CM | POA: Diagnosis not present

## 2019-01-23 DIAGNOSIS — L682 Localized hypertrichosis: Secondary | ICD-10-CM | POA: Diagnosis not present

## 2019-01-23 DIAGNOSIS — L570 Actinic keratosis: Secondary | ICD-10-CM | POA: Diagnosis not present

## 2019-01-23 DIAGNOSIS — L821 Other seborrheic keratosis: Secondary | ICD-10-CM | POA: Diagnosis not present

## 2019-01-23 DIAGNOSIS — L301 Dyshidrosis [pompholyx]: Secondary | ICD-10-CM | POA: Diagnosis not present

## 2019-01-31 ENCOUNTER — Other Ambulatory Visit: Payer: Self-pay | Admitting: Nephrology

## 2019-01-31 DIAGNOSIS — N183 Chronic kidney disease, stage 3 unspecified: Secondary | ICD-10-CM

## 2019-01-31 DIAGNOSIS — I10 Essential (primary) hypertension: Secondary | ICD-10-CM

## 2019-02-13 ENCOUNTER — Ambulatory Visit
Admission: RE | Admit: 2019-02-13 | Discharge: 2019-02-13 | Disposition: A | Discharge status: Home / Self Care | Payer: Medicare Other | Source: Ambulatory Visit | Attending: Nephrology | Admitting: Nephrology

## 2019-02-13 DIAGNOSIS — I701 Atherosclerosis of renal artery: Secondary | ICD-10-CM | POA: Diagnosis not present

## 2019-02-13 DIAGNOSIS — N183 Chronic kidney disease, stage 3 unspecified: Secondary | ICD-10-CM

## 2019-02-13 DIAGNOSIS — I10 Essential (primary) hypertension: Secondary | ICD-10-CM

## 2019-03-06 ENCOUNTER — Other Ambulatory Visit: Payer: Self-pay | Admitting: Nephrology

## 2019-03-07 ENCOUNTER — Other Ambulatory Visit: Payer: Self-pay | Admitting: Nephrology

## 2019-03-07 DIAGNOSIS — I701 Atherosclerosis of renal artery: Secondary | ICD-10-CM

## 2019-03-13 DIAGNOSIS — H10023 Other mucopurulent conjunctivitis, bilateral: Secondary | ICD-10-CM | POA: Diagnosis not present

## 2019-03-24 DIAGNOSIS — G43C1 Periodic headache syndromes in child or adult, intractable: Secondary | ICD-10-CM | POA: Diagnosis not present

## 2019-03-24 DIAGNOSIS — N183 Chronic kidney disease, stage 3 (moderate): Secondary | ICD-10-CM | POA: Diagnosis not present

## 2019-03-24 DIAGNOSIS — I519 Heart disease, unspecified: Secondary | ICD-10-CM | POA: Diagnosis not present

## 2019-03-24 DIAGNOSIS — R945 Abnormal results of liver function studies: Secondary | ICD-10-CM | POA: Diagnosis not present

## 2019-03-24 DIAGNOSIS — I2510 Atherosclerotic heart disease of native coronary artery without angina pectoris: Secondary | ICD-10-CM | POA: Diagnosis not present

## 2019-03-24 DIAGNOSIS — E039 Hypothyroidism, unspecified: Secondary | ICD-10-CM | POA: Diagnosis not present

## 2019-03-24 DIAGNOSIS — F17209 Nicotine dependence, unspecified, with unspecified nicotine-induced disorders: Secondary | ICD-10-CM | POA: Diagnosis not present

## 2019-03-24 DIAGNOSIS — J449 Chronic obstructive pulmonary disease, unspecified: Secondary | ICD-10-CM | POA: Diagnosis not present

## 2019-03-24 DIAGNOSIS — R7301 Impaired fasting glucose: Secondary | ICD-10-CM | POA: Diagnosis not present

## 2019-03-24 DIAGNOSIS — I129 Hypertensive chronic kidney disease with stage 1 through stage 4 chronic kidney disease, or unspecified chronic kidney disease: Secondary | ICD-10-CM | POA: Diagnosis not present

## 2019-03-24 DIAGNOSIS — K219 Gastro-esophageal reflux disease without esophagitis: Secondary | ICD-10-CM | POA: Diagnosis not present

## 2019-03-27 DIAGNOSIS — Z79899 Other long term (current) drug therapy: Secondary | ICD-10-CM | POA: Diagnosis not present

## 2019-03-27 DIAGNOSIS — E038 Other specified hypothyroidism: Secondary | ICD-10-CM | POA: Diagnosis not present

## 2019-03-31 ENCOUNTER — Other Ambulatory Visit: Payer: Self-pay | Admitting: Nephrology

## 2019-03-31 ENCOUNTER — Other Ambulatory Visit (HOSPITAL_COMMUNITY): Payer: Self-pay | Admitting: Nephrology

## 2019-03-31 DIAGNOSIS — I701 Atherosclerosis of renal artery: Secondary | ICD-10-CM

## 2019-04-01 ENCOUNTER — Inpatient Hospital Stay: Admission: RE | Admit: 2019-04-01 | Payer: Medicare Other | Source: Ambulatory Visit

## 2019-04-04 ENCOUNTER — Other Ambulatory Visit: Payer: Self-pay | Admitting: Neurology

## 2019-04-04 DIAGNOSIS — H1013 Acute atopic conjunctivitis, bilateral: Secondary | ICD-10-CM | POA: Diagnosis not present

## 2019-04-04 DIAGNOSIS — Z961 Presence of intraocular lens: Secondary | ICD-10-CM | POA: Diagnosis not present

## 2019-04-05 ENCOUNTER — Ambulatory Visit (HOSPITAL_COMMUNITY)
Admission: RE | Admit: 2019-04-05 | Discharge: 2019-04-05 | Disposition: A | Discharge status: Home / Self Care | Payer: Medicare Other | Source: Ambulatory Visit | Attending: Nephrology | Admitting: Nephrology

## 2019-04-05 DIAGNOSIS — N261 Atrophy of kidney (terminal): Secondary | ICD-10-CM | POA: Diagnosis not present

## 2019-04-05 DIAGNOSIS — I701 Atherosclerosis of renal artery: Secondary | ICD-10-CM | POA: Insufficient documentation

## 2019-04-05 DIAGNOSIS — N189 Chronic kidney disease, unspecified: Secondary | ICD-10-CM | POA: Diagnosis not present

## 2019-04-10 ENCOUNTER — Ambulatory Visit: Payer: Medicare Other | Admitting: Neurology

## 2019-04-14 DIAGNOSIS — H10413 Chronic giant papillary conjunctivitis, bilateral: Secondary | ICD-10-CM | POA: Diagnosis not present

## 2019-04-15 ENCOUNTER — Telehealth: Payer: Self-pay | Admitting: Cardiology

## 2019-04-15 ENCOUNTER — Other Ambulatory Visit: Payer: Self-pay | Admitting: *Deleted

## 2019-04-15 ENCOUNTER — Encounter: Payer: Self-pay | Admitting: Vascular Surgery

## 2019-04-15 ENCOUNTER — Encounter: Payer: Self-pay | Admitting: *Deleted

## 2019-04-15 ENCOUNTER — Other Ambulatory Visit: Payer: Self-pay

## 2019-04-15 ENCOUNTER — Ambulatory Visit (INDEPENDENT_AMBULATORY_CARE_PROVIDER_SITE_OTHER): Payer: Medicare Other | Admitting: Vascular Surgery

## 2019-04-15 VITALS — BP 115/70 | HR 83 | Temp 97.8°F | Resp 12 | Ht 68.0 in | Wt 197.7 lb

## 2019-04-15 DIAGNOSIS — I701 Atherosclerosis of renal artery: Secondary | ICD-10-CM

## 2019-04-15 NOTE — Telephone Encounter (Signed)
New Message:     Pt says she have been tod that she needs  To have Vascular surgery. She wanted to ask Dr Meda Coffee if she had someone in this group that did Vascular surgery. She said she had beeen referred to a surgeon. She says she would like Dr Meda Coffee opinion on who she was referred to for vascular surgery.

## 2019-04-15 NOTE — Telephone Encounter (Signed)
Attempted to call the pt back on both contact numbers provided, and she did not answer.  Did leave the pt a message to call the office back.

## 2019-04-15 NOTE — Progress Notes (Signed)
[facility] [place]  Patient name: Katherine Greer MRN: [id number] DOB: 07/09/1951 Sex: female  REASON FOR CONSULT: Evaluation of right renal artery stenosis  HPI: Katherine Greer is a 68 y.o. female, who is here for evaluation of right renal artery stenosis.  She is a very pleasant healthy female.  She reports that she began having severe headaches in December 2019.  Work-up revealed new onset of extreme hypertension.  She had treatment of the hypertension and then was found to have some mild renal insufficiency.  Further evaluation included renal ultrasound which showed a small right kidney compared to her left and also evidence of renal artery stenosis on the right.  She subsequently underwent MRI showing right renal artery stenosis at its origin.  She has no history of cardiac disease and no history of peripheral vascular occlusive disease.  Past Medical History:  Diagnosis Date  . Abdominal bloating 05/03/2018  . Allergy   . Arthritis   . Asthma   . Cataract   . Colon polyp   . GERD (gastroesophageal reflux disease)   . Heart murmur   . Osteopenia   . Pill dysphagia 05/03/2018  . SOB (shortness of breath) 05/03/2018  . Thyroid disease   . UTI (urinary tract infection)     Family History  Problem Relation Age of Onset  . Cancer Son        Pituitary- Metastatic Brain  . Heart attack Mother   . Diabetes Father   . Chronic Renal Failure Father   . Lung cancer Father   . Breast cancer Sister   . Diabetes Brother   . Colon cancer Neg Hx   . Esophageal cancer Neg Hx   . Inflammatory bowel disease Neg Hx   . Liver disease Neg Hx   . Pancreatic cancer Neg Hx   . Stomach cancer Neg Hx   . Rectal cancer Neg Hx     SOCIAL HISTORY: Social History   Socioeconomic History  . Marital status: Married    Spouse name: Not on file  . Number of children: 2  . Years of education: Not on file  . Highest education level: Not on file   Occupational History  . 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 Financial resource strain: Not on file  . Food insecurity    Worry: Not on file    Inability: Not on file  . Transportation needs    Medical: Not on file    Non-medical: Not on file  Tobacco Use  . Smoking status: Former Research scientist (life sciences)  . Smokeless tobacco: Never Used  . Tobacco comment: Quit 2015  Substance and Sexual Activity  . Alcohol use: Yes    Frequency: Never    Comment: wine 4 times a week  . Drug use: Never  . Sexual activity: Yes  Lifestyle  . Physical activity    Days per week: Not on file    Minutes per session: Not on file  . Stress: Not on file  Relationships  . Social Herbalist on phone: Not on file    Gets together: Not on file    Attends religious service: Not on file    Active member of club or organization: Not on file    Attends meetings of clubs or organizations: Not on file    Relationship status: Not on file  . Intimate partner violence    Fear of current or  ex partner: Not on file    Emotionally abused: Not on file    Physically abused: Not on file    Forced sexual activity: Not on file  Other Topics Concern  . Not on file  Social History Narrative  . Not on file    Allergies  Allergen Reactions  . Augmentin [Amoxicillin-Pot Clavulanate] Anaphylaxis    Rash Has patient had a PCN reaction causing immediate rash, facial/tongue/throat swelling, SOB or lightheadedness with hypotension: No Has patient had a PCN reaction causing severe rash involving mucus membranes or skin necrosis: Yes Has patient had a PCN reaction that required hospitalization: No Has patient had a PCN reaction occurring within the last 10 years: No If all of the above answers are "NO", then may proceed with Cephalosporin use.   Marland Kitchen Keflex [Cephalexin] Anaphylaxis  . Levaquin [Levofloxacin In D5w] Anaphylaxis  . Sulfa Antibiotics Anaphylaxis  . Depakote Er [Divalproex Sodium Er]      Nausea  . Latex Itching  . Topamax [Topiramate]     Nausea    Current Outpatient Medications  Medication Sig Dispense Refill  . acetaminophen (TYLENOL) 500 MG tablet Take 1,000 mg by mouth every 6 (six) hours as needed for mild pain or moderate pain.     Marland Kitchen atorvastatin (LIPITOR) 40 MG tablet Take 40 mg by mouth daily.  3  . carvedilol (COREG) 6.25 MG tablet Take 1 tablet (6.25 mg total) by mouth 2 (two) times daily. 180 tablet 2  . Cholecalciferol 2000 units CAPS Take 2,000 Units by mouth daily.    Marland Kitchen dextromethorphan-guaiFENesin (MUCINEX DM) 30-600 MG 12hr tablet Take 1 tablet by mouth as needed.     . famotidine-calcium carbonate-magnesium hydroxide (PEPCID COMPLETE) 10-800-165 MG chewable tablet Chew 1 tablet by mouth daily as needed.    . furosemide (LASIX) 40 MG tablet TAKE 1 TABLET BY MOUTH EVERY DAY 90 tablet 1  . gabapentin (NEURONTIN) 100 MG capsule TAKE 1 CAPSULE EVERY MORNING AND 2 CAPSULES IN THE EVENING 270 capsule 2  . halobetasol (ULTRAVATE) 0.05 % cream APPLY TO ITCHY AREAS DAILY AVOIDING NORMAL SKIN    . levothyroxine (SYNTHROID, LEVOTHROID) 112 MCG tablet Take 112 mcg by mouth daily before breakfast.    . nicotine polacrilex (COMMIT) 2 MG lozenge Take 2 mg by mouth as needed for smoking cessation.    Marland Kitchen spironolactone (ALDACTONE) 50 MG tablet Take 1 tablet (50 mg total) by mouth daily. 90 tablet 1  . tolterodine (DETROL LA) 4 MG 24 hr capsule Take 4 mg by mouth daily.     No current facility-administered medications for this visit.     REVIEW OF SYSTEMS:  [X]  denotes positive finding, [ ]  denotes negative finding Cardiac  Comments:  Chest pain or chest pressure:    Shortness of breath upon exertion: x   Short of breath when lying flat:    Irregular heart rhythm:        Vascular    Pain in calf, thigh, or hip brought on by ambulation:    Pain in feet at night that wakes you up from your sleep:     Blood clot in your veins:    Leg swelling:         Pulmonary     Oxygen at home:    Productive cough:     Wheezing:  x       Neurologic    Sudden weakness in arms or legs:     Sudden numbness in arms or legs:  Sudden onset of difficulty speaking or slurred speech:    Temporary loss of vision in one eye:     Problems with dizziness:         Gastrointestinal    Blood in stool:     Vomited blood:         Genitourinary    Burning when urinating:     Blood in urine:        Psychiatric    Major depression:         Hematologic    Bleeding problems:    Problems with blood clotting too easily:        Skin    Rashes or ulcers:        Constitutional    Fever or chills:      PHYSICAL EXAM: Vitals:   04/15/19 1326  BP: 115/70  Pulse: 83  Resp: 12  Temp: 97.8 F (36.6 C)  TempSrc: Temporal  SpO2: 98%  Weight: 197 lb 11.2 oz (89.7 kg)  Height: 5\' 8"  (1.727 m)    GENERAL: The patient is a well-nourished female, in no acute distress. The vital signs are documented above. CARDIOVASCULAR: Carotid arteries without bruits bilaterally.  2+ radial 2+ dorsalis pedis pulses bilaterally.  Abdomen soft with no abdominal bruit noted PULMONARY: There is good air exchange  ABDOMEN: Soft and non-tender  MUSCULOSKELETAL: There are no major deformities or cyanosis. NEUROLOGIC: No focal weakness or paresthesias are detected. SKIN: There are no ulcers or rashes noted. PSYCHIATRIC: The patient has a normal affect.  DATA:  I reviewed her renal duplex from 02/13/2019.  Also reviewed her MRI.  Both of these suggest high-grade stenosis at the origin.  Her right kidney is 9 cm in length and left kidney is 12 cm in length.  MEDICAL ISSUES: Had a very long discussion with the patient regarding this.  Explained is impossible to know if her renal artery stenosis is causing the hypertension or renal insufficiency or even the discrepancy in renal size.  She should continue to have a functional kidney at 9 cm.  I have recommended intervention.  Explained that this  would require a very slight amount of iodinated contrast with right femoral artery puncture and a very slight risk of injury to the artery.  She understands and wishes to proceed with surgery.  We have scheduled this with Dr. Carlis Abbott.  I did introduce her to Dr. Carlis Abbott in the office as well today.  We will schedule this at her earliest [place]. Laurel Harnden, MD [facility] Tel [phone] Pager [phone]

## 2019-04-15 NOTE — Telephone Encounter (Signed)
Pt was calling to inform Dr. Meda Coffee that she was referred to Dr. Curt Jews with Vascular  Surgery.   Pt will be seeing him today at 1 pm, for new pt consult for blockage in her renal artery.  Pt wanted to keep Dr. Meda Coffee in-the-loop about this, and make sure she is agreeable with this referral.  Pt would also like for Dr. Meda Coffee to review Dr. Luther Parody progress note with the pt from today.  Informed the pt that Dr. Donnetta Hutching is connected with our group, and we have referred pts to him.  Advised the pt to keep her appt.  Informed the pt that I will still make Dr. Meda Coffee aware of this, and obtain her input.  Pt verbalized understanding and was appreciative for this assistance.

## 2019-04-15 NOTE — Telephone Encounter (Signed)
Yes I completely agree with this referral, please send her my greetings and wish her good luck with all no further work-up and therapy

## 2019-04-17 NOTE — Telephone Encounter (Signed)
Spoke with the pt and informed her of Dr. Francesca Oman recommendations and that she agrees with her current plan of care. Pt verbalized understanding and agrees with this plan. Pt gracious for all the assistance provided.

## 2019-04-22 ENCOUNTER — Ambulatory Visit (INDEPENDENT_AMBULATORY_CARE_PROVIDER_SITE_OTHER): Payer: Medicare Other | Admitting: Neurology

## 2019-04-22 ENCOUNTER — Encounter: Payer: Self-pay | Admitting: Neurology

## 2019-04-22 ENCOUNTER — Other Ambulatory Visit: Payer: Self-pay

## 2019-04-22 DIAGNOSIS — G4489 Other headache syndrome: Secondary | ICD-10-CM | POA: Diagnosis not present

## 2019-04-22 DIAGNOSIS — I701 Atherosclerosis of renal artery: Secondary | ICD-10-CM | POA: Diagnosis not present

## 2019-04-22 NOTE — Progress Notes (Signed)
Reason for visit: Headache  Katherine Greer is an 68 y.o. female  History of present illness:  Katherine Greer is a 68 year old right-handed white female with a history of headaches.  The patient had severe headaches when seen initially November 2019, she was placed on gabapentin and had excellent improvement in her headaches within 3 days.  She remains on a very low-dose taking a total of 300 mg daily.  She does have chronic renal insufficiency, she was found to have renal artery stenosis and may be going for angioplasty in the near future.  She is hesitant to come off of the gabapentin even though she is not having any headaches at this point.  She returns to this office for an evaluation.  Past Medical History:  Diagnosis Date  . Abdominal bloating 05/03/2018  . Allergy   . Arthritis   . Asthma   . Cataract   . Colon polyp   . GERD (gastroesophageal reflux disease)   . Heart murmur   . Osteopenia   . Pill dysphagia 05/03/2018  . SOB (shortness of breath) 05/03/2018  . Thyroid disease   . UTI (urinary tract infection)     Past Surgical History:  Procedure Laterality Date  . FOOT SURGERY Bilateral     Family History  Problem Relation Age of Onset  . Cancer Son        Pituitary- Metastatic Brain  . Heart attack Mother   . Diabetes Father   . Chronic Renal Failure Father   . Lung cancer Father   . Breast cancer Sister   . Diabetes Brother   . Colon cancer Neg Hx   . Esophageal cancer Neg Hx   . Inflammatory bowel disease Neg Hx   . Liver disease Neg Hx   . Pancreatic cancer Neg Hx   . Stomach cancer Neg Hx   . Rectal cancer Neg Hx     Social history:  reports that she has quit smoking. She has never used smokeless tobacco. She reports current alcohol use. She reports that she does not use drugs.    Allergies  Allergen Reactions  . Augmentin [Amoxicillin-Pot Clavulanate] Anaphylaxis    Rash Has patient had a PCN reaction causing immediate rash, facial/tongue/throat  swelling, SOB or lightheadedness with hypotension: No Has patient had a PCN reaction causing severe rash involving mucus membranes or skin necrosis: Yes Has patient had a PCN reaction that required hospitalization: No Has patient had a PCN reaction occurring within the last 10 years: No If all of the above answers are "NO", then may proceed with Cephalosporin use.   Marland Kitchen Keflex [Cephalexin] Anaphylaxis  . Levaquin [Levofloxacin In D5w] Anaphylaxis  . Sulfa Antibiotics Anaphylaxis  . Depakote Er [Divalproex Sodium Er]     Nausea  . Latex Itching  . Topamax [Topiramate]     Nausea    Medications:  Prior to Admission medications   Medication Sig Start Date End Date Taking? Authorizing Provider  acetaminophen (TYLENOL) 500 MG tablet Take 1,000 mg by mouth every 6 (six) hours as needed for mild pain or moderate pain.     [provider]  atorvastatin (LIPITOR) 40 MG tablet Take 40 mg by mouth daily. 02/03/18   [provider]  carvedilol (COREG) 6.25 MG tablet Take 1 tablet (6.25 mg total) by mouth 2 (two) times daily. 12/10/18   Dorothy Spark, MD  Cholecalciferol 2000 units CAPS Take 2,000 Units by mouth daily. 02/08/15   [provider]  dextromethorphan-guaiFENesin (MUCINEX DM) 30-600 MG 12hr tablet Take 1 tablet by mouth as needed.     [provider]  famotidine-calcium carbonate-magnesium hydroxide (PEPCID COMPLETE) 10-800-165 MG chewable tablet Chew 1 tablet by mouth daily as needed.    [provider]  furosemide (LASIX) 40 MG tablet TAKE 1 TABLET BY MOUTH EVERY DAY 12/09/18   Dorothy Spark, MD  gabapentin (NEURONTIN) 100 MG capsule TAKE 1 CAPSULE EVERY MORNING AND 2 CAPSULES IN THE EVENING 04/07/19   Kathrynn Ducking, MD  halobetasol (ULTRAVATE) 0.05 % cream APPLY TO ITCHY AREAS DAILY AVOIDING NORMAL SKIN 11/27/18   [provider]  levothyroxine (SYNTHROID, LEVOTHROID) 112 MCG tablet Take 112 mcg by mouth daily before breakfast.     [provider]  nicotine polacrilex (COMMIT) 2 MG lozenge Take 2 mg by mouth as needed for smoking cessation.    [provider]  spironolactone (ALDACTONE) 50 MG tablet Take 1 tablet (50 mg total) by mouth daily. 09/18/18   Dorothy Spark, MD  tolterodine (DETROL LA) 4 MG 24 hr capsule Take 4 mg by mouth daily. 02/15/15   [provider]    ROS:  Out of a complete 14 system review of symptoms, the patient complains only of the following symptoms, and all other reviewed systems are negative.  History of headache  Blood pressure 127/74, pulse 70, height 5\' 7"  (1.702 m), weight 200 lb (90.7 kg).  Physical Exam  General: The patient is alert and cooperative at the time of the examination.  Skin: No significant peripheral edema is noted.   Neurologic Exam  Mental status: The patient is alert and oriented x 3 at the time of the examination. The patient has apparent normal recent and remote memory, with an apparently normal attention span and concentration ability.   Cranial nerves: Facial symmetry is present. Speech is normal, no aphasia or dysarthria is noted. Extraocular movements are full. Visual fields are full.  Motor: The patient has good strength in all 4 extremities.  Sensory examination: Soft touch sensation is symmetric on the face, arms, and legs.  Coordination: The patient has good finger-nose-finger and heel-to-shin bilaterally.  Gait and station: The patient has a normal gait. Tandem gait is normal. Romberg is negative. No drift is seen.  Reflexes: Deep tendon reflexes are symmetric.   Assessment/Plan:  1.  History of headache, well controlled  The patient likely could come off of the gabapentin but she is somewhat hesitant to do this.  If she does decide to come off of the gabapentin, she will taper by 100 mg every week or 2 until she is off the drug.  She will follow-up in 6 months but if she decides to stop the gabapentin and does  well, she can cancel this revisit.  Greater than 50% of the visit was spent in counseling and coordination of care.  Face-to-face time with the patient was 20 minutes.   Jill Alexanders MD 04/22/2019 2:08 PM  [facility] [address] [place], [place] [address]  Phone [phone] Fax [phone]

## 2019-04-28 ENCOUNTER — Other Ambulatory Visit (HOSPITAL_COMMUNITY)
Admission: RE | Admit: 2019-04-28 | Discharge: 2019-04-28 | Disposition: A | Discharge status: Home / Self Care | Payer: Medicare Other | Source: Ambulatory Visit | Attending: Vascular Surgery | Admitting: Vascular Surgery

## 2019-04-28 DIAGNOSIS — Z01812 Encounter for preprocedural laboratory examination: Secondary | ICD-10-CM | POA: Diagnosis not present

## 2019-04-28 DIAGNOSIS — Z20828 Contact with and (suspected) exposure to other viral communicable diseases: Secondary | ICD-10-CM | POA: Diagnosis not present

## 2019-04-28 LAB — SARS CORONAVIRUS 2 (TAT 6-24 HRS): SARS Coronavirus 2: NEGATIVE

## 2019-04-30 ENCOUNTER — Ambulatory Visit (HOSPITAL_COMMUNITY)
Admission: RE | Admit: 2019-04-30 | Discharge: 2019-04-30 | Disposition: A | Discharge status: Home / Self Care | Payer: Medicare Other | Attending: Vascular Surgery | Admitting: Vascular Surgery

## 2019-04-30 ENCOUNTER — Encounter (HOSPITAL_COMMUNITY): Payer: Self-pay | Admitting: Vascular Surgery

## 2019-04-30 ENCOUNTER — Other Ambulatory Visit: Payer: Self-pay

## 2019-04-30 ENCOUNTER — Ambulatory Visit (HOSPITAL_COMMUNITY)
Admission: RE | Disposition: A | Discharge status: Home / Self Care | Payer: Self-pay | Source: Home / Self Care | Attending: Vascular Surgery

## 2019-04-30 DIAGNOSIS — Z79899 Other long term (current) drug therapy: Secondary | ICD-10-CM | POA: Insufficient documentation

## 2019-04-30 DIAGNOSIS — Z880 Allergy status to penicillin: Secondary | ICD-10-CM | POA: Diagnosis not present

## 2019-04-30 DIAGNOSIS — Z87891 Personal history of nicotine dependence: Secondary | ICD-10-CM | POA: Insufficient documentation

## 2019-04-30 DIAGNOSIS — K219 Gastro-esophageal reflux disease without esophagitis: Secondary | ICD-10-CM | POA: Diagnosis not present

## 2019-04-30 DIAGNOSIS — Z881 Allergy status to other antibiotic agents status: Secondary | ICD-10-CM | POA: Insufficient documentation

## 2019-04-30 DIAGNOSIS — Z882 Allergy status to sulfonamides status: Secondary | ICD-10-CM | POA: Diagnosis not present

## 2019-04-30 DIAGNOSIS — M8580 Other specified disorders of bone density and structure, unspecified site: Secondary | ICD-10-CM | POA: Insufficient documentation

## 2019-04-30 DIAGNOSIS — M1990 Unspecified osteoarthritis, unspecified site: Secondary | ICD-10-CM | POA: Insufficient documentation

## 2019-04-30 DIAGNOSIS — J45909 Unspecified asthma, uncomplicated: Secondary | ICD-10-CM | POA: Insufficient documentation

## 2019-04-30 DIAGNOSIS — Z803 Family history of malignant neoplasm of breast: Secondary | ICD-10-CM | POA: Diagnosis not present

## 2019-04-30 DIAGNOSIS — I10 Essential (primary) hypertension: Secondary | ICD-10-CM | POA: Insufficient documentation

## 2019-04-30 DIAGNOSIS — I701 Atherosclerosis of renal artery: Secondary | ICD-10-CM | POA: Insufficient documentation

## 2019-04-30 DIAGNOSIS — E079 Disorder of thyroid, unspecified: Secondary | ICD-10-CM | POA: Insufficient documentation

## 2019-04-30 HISTORY — PX: RENAL ANGIOGRAPHY: CATH118260

## 2019-04-30 HISTORY — PX: PERIPHERAL VASCULAR INTERVENTION: CATH118257

## 2019-04-30 LAB — POCT I-STAT, CHEM 8
BUN: 34 mg/dL — ABNORMAL HIGH (ref 8–23)
Calcium, Ion: 1.28 mmol/L (ref 1.15–1.40)
Chloride: 104 mmol/L (ref 98–111)
Creatinine, Ser: 1.5 mg/dL — ABNORMAL HIGH (ref 0.44–1.00)
Glucose, Bld: 122 mg/dL — ABNORMAL HIGH (ref 70–99)
HCT: 44 % (ref 36.0–46.0)
Hemoglobin: 15 g/dL (ref 12.0–15.0)
Potassium: 4.5 mmol/L (ref 3.5–5.1)
Sodium: 139 mmol/L (ref 135–145)
TCO2: 22 mmol/L (ref 22–32)

## 2019-04-30 SURGERY — RENAL ANGIOGRAPHY
Anesthesia: LOCAL | Laterality: Right

## 2019-04-30 MED ORDER — LIDOCAINE HCL (PF) 1 % IJ SOLN
INTRAMUSCULAR | Status: DC | PRN
  Administered 2019-04-30: 18 mL

## 2019-04-30 MED ORDER — SODIUM CHLORIDE 0.9 % IV SOLN: 250.0000 mL | INTRAVENOUS | Status: DC | PRN

## 2019-04-30 MED ORDER — LIDOCAINE HCL (PF) 1 % IJ SOLN
INTRAMUSCULAR | Status: AC
  Filled 2019-04-30: qty 30

## 2019-04-30 MED ORDER — FENTANYL CITRATE (PF) 100 MCG/2ML IJ SOLN
INTRAMUSCULAR | Status: AC
  Filled 2019-04-30: qty 2

## 2019-04-30 MED ORDER — ACETAMINOPHEN 325 MG PO TABS: 650.0000 mg | ORAL_TABLET | ORAL | Status: DC | PRN

## 2019-04-30 MED ORDER — MIDAZOLAM HCL 2 MG/2ML IJ SOLN
INTRAMUSCULAR | Status: AC
  Filled 2019-04-30: qty 2

## 2019-04-30 MED ORDER — HEPARIN (PORCINE) IN NACL 1000-0.9 UT/500ML-% IV SOLN
INTRAVENOUS | Status: AC
  Filled 2019-04-30: qty 1000

## 2019-04-30 MED ORDER — HEPARIN (PORCINE) IN NACL 1000-0.9 UT/500ML-% IV SOLN
INTRAVENOUS | Status: DC | PRN
  Administered 2019-04-30 (×2): 500 mL

## 2019-04-30 MED ORDER — CLOPIDOGREL BISULFATE 300 MG PO TABS
ORAL_TABLET | ORAL | Status: DC | PRN
  Administered 2019-04-30: 300 mg via ORAL

## 2019-04-30 MED ORDER — CLOPIDOGREL BISULFATE 75 MG PO TABS: 75.0000 mg | ORAL_TABLET | Freq: Every day | ORAL | 11 refills | Status: DC

## 2019-04-30 MED ORDER — SODIUM CHLORIDE 0.9% FLUSH: 3.0000 mL | Freq: Two times a day (BID) | INTRAVENOUS | Status: DC

## 2019-04-30 MED ORDER — ONDANSETRON HCL 4 MG/2ML IJ SOLN: 4.0000 mg | Freq: Four times a day (QID) | INTRAMUSCULAR | Status: DC | PRN

## 2019-04-30 MED ORDER — LABETALOL HCL 5 MG/ML IV SOLN: 10.0000 mg | INTRAVENOUS | Status: DC | PRN

## 2019-04-30 MED ORDER — HYDRALAZINE HCL 20 MG/ML IJ SOLN: 5.0000 mg | INTRAMUSCULAR | Status: DC | PRN

## 2019-04-30 MED ORDER — CLOPIDOGREL BISULFATE 75 MG PO TABS
ORAL_TABLET | ORAL | Status: AC
  Filled 2019-04-30: qty 4

## 2019-04-30 MED ORDER — HEPARIN SODIUM (PORCINE) 1000 UNIT/ML IJ SOLN
INTRAMUSCULAR | Status: DC | PRN
  Administered 2019-04-30: 9000 [IU] via INTRAVENOUS

## 2019-04-30 MED ORDER — SODIUM CHLORIDE 0.9% FLUSH: 3.0000 mL | INTRAVENOUS | Status: DC | PRN

## 2019-04-30 MED ORDER — CLOPIDOGREL BISULFATE 75 MG PO TABS: 300.0000 mg | ORAL_TABLET | Freq: Once | ORAL | Status: DC

## 2019-04-30 MED ORDER — SODIUM CHLORIDE 0.9 % WEIGHT BASED INFUSION: 1.0000 mL/kg/h | INTRAVENOUS | Status: DC

## 2019-04-30 MED ORDER — FENTANYL CITRATE (PF) 100 MCG/2ML IJ SOLN
INTRAMUSCULAR | Status: DC | PRN
  Administered 2019-04-30: 25 ug via INTRAVENOUS

## 2019-04-30 MED ORDER — SODIUM CHLORIDE 0.9 % IV SOLN
INTRAVENOUS | Status: DC
  Administered 2019-04-30: 09:00:00 via INTRAVENOUS

## 2019-04-30 MED ORDER — CLOPIDOGREL BISULFATE 75 MG PO TABS: 75.0000 mg | ORAL_TABLET | Freq: Every day | ORAL | Status: DC

## 2019-04-30 MED ORDER — IODIXANOL 320 MG/ML IV SOLN
INTRAVENOUS | Status: DC | PRN
  Administered 2019-04-30: 12:00:00 55 mL via INTRA_ARTERIAL

## 2019-04-30 MED ORDER — MIDAZOLAM HCL 2 MG/2ML IJ SOLN
INTRAMUSCULAR | Status: DC | PRN
  Administered 2019-04-30: 2 mg via INTRAVENOUS

## 2019-04-30 SURGICAL SUPPLY — 19 items
BALLN VIATRAC 4X15X135 (BALLOONS) ×2
BALLOON VIATRAC 4X15X135 (BALLOONS) ×1 IMPLANT
CATH OMNI FLUSH 5F 65CM (CATHETERS) ×2 IMPLANT
CLOSURE MYNX CONTROL 6F/7F (Vascular Products) ×2 IMPLANT
GUIDE CATH VISTA JR4 6F (CATHETERS) ×2 IMPLANT
KIT ENCORE 26 ADVANTAGE (KITS) ×2 IMPLANT
KIT MICROPUNCTURE NIT STIFF (SHEATH) ×2 IMPLANT
KIT PV (KITS) ×2 IMPLANT
SHEATH PINNACLE 5F 10CM (SHEATH) ×2 IMPLANT
SHEATH PINNACLE 6F 10CM (SHEATH) ×2 IMPLANT
SHEATH PROBE COVER 6X72 (BAG) ×2 IMPLANT
STENT HERCULINK RX 5.0X12X135 (Permanent Stent) ×2 IMPLANT
STOPCOCK MORSE 400PSI 3WAY (MISCELLANEOUS) ×2 IMPLANT
SYR MEDRAD MARK V 150ML (SYRINGE) ×2 IMPLANT
TRANSDUCER W/STOPCOCK (MISCELLANEOUS) ×2 IMPLANT
TRAY PV CATH (CUSTOM PROCEDURE TRAY) ×2 IMPLANT
TUBING CIL FLEX 10 FLL-RA (TUBING) ×2 IMPLANT
WIRE BENTSON .035X145CM (WIRE) ×2 IMPLANT
WIRE STABILIZER XS .014X180CM (WIRE) ×2 IMPLANT

## 2019-04-30 NOTE — Op Note (Signed)
Patient name: Katherine Greer MRN: [id number] DOB: [birth year] Sex: female  04/30/2019 Pre-operative Diagnosis: High-grade right renal artery stenosis Post-operative diagnosis:  Same Surgeon:  Marty Heck, MD Procedure Performed: 1.  Ultrasound-guided access of the right common femoral artery 2.  Aortogram with renal artery arteriogram 3.  Right renal artery angioplasty (4 mm x 15 mm Viatrac) 4.  Right renal artery stent placement (5 mm x 12 mm Herculink) 5.  Mynx closure of the right common femoral artery after limited right femoral arteriogram 6.  43 minutes of monitored moderate conscious sedation time  Indications: Patient is a 68 year old female who was recently seen in clinic by Dr. Donnetta Hutching for evaluation of high-grade right renal artery stenosis.  She had both duplex imaging as well as MRI that suggested high-grade right renal artery stenosis in setting of rising blood pressure as well as renal insufficiency with optimal medical management.  She presents after risks and benefits were discussed..  Findings:  Limited aortogram showed high-grade greater than 99% right renal artery stenosis at the ostium.  The left renal artery is widely patent.  After the right renal artery was cannulated with a 014 wire could not get the catheter to track into the renal artery.  I subsequently predilated the right renal artery lesion with a 4 mm Viatrac.  I then deployed a 5 mm Herculink stent.  There is no evidence of residual stenosis in the right renal artery and it is widely patent including the stent with no dissection or flow-limiting defect.  The right kidney is smaller than the left suggesting some atrophy.   Contrast: 55 mL  Procedure:  The patient was identified in the holding area and taken to room 8.  The patient was then placed supine on the table and prepped and draped in the usual sterile fashion.  A time out was called.  Ultrasound was used to evaluate the right common femoral artery.  It  was patent .  A digital ultrasound image was acquired.  A micropuncture needle was used to access the right common femoral artery under ultrasound guidance.  An 018 wire was advanced without resistance and a micropuncture sheath was placed.  The 018 wire was removed and a benson wire was placed.  The micropuncture sheath was exchanged for a 6 french sheath.  An omniflush catheter was advanced over the wire to the level of L-2.  An abdominal angiogram was obtained to evaluate both renal arteries as noted above.  Given a focal greater than 99% high-grade stenosis of the right renal artery, I decided to intervene.  Patient was given 9000 units of IV heparin at this time.  I subsequently used a JR4 6 [place] guide cath and cannulated the right renal artery ostium.  I got my 014 stabilizer wire to advance distal past the lesion.  I could not get my JR4 guide cath to advance past the lesion and decided that it had to be predilated.  I subsequent selected a 4 mm x 15 mm Viatrac that was placed through the guide cath into the right renal artery ostium and this was predilated to nominal pressure.  I subsequently got my guide catheter advanced out into the mid distal portion of the right renal artery.  Kept my wire in place and I then exchanged the balloon for a balloon expandable stent.  I subsequent placed a 5 mm x 12 mm Herculink at the right renal artery ostium and hung this into the aorta by 1  to 2 mm.  This was deployed exactly where we had marked it at the lesion  I did another hand-injection through the guide cath and the renal artery filled with no residual stenosis.  I then used the Herculink balloon (where the stent was mounted) and the stent was flared to to just past 5 mm where it hung out in the aorta.  One final injection through the catheter showed no residual stenosis with excellent filling of the right renal artery.  That point in time wires and catheters were removed.  I did a limited right femoral  arteriogram that showed good access and a mynx closure was deployed in the right common femoral artery.  She will be taken to recovery in stable condition.    Marty Heck, MD [facility] [place] Office: [phone] Pager: [place]

## 2019-04-30 NOTE — Discharge Instructions (Signed)

## 2019-04-30 NOTE — H&P (Signed)
History and Physical Interval Note:  04/30/2019 10:56 AM  Katherine Greer  has presented today for surgery, with the diagnosis of Maligant Hypertension.  The various methods of treatment have been discussed with the patient and family. After consideration of risks, benefits and other options for treatment, the patient has consented to  Procedure(s): RENAL ANGIOGRAPHY (Right) as a surgical intervention.  The patient's history has been reviewed, patient examined, no change in status, stable for surgery.  I have reviewed the patient's chart and labs.  Questions were answered to the patient's satisfaction.    Renal arteriogram and possible intervention.  Concern for right renal artery stenosis.  Marty Heck  [facility]  Patient name: Katherine Greer      MRN: [id number]        DOB: August 28, 1951            Sex: female  REASON FOR CONSULT: Evaluation of right renal artery stenosis  HPI: Katherine Greer is a 68 y.o. female, who is here for evaluation of right renal artery stenosis.  She is a very pleasant healthy female.  She reports that she began having severe headaches in December 2019.  Work-up revealed new onset of extreme hypertension.  She had treatment of the hypertension and then was found to have some mild renal insufficiency.  Further evaluation included renal ultrasound which showed a small right kidney compared to her left and also evidence of renal artery stenosis on the right.  She subsequently underwent MRI showing right renal artery stenosis at its origin.  She has no history of cardiac disease and no history of peripheral vascular occlusive disease.      Past Medical History:  Diagnosis Date  . Abdominal bloating 05/03/2018  . Allergy   . Arthritis   . Asthma   . Cataract   . Colon polyp   . GERD (gastroesophageal reflux disease)   . Heart murmur   . Osteopenia   . Pill dysphagia 05/03/2018  . SOB (shortness of breath) 05/03/2018  . Thyroid  disease   . UTI (urinary tract infection)          Family History  Problem Relation Age of Onset  . Cancer Son        Pituitary- Metastatic Brain  . Heart attack Mother   . Diabetes Father   . Chronic Renal Failure Father   . Lung cancer Father   . Breast cancer Sister   . Diabetes Brother   . Colon cancer Neg Hx   . Esophageal cancer Neg Hx   . Inflammatory bowel disease Neg Hx   . Liver disease Neg Hx   . Pancreatic cancer Neg Hx   . Stomach cancer Neg Hx   . Rectal cancer Neg Hx     SOCIAL HISTORY: Social History        Socioeconomic History  . Marital status: Married    Spouse name: Not on file  . Number of children: 2  . Years of education: Not on file  . Highest education level: Not on file  Occupational History  . 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 Financial resource strain: Not on file  . Food insecurity    Worry: Not on file    Inability: Not on file  . Transportation needs    Medical: Not on file    Non-medical: Not on file  Tobacco Use  . Smoking status: Former Research scientist (life sciences)  .  Smokeless tobacco: Never Used  . Tobacco comment: Quit 2015  Substance and Sexual Activity  . Alcohol use: Yes    Frequency: Never    Comment: wine 4 times a week  . Drug use: Never  . Sexual activity: Yes  Lifestyle  . Physical activity    Days per week: Not on file    Minutes per session: Not on file  . Stress: Not on file  Relationships  . Social Herbalist on phone: Not on file    Gets together: Not on file    Attends religious service: Not on file    Active member of club or organization: Not on file    Attends meetings of clubs or organizations: Not on file    Relationship status: Not on file  . Intimate partner violence    Fear of current or ex partner: Not on file    Emotionally abused: Not on file    Physically abused: Not on file    Forced sexual activity: Not  on file  Other Topics Concern  . Not on file  Social History Narrative  . Not on file         Allergies  Allergen Reactions  . Augmentin [Amoxicillin-Pot Clavulanate] Anaphylaxis    Rash Has patient had a PCN reaction causing immediate rash, facial/tongue/throat swelling, SOB or lightheadedness with hypotension: No Has patient had a PCN reaction causing severe rash involving mucus membranes or skin necrosis: Yes Has patient had a PCN reaction that required hospitalization: No Has patient had a PCN reaction occurring within the last 10 years: No If all of the above answers are "NO", then may proceed with Cephalosporin use.   Marland Kitchen Keflex [Cephalexin] Anaphylaxis  . Levaquin [Levofloxacin In D5w] Anaphylaxis  . Sulfa Antibiotics Anaphylaxis  . Depakote Er [Divalproex Sodium Er]     Nausea  . Latex Itching  . Topamax [Topiramate]     Nausea          Current Outpatient Medications  Medication Sig Dispense Refill  . acetaminophen (TYLENOL) 500 MG tablet Take 1,000 mg by mouth every 6 (six) hours as needed for mild pain or moderate pain.     Marland Kitchen atorvastatin (LIPITOR) 40 MG tablet Take 40 mg by mouth daily.  3  . carvedilol (COREG) 6.25 MG tablet Take 1 tablet (6.25 mg total) by mouth 2 (two) times daily. 180 tablet 2  . Cholecalciferol 2000 units CAPS Take 2,000 Units by mouth daily.    Marland Kitchen dextromethorphan-guaiFENesin (MUCINEX DM) 30-600 MG 12hr tablet Take 1 tablet by mouth as needed.     . famotidine-calcium carbonate-magnesium hydroxide (PEPCID COMPLETE) 10-800-165 MG chewable tablet Chew 1 tablet by mouth daily as needed.    . furosemide (LASIX) 40 MG tablet TAKE 1 TABLET BY MOUTH EVERY DAY 90 tablet 1  . gabapentin (NEURONTIN) 100 MG capsule TAKE 1 CAPSULE EVERY MORNING AND 2 CAPSULES IN THE EVENING 270 capsule 2  . halobetasol (ULTRAVATE) 0.05 % cream APPLY TO ITCHY AREAS DAILY AVOIDING NORMAL SKIN    . levothyroxine (SYNTHROID, LEVOTHROID) 112 MCG tablet Take  112 mcg by mouth daily before breakfast.    . nicotine polacrilex (COMMIT) 2 MG lozenge Take 2 mg by mouth as needed for smoking cessation.    Marland Kitchen spironolactone (ALDACTONE) 50 MG tablet Take 1 tablet (50 mg total) by mouth daily. 90 tablet 1  . tolterodine (DETROL LA) 4 MG 24 hr capsule Take 4 mg by mouth daily.  No current facility-administered medications for this visit.     REVIEW OF SYSTEMS:  [X]  denotes positive finding, [ ]  denotes negative finding Cardiac  Comments:  Chest pain or chest pressure:    Shortness of breath upon exertion: x   Short of breath when lying flat:    Irregular heart rhythm:        Vascular    Pain in calf, thigh, or hip brought on by ambulation:    Pain in feet at night that wakes you up from your sleep:     Blood clot in your veins:    Leg swelling:         Pulmonary    Oxygen at home:    Productive cough:     Wheezing:  x       Neurologic    Sudden weakness in arms or legs:     Sudden numbness in arms or legs:     Sudden onset of difficulty speaking or slurred speech:    Temporary loss of vision in one eye:     Problems with dizziness:         Gastrointestinal    Blood in stool:     Vomited blood:         Genitourinary    Burning when urinating:     Blood in urine:        Psychiatric    Major depression:         Hematologic    Bleeding problems:    Problems with blood clotting too easily:        Skin    Rashes or ulcers:        Constitutional    Fever or chills:      PHYSICAL EXAM:    Vitals:   04/15/19 1326  BP: 115/70  Pulse: 83  Resp: 12  Temp: 97.8 F (36.6 C)  TempSrc: Temporal  SpO2: 98%  Weight: 197 lb 11.2 oz (89.7 kg)  Height: 5\' 8"  (1.727 m)    GENERAL: The patient is a well-nourished female, in no acute distress. The vital signs are documented above. CARDIOVASCULAR: Carotid arteries without bruits  bilaterally.  2+ radial 2+ dorsalis pedis pulses bilaterally.  Abdomen soft with no abdominal bruit noted PULMONARY: There is good air exchange  ABDOMEN: Soft and non-tender  MUSCULOSKELETAL: There are no major deformities or cyanosis. NEUROLOGIC: No focal weakness or paresthesias are detected. SKIN: There are no ulcers or rashes noted. PSYCHIATRIC: The patient has a normal affect.  DATA:  I reviewed her renal duplex from 02/13/2019.  Also reviewed her MRI.  Both of these suggest high-grade stenosis at the origin.  Her right kidney is 9 cm in length and left kidney is 12 cm in length.  MEDICAL ISSUES: Had a very long discussion with the patient regarding this.  Explained is impossible to know if her renal artery stenosis is causing the hypertension or renal insufficiency or even the discrepancy in renal size.  She should continue to have a functional kidney at 9 cm.  I have recommended intervention.  Explained that this would require a very slight amount of iodinated contrast with right femoral artery puncture and a very slight risk of injury to the artery.  She understands and wishes to proceed with surgery.  We have scheduled this with Dr. Carlis Abbott.  I did introduce her to Dr. Carlis Abbott in the office as well today.  We will schedule this at her earliest [place].  Early, MD FACS [facility] Tel [phone] Pager [phone]

## 2019-05-01 MED FILL — Clopidogrel Bisulfate Tab 75 MG (Base Equiv): ORAL | Qty: 4 | Status: AC

## 2019-05-02 ENCOUNTER — Encounter: Payer: Self-pay | Admitting: *Deleted

## 2019-05-02 DIAGNOSIS — H10413 Chronic giant papillary conjunctivitis, bilateral: Secondary | ICD-10-CM | POA: Diagnosis not present

## 2019-05-06 ENCOUNTER — Ambulatory Visit (HOSPITAL_COMMUNITY)
Admission: RE | Admit: 2019-05-06 | Discharge: 2019-05-06 | Disposition: A | Discharge status: Home / Self Care | Payer: Medicare Other | Source: Ambulatory Visit | Attending: Vascular Surgery | Admitting: Vascular Surgery

## 2019-05-06 ENCOUNTER — Ambulatory Visit (INDEPENDENT_AMBULATORY_CARE_PROVIDER_SITE_OTHER): Payer: Self-pay | Admitting: Vascular Surgery

## 2019-05-06 ENCOUNTER — Encounter: Payer: Self-pay | Admitting: Vascular Surgery

## 2019-05-06 ENCOUNTER — Other Ambulatory Visit: Payer: Self-pay

## 2019-05-06 ENCOUNTER — Other Ambulatory Visit (HOSPITAL_COMMUNITY): Payer: Self-pay | Admitting: Vascular Surgery

## 2019-05-06 DIAGNOSIS — R1909 Other intra-abdominal and pelvic swelling, mass and lump: Secondary | ICD-10-CM | POA: Insufficient documentation

## 2019-05-06 DIAGNOSIS — I701 Atherosclerosis of renal artery: Secondary | ICD-10-CM

## 2019-05-06 NOTE — Progress Notes (Signed)
Patient name: Katherine Greer MRN: [id number] DOB: 1951/04/18 Sex: female  REASON FOR VISIT: Groin check after right femoral access for renal stent  HPI: Katherine Greer is a 68 y.o. female that presents for right groin check after right common femoral artery access for right renal artery stent last week.  Patient noticed a marble sized swelling in the right groin that was painful.  She called multiple times over the weekend and was unable to talk to surgeon on call.  She states she has no pain except when she pushes on the area.  Was planning to go out of town today for her birthday.  Past Medical History:  Diagnosis Date  . Abdominal bloating 05/03/2018  . Allergy   . Arthritis   . Asthma   . Cataract   . Colon polyp   . GERD (gastroesophageal reflux disease)   . Heart murmur   . Osteopenia   . Pill dysphagia 05/03/2018  . SOB (shortness of breath) 05/03/2018  . Thyroid disease   . UTI (urinary tract infection)     Past Surgical History:  Procedure Laterality Date  . FOOT SURGERY Bilateral   . PERIPHERAL VASCULAR INTERVENTION Right 04/30/2019   Procedure: PERIPHERAL VASCULAR INTERVENTION;  Surgeon: Marty Heck, MD;  Location: [facility];  Service: Cardiovascular;  Laterality: Right;  Renal artery  . RENAL ANGIOGRAPHY Right 04/30/2019   Procedure: RENAL ANGIOGRAPHY;  Surgeon: Marty Heck, MD;  Location: [facility];  Service: Cardiovascular;  Laterality: Right;    Family History  Problem Relation Age of Onset  . Cancer Son        Pituitary- Metastatic Brain  . Heart attack Mother   . Diabetes Father   . Chronic Renal Failure Father   . Lung cancer Father   . Breast cancer Sister   . Diabetes Brother   . Colon cancer Neg Hx   . Esophageal cancer Neg Hx   . Inflammatory bowel disease Neg Hx   . Liver disease Neg Hx   . Pancreatic cancer Neg Hx   . Stomach cancer Neg Hx   . Rectal cancer Neg Hx     SOCIAL HISTORY: Social History   Tobacco Use  .  Smoking status: Former Research scientist (life sciences)  . Smokeless tobacco: Never Used  . Tobacco comment: Quit 2015  Substance Use Topics  . Alcohol use: Yes    Frequency: Never    Comment: wine 4 times a week    Allergies  Allergen Reactions  . Augmentin [Amoxicillin-Pot Clavulanate] Anaphylaxis and Rash    Did it involve swelling of the face/tongue/throat, SOB, or low BP? Yes Did it involve sudden or severe rash/hives, skin peeling, or any reaction on the inside of your mouth or nose? Yes Did you need to seek medical attention at a hospital or doctor's office? No When did it last happen?18 years If all above answers are "NO", may proceed with cephalosporin use.   Marland Kitchen Keflex [Cephalexin] Anaphylaxis  . Levaquin [Levofloxacin In D5w] Anaphylaxis  . Sulfa Antibiotics Anaphylaxis and Rash  . Contrast Media [Iodinated Diagnostic Agents]     Lowered kidney function   . Depakote Er [Divalproex Sodium Er] Nausea Only  . Latex Itching  . Nickel Hives    Topical dermatitis sees Dr. Wilhemina Bonito   . Nsaids     Avoid due to kidney function   . Tobramycin Swelling    redness  . Topamax [Topiramate]     unknown  Current Outpatient Medications  Medication Sig Dispense Refill  . acetaminophen (TYLENOL) 500 MG tablet Take 1,000 mg by mouth every 6 (six) hours as needed for mild pain or moderate pain.     Marland Kitchen atorvastatin (LIPITOR) 40 MG tablet Take 40 mg by mouth at bedtime.   3  . carvedilol (COREG) 6.25 MG tablet Take 1 tablet (6.25 mg total) by mouth 2 (two) times daily. 180 tablet 2  . Cholecalciferol 2000 units CAPS Take 2,000 Units by mouth at bedtime.     . cimetidine (TAGAMET) 200 MG tablet Take 200 mg by mouth daily as needed (acid reflux).    . clopidogrel (PLAVIX) 75 MG tablet Take 1 tablet (75 mg total) by mouth daily. 30 tablet 11  . fluticasone (FLONASE) 50 MCG/ACT nasal spray Place 1 spray into both nostrils daily as needed for allergies or rhinitis.    . furosemide (LASIX) 40 MG tablet TAKE 1  TABLET BY MOUTH EVERY DAY (Patient taking differently: Take 40 mg by mouth daily. ) 90 tablet 1  . gabapentin (NEURONTIN) 100 MG capsule TAKE 1 CAPSULE EVERY MORNING AND 2 CAPSULES IN THE EVENING (Patient taking differently: Take 100-200 mg by mouth See admin instructions. Take 100 mg in the morning and 200 mg at night) 270 capsule 2  . halobetasol (ULTRAVATE) 0.05 % cream Apply 1 application topically 2 (two) times daily as needed (dermatitis).     Marland Kitchen levothyroxine (SYNTHROID, LEVOTHROID) 112 MCG tablet Take 112 mcg by mouth daily before breakfast.    . nicotine polacrilex (COMMIT) 4 MG lozenge Take 4 mg by mouth as needed for smoking cessation.    Marland Kitchen spironolactone (ALDACTONE) 50 MG tablet Take 1 tablet (50 mg total) by mouth daily. 90 tablet 1  . tolterodine (DETROL LA) 4 MG 24 hr capsule Take 4 mg by mouth daily.    Marland Kitchen triamcinolone cream (KENALOG) 0.1 % Apply 1 application topically 2 (two) times daily as needed (dermatitis).    . prednisoLONE acetate (PRED FORTE) 1 % ophthalmic suspension Place 1 drop into both eyes 2 (two) times daily.     No current facility-administered medications for this visit.     REVIEW OF SYSTEMS:  [X]  denotes positive finding, [ ]  denotes negative finding Cardiac  Comments:  Chest pain or chest pressure:    Shortness of breath upon exertion:    Short of breath when lying flat:    Irregular heart rhythm:        Vascular    Pain in calf, thigh, or hip brought on by ambulation:    Pain in feet at night that wakes you up from your sleep:     Blood clot in your veins:    Leg swelling:         Pulmonary    Oxygen at home:    Productive cough:     Wheezing:         Neurologic    Sudden weakness in arms or legs:     Sudden numbness in arms or legs:     Sudden onset of difficulty speaking or slurred speech:    Temporary loss of vision in one eye:     Problems with dizziness:         Gastrointestinal    Blood in stool:     Vomited blood:          Genitourinary    Burning when urinating:     Blood in urine:  Psychiatric    Major depression:         Hematologic    Bleeding problems:    Problems with blood clotting too easily:        Skin    Rashes or ulcers:        Constitutional    Fever or chills:      PHYSICAL EXAM: Vitals:   05/06/19 1044  BP: 129/75  Pulse: 78  Resp: 16  Temp: 98.2 F (36.8 C)  TempSrc: Temporal  SpO2: 97%  Weight: 198 lb (89.8 kg)  Height: 5\' 7"  (1.702 m)    GENERAL: The patient is a well-nourished female, in no acute distress. The vital signs are documented above. CARDIAC: There is a regular rate and rhythm.  VASCULAR:  Marble size knot in right groin Palpable DP/PT pulses right foot  DATA:   I independently reviewed her right groin duplex and I see no evidence of pseudoaneurysm  Assessment/Plan:  68 year old female presents for right groin check after noting some swelling and pain in the right groin.  On exam she has likely a small hematoma here.  I reviewed her arterial duplex and there is no evidence of pseudoaneurysm.  She has palpable dorsalis pedis and posterior tibial pulses in the right foot.  Offered her reassurance and expect this will improve with time.  I think it is safe for her to travel for her birthday. She has scheduled follow-up with me in 1 month that we will plan to keep at this time.   Marty Heck, MD [facility] [place] Office: [phone] Pager: [phone]

## 2019-05-09 DIAGNOSIS — M67911 Unspecified disorder of synovium and tendon, right shoulder: Secondary | ICD-10-CM | POA: Diagnosis not present

## 2019-05-11 DIAGNOSIS — Z23 Encounter for immunization: Secondary | ICD-10-CM | POA: Diagnosis not present

## 2019-05-13 DIAGNOSIS — N183 Chronic kidney disease, stage 3 (moderate): Secondary | ICD-10-CM | POA: Diagnosis not present

## 2019-05-21 DIAGNOSIS — N183 Chronic kidney disease, stage 3 (moderate): Secondary | ICD-10-CM | POA: Diagnosis not present

## 2019-05-21 DIAGNOSIS — I701 Atherosclerosis of renal artery: Secondary | ICD-10-CM | POA: Diagnosis not present

## 2019-05-21 DIAGNOSIS — I5032 Chronic diastolic (congestive) heart failure: Secondary | ICD-10-CM | POA: Diagnosis not present

## 2019-05-21 DIAGNOSIS — I129 Hypertensive chronic kidney disease with stage 1 through stage 4 chronic kidney disease, or unspecified chronic kidney disease: Secondary | ICD-10-CM | POA: Diagnosis not present

## 2019-05-28 ENCOUNTER — Telehealth: Payer: Self-pay | Admitting: Cardiology

## 2019-05-28 NOTE — Telephone Encounter (Signed)
  Patient is calling because she was put on blood pressure medications for her headaches but she wants to know if she should continue taking them. Her blood pressure has been running normal to low. She also recently had a stent put in due to kidney issues. Please advise.

## 2019-05-28 NOTE — Telephone Encounter (Signed)
Pt is calling to inform Dr. Meda Coffee that she had her renal artery procedure, which Dr. Carlis Abbott with [facility] determined was 99% blocked.  Pt states she recently had a stent placed in her right renal artery.  Pt wanted to ask Dr. Meda Coffee if she should continue taking her BP medications, being she had a successful procedure to her right renal artery.  Pt states her BPs are running from 110/57 to 120/70s.  Pt states she has no more headaches, since the procedure, being her pressures are so well managed now.  Pt wants to know if taking her BP medications is long-term vs short term, will she ever have the meds reduced in dose?  Pt states she prefers not taking medications, so if she could safely get off her BP medications or have them reduced, that would be great.  Pt states she has follow-up with Dr. Carlis Abbott with [facility] on next Tuesday 10/6, and will inquire this concern with him as well. Pt also wanted to get perspective from Dr. Meda Coffee as well. Informed the pt that I will route this communication to Dr. Meda Coffee to further review and advise on, and follow-up with the pt thereafter, once recommendations received.  Advised the pt that in the meantime, she should continue her current med regimen as is, and follow-up as planned with Dr. Carlis Abbott, and with our office on 11/11 to see Estella Husk PA-C.  Pt verbalized understanding and agrees with this plan.

## 2019-05-29 ENCOUNTER — Other Ambulatory Visit: Payer: Self-pay

## 2019-05-29 DIAGNOSIS — I701 Atherosclerosis of renal artery: Secondary | ICD-10-CM

## 2019-05-29 NOTE — Telephone Encounter (Signed)
Have patient check BP daily on curent meds for a week and call Dr. Meda Coffee back with results

## 2019-05-30 DIAGNOSIS — M67911 Unspecified disorder of synovium and tendon, right shoulder: Secondary | ICD-10-CM | POA: Diagnosis not present

## 2019-05-30 NOTE — Telephone Encounter (Signed)
Spoke with the pt and informed her that per Dr Radford Pax, covering for Dr Meda Coffee, she advised that the pt should check her BP daily while on current meds, write this down, and call us in one week to report to Dr. Meda Coffee, so that safe determination on adjusting or stopping her BP meds can be made.  Pt verbalized understanding and agrees with this plan.

## 2019-06-03 ENCOUNTER — Ambulatory Visit (INDEPENDENT_AMBULATORY_CARE_PROVIDER_SITE_OTHER): Payer: Medicare Other | Admitting: Vascular Surgery

## 2019-06-03 ENCOUNTER — Encounter: Payer: Self-pay | Admitting: Vascular Surgery

## 2019-06-03 ENCOUNTER — Other Ambulatory Visit: Payer: Self-pay

## 2019-06-03 ENCOUNTER — Ambulatory Visit (HOSPITAL_COMMUNITY)
Admission: RE | Admit: 2019-06-03 | Discharge: 2019-06-03 | Disposition: A | Discharge status: Home / Self Care | Payer: Medicare Other | Source: Ambulatory Visit | Attending: Vascular Surgery | Admitting: Vascular Surgery

## 2019-06-03 VITALS — BP 103/54 | HR 68 | Temp 97.4°F | Resp 16 | Ht 67.0 in | Wt 198.0 lb

## 2019-06-03 DIAGNOSIS — I701 Atherosclerosis of renal artery: Secondary | ICD-10-CM

## 2019-06-03 NOTE — Progress Notes (Signed)
Patient name: Katherine Greer MRN: [id number] DOB: [birth year] Sex: female  REASON FOR VISIT: Follow-up after right renal artery stent for high-grade renal artery stenosis  HPI: Katherine Greer is a 68 y.o. female presents for one-month follow-up after right renal artery stent for high-grade stenosis.  Her right renal artery was placed on 04/30/2019 via right common femoral artery approach.  Postop she had some discomfort in her right groin that we evaluated and this has since resolved.  She states she still is on a little bit of blood pressure medicine with carvedilol and some low-dose Spironolactone.  She said she had some renal labs drawn at her nephrologist office recently, but I do not have these results.  Past Medical History:  Diagnosis Date  . Abdominal bloating 05/03/2018  . Allergy   . Arthritis   . Asthma   . Cataract   . Colon polyp   . GERD (gastroesophageal reflux disease)   . Heart murmur   . Osteopenia   . Pill dysphagia 05/03/2018  . SOB (shortness of breath) 05/03/2018  . Thyroid disease   . UTI (urinary tract infection)     Past Surgical History:  Procedure Laterality Date  . FOOT SURGERY Bilateral   . PERIPHERAL VASCULAR INTERVENTION Right 04/30/2019   Procedure: PERIPHERAL VASCULAR INTERVENTION;  Surgeon: Marty Heck, MD;  Location: [facility];  Service: Cardiovascular;  Laterality: Right;  Renal artery  . RENAL ANGIOGRAPHY Right 04/30/2019   Procedure: RENAL ANGIOGRAPHY;  Surgeon: Marty Heck, MD;  Location: [facility];  Service: Cardiovascular;  Laterality: Right;    Family History  Problem Relation Age of Onset  . Cancer Son        Pituitary- Metastatic Brain  . Heart attack Mother   . Diabetes Father   . Chronic Renal Failure Father   . Lung cancer Father   . Breast cancer Sister   . Diabetes Brother   . Colon cancer Neg Hx   . Esophageal cancer Neg Hx   . Inflammatory bowel disease Neg Hx   . Liver disease Neg Hx   . Pancreatic  cancer Neg Hx   . Stomach cancer Neg Hx   . Rectal cancer Neg Hx     SOCIAL HISTORY: Social History   Tobacco Use  . Smoking status: Former Research scientist (life sciences)  . Smokeless tobacco: Never Used  . Tobacco comment: Quit 2015  Substance Use Topics  . Alcohol use: Yes    Frequency: Never    Comment: wine 4 times a week    Allergies  Allergen Reactions  . Augmentin [Amoxicillin-Pot Clavulanate] Anaphylaxis and Rash    Did it involve swelling of the face/tongue/throat, SOB, or low BP? Yes Did it involve sudden or severe rash/hives, skin peeling, or any reaction on the inside of your mouth or nose? Yes Did you need to seek medical attention at a hospital or doctor's office? No When did it last happen?18 years If all above answers are "NO", may proceed with cephalosporin use.   Marland Kitchen Keflex [Cephalexin] Anaphylaxis  . Levaquin [Levofloxacin In D5w] Anaphylaxis  . Sulfa Antibiotics Anaphylaxis and Rash  . Contrast Media [Iodinated Diagnostic Agents]     Lowered kidney function   . Depakote Er [Divalproex Sodium Er] Nausea Only  . Latex Itching  . Nickel Hives    Topical dermatitis sees Dr. Wilhemina Bonito   . Nsaids     Avoid due to kidney function   . Tobramycin Swelling  redness  . Topamax [Topiramate]     unknown    Current Outpatient Medications  Medication Sig Dispense Refill  . acetaminophen (TYLENOL) 500 MG tablet Take 1,000 mg by mouth every 6 (six) hours as needed for mild pain or moderate pain.     Marland Kitchen atorvastatin (LIPITOR) 40 MG tablet Take 40 mg by mouth at bedtime.   3  . carvedilol (COREG) 6.25 MG tablet Take 1 tablet (6.25 mg total) by mouth 2 (two) times daily. 180 tablet 2  . Cholecalciferol 2000 units CAPS Take 2,000 Units by mouth at bedtime.     . cimetidine (TAGAMET) 200 MG tablet Take 200 mg by mouth daily as needed (acid reflux).    . clopidogrel (PLAVIX) 75 MG tablet Take 1 tablet (75 mg total) by mouth daily. 30 tablet 11  . fluticasone (FLONASE) 50 MCG/ACT nasal  spray Place 1 spray into both nostrils daily as needed for allergies or rhinitis.    . furosemide (LASIX) 40 MG tablet TAKE 1 TABLET BY MOUTH EVERY DAY (Patient taking differently: Take 40 mg by mouth daily. ) 90 tablet 1  . gabapentin (NEURONTIN) 100 MG capsule TAKE 1 CAPSULE EVERY MORNING AND 2 CAPSULES IN THE EVENING (Patient taking differently: Take 100-200 mg by mouth See admin instructions. Take 100 mg in the morning and 200 mg at night) 270 capsule 2  . halobetasol (ULTRAVATE) 0.05 % cream Apply 1 application topically 2 (two) times daily as needed (dermatitis).     Marland Kitchen levothyroxine (SYNTHROID, LEVOTHROID) 112 MCG tablet Take 112 mcg by mouth daily before breakfast.    . nicotine polacrilex (COMMIT) 4 MG lozenge Take 4 mg by mouth as needed for smoking cessation.    . prednisoLONE acetate (PRED FORTE) 1 % ophthalmic suspension Place 1 drop into both eyes 2 (two) times daily.    Marland Kitchen spironolactone (ALDACTONE) 50 MG tablet Take 1 tablet (50 mg total) by mouth daily. 90 tablet 1  . tolterodine (DETROL LA) 4 MG 24 hr capsule Take 4 mg by mouth daily.    Marland Kitchen triamcinolone cream (KENALOG) 0.1 % Apply 1 application topically 2 (two) times daily as needed (dermatitis).     No current facility-administered medications for this visit.     REVIEW OF SYSTEMS:  [X]  denotes positive finding, [ ]  denotes negative finding Cardiac  Comments:  Chest pain or chest pressure:    Shortness of breath upon exertion:    Short of breath when lying flat:    Irregular heart rhythm:        Vascular    Pain in calf, thigh, or hip brought on by ambulation:    Pain in feet at night that wakes you up from your sleep:     Blood clot in your veins:    Leg swelling:         Pulmonary    Oxygen at home:    Productive cough:     Wheezing:         Neurologic    Sudden weakness in arms or legs:     Sudden numbness in arms or legs:     Sudden onset of difficulty speaking or slurred speech:    Temporary loss of vision  in one eye:     Problems with dizziness:         Gastrointestinal    Blood in stool:     Vomited blood:         Genitourinary    Burning when urinating:  Blood in urine:        Psychiatric    Major depression:         Hematologic    Bleeding problems:    Problems with blood clotting too easily:        Skin    Rashes or ulcers:        Constitutional    Fever or chills:      PHYSICAL EXAM: Vitals:   06/03/19 0851  BP: (!) 103/54  Pulse: 68  Resp: 16  Temp: (!) 97.4 F (36.3 C)  TempSrc: Temporal  SpO2: 97%  Weight: 198 lb (89.8 kg)  Height: 5\' 7"  (1.702 m)    GENERAL: The patient is a well-nourished female, in no acute distress. The vital signs are documented above. Vascular: right femoral pulse palpable, no hematoma  DATA:   Right renal artery stenosis resolved since placement of the right renal artery stent.  Right kidney is small as previously documented.  Assessment/Plan:  68 year old female who had a high-grade right renal artery stenosis.  She is now status post angioplasty and stent placement on 04/30/2019.  Based on duplex today the renal artery stenosis is resolved.  She is still on Coreg and some low-dose of spironolactone as relates to her blood pressure and discussed again most patients only see moderate improvement in BP and still require medications usually.  Sound like she is having less fluctuations in her blood pressure since intervention.  I do not have any updated renal function labs but she states it was recently checked at her nephrology office. I will see her again in 9 months with another renal artery duplex for ongoing surveillance.  She cannot tolerate an aspirin so recommend staying on Plavix at this time.   Marty Heck, MD [facility] Office: [phone] Pager: [phone]

## 2019-06-04 ENCOUNTER — Other Ambulatory Visit: Payer: Self-pay | Admitting: Orthopedic Surgery

## 2019-06-04 DIAGNOSIS — M25511 Pain in right shoulder: Secondary | ICD-10-CM

## 2019-06-16 ENCOUNTER — Telehealth: Payer: Self-pay | Admitting: Cardiology

## 2019-06-16 NOTE — Telephone Encounter (Signed)
Jamelle Rushing L     06/16/19 3:37 PM Note   New message:     Patient calling reporting BP 10/01 night 123/59 pulse 84, 10/02 night 128/62 pulse 91, 10/03 night 139/59 pulse 68, 10/04 morning 106/51 pulse 77, 10/05 night 126/59 pulse 93, 10/06 morning 116/59 pulse 75, 10/08 night 115/51 pulse 80, 10/10 morning before medication 129/59 pulse 91 10/12 113/54 pulse 68 at night.

## 2019-06-16 NOTE — Telephone Encounter (Signed)
Dr. Meda Coffee, this pt is calling back as we advised her to, and noted in this encounter, to provide [place] with a couple weeks worth of logged BP/HR readings.  Pt had her renal artery procedure done a couple weeks ago, and being it was successful, she was then at that time inquiring to come off of some of her BP medications.  We advised her before we could do that, we would need to see a couple weeks worth of readings, to safely titrate those meds.  Highlighted in BOLD, is her BP/HR readings.  Please review and advise on these readings, and any further medication changes, decrease in doses, or discontinuing any meds.

## 2019-06-16 NOTE — Telephone Encounter (Signed)
Placed these recordings in previous open encounter regarding this issue.

## 2019-06-16 NOTE — Telephone Encounter (Signed)
Spoke with the pt and informed her that per Dr Meda Coffee, we will discontinue her lasix for now, and she should send [place] some more numbers in a couple weeks. Discontinued this out of the pts med list.  Pt verbalized understanding and agrees with this plan.

## 2019-06-16 NOTE — Telephone Encounter (Signed)
New message:     Patient calling reporting BP 10/01 night 123/59 pulse 84, 10/02 night 128/62 pulse 91, 10/03 night 139/59 pulse 68, 10/04 morning 106/51 pulse 77, 10/05 night 126/59 pulse 93, 10/06 morning 116/59 pulse 75, 10/08 night 115/51 pulse 80, 10/10 morning before medication 129/59 pulse 91 10/12 113/54 pulse 68 at night.

## 2019-06-16 NOTE — Telephone Encounter (Signed)
She can discontinue Lasix for now and send [place] some more numbers in couple weeks.

## 2019-06-16 NOTE — Addendum Note (Signed)
Addended by: Nuala Alpha on: 06/16/2019 04:10 PM   Modules accepted: Orders

## 2019-06-23 DIAGNOSIS — N289 Disorder of kidney and ureter, unspecified: Secondary | ICD-10-CM | POA: Insufficient documentation

## 2019-06-23 DIAGNOSIS — E785 Hyperlipidemia, unspecified: Secondary | ICD-10-CM | POA: Insufficient documentation

## 2019-06-23 DIAGNOSIS — M1990 Unspecified osteoarthritis, unspecified site: Secondary | ICD-10-CM | POA: Insufficient documentation

## 2019-06-23 DIAGNOSIS — Z6832 Body mass index (BMI) 32.0-32.9, adult: Secondary | ICD-10-CM | POA: Diagnosis not present

## 2019-06-23 DIAGNOSIS — Z01419 Encounter for gynecological examination (general) (routine) without abnormal findings: Secondary | ICD-10-CM | POA: Diagnosis not present

## 2019-06-23 DIAGNOSIS — G44009 Cluster headache syndrome, unspecified, not intractable: Secondary | ICD-10-CM | POA: Insufficient documentation

## 2019-06-26 ENCOUNTER — Other Ambulatory Visit: Payer: Medicare Other

## 2019-06-30 ENCOUNTER — Other Ambulatory Visit: Payer: Medicare Other

## 2019-07-01 ENCOUNTER — Other Ambulatory Visit: Payer: Medicare Other

## 2019-07-01 ENCOUNTER — Other Ambulatory Visit: Payer: Self-pay

## 2019-07-01 DIAGNOSIS — I2510 Atherosclerotic heart disease of native coronary artery without angina pectoris: Secondary | ICD-10-CM

## 2019-07-01 DIAGNOSIS — E785 Hyperlipidemia, unspecified: Secondary | ICD-10-CM

## 2019-07-01 DIAGNOSIS — I5030 Unspecified diastolic (congestive) heart failure: Secondary | ICD-10-CM | POA: Diagnosis not present

## 2019-07-01 DIAGNOSIS — I10 Essential (primary) hypertension: Secondary | ICD-10-CM

## 2019-07-01 DIAGNOSIS — E7849 Other hyperlipidemia: Secondary | ICD-10-CM | POA: Diagnosis not present

## 2019-07-01 DIAGNOSIS — E038 Other specified hypothyroidism: Secondary | ICD-10-CM | POA: Diagnosis not present

## 2019-07-02 ENCOUNTER — Telehealth: Payer: Self-pay | Admitting: Cardiology

## 2019-07-02 LAB — CBC WITH DIFFERENTIAL/PLATELET
Basophils Absolute: 0.1 10*3/uL (ref 0.0–0.2)
Basos: 1 %
EOS (ABSOLUTE): 0.1 10*3/uL (ref 0.0–0.4)
Eos: 2 %
Hematocrit: 36.9 % (ref 34.0–46.6)
Hemoglobin: 12.9 g/dL (ref 11.1–15.9)
Immature Grans (Abs): 0 10*3/uL (ref 0.0–0.1)
Immature Granulocytes: 1 %
Lymphocytes Absolute: 1.4 10*3/uL (ref 0.7–3.1)
Lymphs: 23 %
MCH: 31.3 pg (ref 26.6–33.0)
MCHC: 35 g/dL (ref 31.5–35.7)
MCV: 90 fL (ref 79–97)
Monocytes Absolute: 0.7 10*3/uL (ref 0.1–0.9)
Monocytes: 11 %
Neutrophils Absolute: 3.7 10*3/uL (ref 1.4–7.0)
Neutrophils: 62 %
Platelets: 295 10*3/uL (ref 150–450)
RBC: 4.12 x10E6/uL (ref 3.77–5.28)
RDW: 11.9 % (ref 11.7–15.4)
WBC: 6 10*3/uL (ref 3.4–10.8)

## 2019-07-02 LAB — COMPREHENSIVE METABOLIC PANEL
ALT: 44 IU/L — ABNORMAL HIGH (ref 0–32)
AST: 26 IU/L (ref 0–40)
Albumin/Globulin Ratio: 2.1 (ref 1.2–2.2)
Albumin: 4.1 g/dL (ref 3.8–4.8)
Alkaline Phosphatase: 139 IU/L — ABNORMAL HIGH (ref 39–117)
BUN/Creatinine Ratio: 10 — ABNORMAL LOW (ref 12–28)
BUN: 13 mg/dL (ref 8–27)
Bilirubin Total: 0.7 mg/dL (ref 0.0–1.2)
CO2: 22 mmol/L (ref 20–29)
Calcium: 9.3 mg/dL (ref 8.7–10.3)
Chloride: 105 mmol/L (ref 96–106)
Creatinine, Ser: 1.26 mg/dL — ABNORMAL HIGH (ref 0.57–1.00)
GFR calc Af Amer: 50 mL/min/{1.73_m2} — ABNORMAL LOW (ref >59)
GFR calc non Af Amer: 43 mL/min/{1.73_m2} — ABNORMAL LOW (ref >59)
Globulin, Total: 2 g/dL (ref 1.5–4.5)
Glucose: 97 mg/dL (ref 65–99)
Potassium: 4.8 mmol/L (ref 3.5–5.2)
Sodium: 140 mmol/L (ref 134–144)
Total Protein: 6.1 g/dL (ref 6.0–8.5)

## 2019-07-02 LAB — LIPID PANEL
Chol/HDL Ratio: 2.9 ratio (ref 0.0–4.4)
Cholesterol, Total: 151 mg/dL (ref 100–199)
HDL: 52 mg/dL (ref >39)
LDL Chol Calc (NIH): 79 mg/dL (ref 0–99)
Triglycerides: 108 mg/dL (ref 0–149)
VLDL Cholesterol Cal: 20 mg/dL (ref 5–40)

## 2019-07-02 LAB — TSH: TSH: 2.31 u[IU]/mL (ref 0.450–4.500)

## 2019-07-02 LAB — PRO B NATRIURETIC PEPTIDE: NT-Pro BNP: 178 pg/mL (ref 0–301)

## 2019-07-02 NOTE — Telephone Encounter (Signed)
Recent labs done by our office, faxed to Dr. Silvestre Mesi office via epic fax function, as pt requested.

## 2019-07-02 NOTE — Telephone Encounter (Signed)
New Message:  Patient had labs drawn at Dr. Francesca Oman office yesterday (07/01/19). She would like our office to fax the results to Dr. Silvestre Mesi office at [phone].

## 2019-07-07 NOTE — Progress Notes (Signed)
 Cardiology Office Note    Date:  07/09/2019   ID:  Katherine Greer, DOB 12/16/1950, MRN [id number]  PCP:  Perini, Mark, MD  Cardiologist: Katarina Nelson, MD EPS: None  Chief Complaint  Patient presents with  . Follow-up    History of Present Illness:  Katherine Greer is a 68 y.o. female with long history of intractable headaches and extensive work-up at [facility], hypertension, HLD, CKD stage III family history of CAD with her mother having an MI in her 50s.  Worsening dyspnea on exertion, chronic diastolic CHF, echo 08/2018 LVEF 60 to 65% with grade 1 DD and increased filling pressures.  Coronary CTA 09/2018 minimal nonobstructive CAD.  Carvedilol increased to 6.25 mg twice daily and increase exercise recommended.  Renal artery stenosis status post stenting 04/30/2019.  Patient's BP has come down since renal artery stenting and spironolactone decreased and Lasix stopped. Complains of facial hair on lower dose spironolactone and swelling off Lasix. BP readings at home 125-135/70. Watching salt closely.  Blood pressure lower today but she thinks she may have taken to carvedilol this morning accidentally.  Past Medical History:  Diagnosis Date  . Abdominal bloating 05/03/2018  . Allergy   . Arthritis   . Asthma   . Cataract   . Colon polyp   . GERD (gastroesophageal reflux disease)   . Heart murmur   . Osteopenia   . Pill dysphagia 05/03/2018  . SOB (shortness of breath) 05/03/2018  . Thyroid disease   . UTI (urinary tract infection)     Past Surgical History:  Procedure Laterality Date  . FOOT SURGERY Bilateral   . PERIPHERAL VASCULAR INTERVENTION Right 04/30/2019   Procedure: PERIPHERAL VASCULAR INTERVENTION;  Surgeon: Clark, Christopher J, MD;  Location: [facility];  Service: Cardiovascular;  Laterality: Right;  Renal artery  . RENAL ANGIOGRAPHY Right 04/30/2019   Procedure: RENAL ANGIOGRAPHY;  Surgeon: Clark, Christopher J, MD;  Location: [facility];  Service: Cardiovascular;   Laterality: Right;    Current Medications: Current Meds  Medication Sig  . acetaminophen (TYLENOL) 500 MG tablet Take 1,000 mg by mouth every 6 (six) hours as needed for mild pain or moderate pain.   . atorvastatin (LIPITOR) 40 MG tablet Take 1 tablet (40 mg total) by mouth at bedtime.  . carvedilol (COREG) 6.25 MG tablet Take 1 tablet (6.25 mg total) by mouth 2 (two) times daily.  . Cholecalciferol 2000 units CAPS Take 2,000 Units by mouth at bedtime.   . cimetidine (TAGAMET) 200 MG tablet Take 200 mg by mouth daily as needed (acid reflux).  . clopidogrel (PLAVIX) 75 MG tablet Take 1 tablet (75 mg total) by mouth daily.  . fluticasone (FLONASE) 50 MCG/ACT nasal spray Place 1 spray into both nostrils daily as needed for allergies or rhinitis.  . gabapentin (NEURONTIN) 100 MG capsule TAKE 1 CAPSULE EVERY MORNING AND 2 CAPSULES IN THE EVENING (Patient taking differently: Take 100-200 mg by mouth See admin instructions. Take 100 mg in the morning and 200 mg at night)  . halobetasol (ULTRAVATE) 0.05 % cream Apply 1 application topically 2 (two) times daily as needed (dermatitis).   . levothyroxine (SYNTHROID, LEVOTHROID) 112 MCG tablet Take 112 mcg by mouth daily before breakfast.  . nicotine polacrilex (COMMIT) 4 MG lozenge Take 4 mg by mouth as needed for smoking cessation.  . spironolactone (ALDACTONE) 25 MG tablet Take 1 tablet (25 mg total) by mouth 2 (two) times daily.  . tolterodine (DETROL LA) 4 MG   24 hr capsule Take 4 mg by mouth daily.  Marland Kitchen triamcinolone cream (KENALOG) 0.1 % Apply 1 application topically 2 (two) times daily as needed (dermatitis).  . [DISCONTINUED] atorvastatin (LIPITOR) 40 MG tablet Take 40 mg by mouth at bedtime.   . [DISCONTINUED] carvedilol (COREG) 6.25 MG tablet Take 1 tablet (6.25 mg total) by mouth 2 (two) times daily.  . [DISCONTINUED] clopidogrel (PLAVIX) 75 MG tablet Take 1 tablet (75 mg total) by mouth daily.  . [DISCONTINUED] spironolactone (ALDACTONE) 25 MG  tablet Take 25 mg by mouth daily.     Allergies:   Augmentin [amoxicillin-pot clavulanate], Keflex [cephalexin], Levaquin [levofloxacin in d5w], Sulfa antibiotics, Contrast media [iodinated diagnostic agents], Depakote er [divalproex sodium er], Latex, Nickel, Nsaids, Tobramycin, and Topamax [topiramate]   Social History   Socioeconomic History  . Marital status: Married    Spouse name: Not on file  . Number of children: 2  . Years of education: Not on file  . Highest education level: Not on file  Occupational History  . 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 Financial resource strain: Not on file  . Food insecurity    Worry: Not on file    Inability: Not on file  . Transportation needs    Medical: Not on file    Non-medical: Not on file  Tobacco Use  . Smoking status: Former Research scientist (life sciences)  . Smokeless tobacco: Never Used  . Tobacco comment: Quit 2015  Substance and Sexual Activity  . Alcohol use: Yes    Frequency: Never    Comment: wine 4 times a week  . Drug use: Never  . Sexual activity: Yes  Lifestyle  . Physical activity    Days per week: Not on file    Minutes per session: Not on file  . Stress: Not on file  Relationships  . Social Herbalist on phone: Not on file    Gets together: Not on file    Attends religious service: Not on file    Active member of club or organization: Not on file    Attends meetings of clubs or organizations: Not on file    Relationship status: Not on file  Other Topics Concern  . Not on file  Social History Narrative  . Not on file     Family History:  The patient's family history includes Breast cancer in her sister; Cancer in her son; Chronic Renal Failure in her father; Diabetes in her brother and father; Heart attack in her mother; Lung cancer in her father.   ROS:   Please see the history of present illness.    ROS All other systems reviewed and are negative.   PHYSICAL EXAM:   VS:  BP  106/66   Pulse 70   Ht 5' 7" (1.702 m)   Wt 203 lb 3.2 oz (92.2 kg)   SpO2 97%   BMI 31.83 kg/m   Physical Exam  GEN: Well nourished, well developed, in no acute distress  Neck: no JVD, carotid bruits, or masses Cardiac:RRR; no murmurs, rubs, or gallops  Respiratory:  clear to auscultation bilaterally, normal work of breathing GI: soft, nontender, nondistended, + BS Ext: without cyanosis, clubbing, or edema, Good distal pulses bilaterally Neuro:  Alert and Oriented x 3 Psych: euthymic mood, full affect  Wt Readings from Last 3 Encounters:  07/09/19 203 lb 3.2 oz (92.2 kg)  06/03/19 198 lb (89.8 kg)  05/06/19 198 lb (89.8  kg)      Studies/Labs Reviewed:   EKG:  EKG is  ordered today.  The ekg ordered today demonstrates normal sinus rhythm at 70bpm normal EKG Recent Labs: 07/01/2019: ALT 44; BUN 13; Creatinine, Ser 1.26; Hemoglobin 12.9; NT-Pro BNP 178; Platelets 295; Potassium 4.8; Sodium 140; TSH 2.310   Lipid Panel    Component Value Date/Time   CHOL 151 07/01/2019 1053   TRIG 108 07/01/2019 1053   HDL 52 07/01/2019 1053   CHOLHDL 2.9 07/01/2019 1053   LDLCALC 79 07/01/2019 1053    Additional studies/ records that were reviewed today include:  2D echo 1/2020TTE: 08/2018   Left ventricle: The cavity size was normal. There was mild   concentric hypertrophy. Systolic function was normal. The   estimated ejection fraction was in the range of 60% to 65%. Wall   motion was normal; there were no regional wall motion   abnormalities. Doppler parameters are consistent with abnormal   left ventricular relaxation (grade 1 diastolic dysfunction).   Doppler parameters are consistent with elevated ventricular   end-diastolic filling pressure. - Aortic valve: There was mild regurgitation. - Right ventricle: The cavity size was normal. Wall thickness was   normal. Systolic function was normal. - Right atrium: The atrium was normal in size. - Tricuspid valve: There was trivial  regurgitation. - Pulmonary arteries: Systolic pressure was within the normal   range. - Inferior vena cava: The vessel was normal in size. - Pericardium, extracardiac: There was no pericardial effusion.   Coronary CTA: 09/2018   1. Coronary calcium score of 46. This was 34 percentile for age and sex matched control.   2. Normal coronary origin with right dominance.   3. Minimal diffuse CAD.  Risk factor modification is recommended.       ASSESSMENT:    1. Chronic diastolic CHF (congestive heart failure) (HCC)   2. Essential hypertension   3. Coronary artery disease involving coronary bypass graft of native heart with unstable angina pectoris (HCC)   4. Hyperlipidemia, unspecified hyperlipidemia type   5. Renal artery stenosis (HCC)   6. DOE (dyspnea on exertion)   7. Precordial pain      PLAN:  In order of problems listed above:  Chronic diastolic CHF normal LVEF with grade 1 DD on echo 08/2018-complains of rings being tight and feeling puffy since Lasix stopped and spironolactone decreased.  Will increase spironolactone to 25 mg twice daily.  She can take Lasix 20 mg as needed used to be on 40 mg daily.  Follow-up be met and BNP in 2 weeks and follow-up with me in 2 to 3 weeks for blood pressure and swelling  Essential hypertension blood pressure low today but she thinks she took to carvedilol this morning  Nonobstructive CAD on coronary CTA 09/2018  Hyperlipidemia on atorvastatin LDL 79-07/01/19  CKD stage III creatinine 1.26 07/01/2019 recheck renal in 2 weeks  Renal artery stenosis status post stenting 04/30/2019   Medication Adjustments/Labs and Tests Ordered: Current medicines are reviewed at length with the patient today.  Concerns regarding medicines are outlined above.  Medication changes, Labs and Tests ordered today are listed in the Patient Instructions below. Patient Instructions  Medication Instructions:  Your physician has recommended you make the following  change in your medication:   1. INCREASE: spironolactone to 25 mg twice a day  2. TAKE: furosemide (lasix) 20 mg AS NEEDED for swelling or weight gain  If you need a refill on your cardiac medications before   your next appointment, please call your pharmacy.   Lab work: None Ordered  If you have labs (blood work) drawn today and your tests are completely normal, you will receive your results only by: . MyChart Message (if you have MyChart) OR . A paper copy in the mail If you have any lab test that is abnormal or we need to change your treatment, we will call you to review the results.  Testing/Procedures: None ordered  Follow-Up: Follow up with  , PA via VIRTUAL Visit on   Any Other Special Instructions Will Be Listed Below (If Applicable).       Signed,  , PA-C  07/09/2019 1:01 PM    [facility] [address], [place], [place]  [address] Phone: [phone]; Fax: [phone]    

## 2019-07-09 ENCOUNTER — Encounter: Payer: Self-pay | Admitting: Physician Assistant

## 2019-07-09 ENCOUNTER — Other Ambulatory Visit: Payer: Self-pay

## 2019-07-09 ENCOUNTER — Ambulatory Visit (INDEPENDENT_AMBULATORY_CARE_PROVIDER_SITE_OTHER): Payer: Medicare Other | Admitting: Physician Assistant

## 2019-07-09 VITALS — BP 106/66 | HR 70 | Ht 67.0 in | Wt 203.2 lb

## 2019-07-09 DIAGNOSIS — I25700 Atherosclerosis of coronary artery bypass graft(s), unspecified, with unstable angina pectoris: Secondary | ICD-10-CM | POA: Diagnosis not present

## 2019-07-09 DIAGNOSIS — I701 Atherosclerosis of renal artery: Secondary | ICD-10-CM | POA: Diagnosis not present

## 2019-07-09 DIAGNOSIS — R072 Precordial pain: Secondary | ICD-10-CM

## 2019-07-09 DIAGNOSIS — R0600 Dyspnea, unspecified: Secondary | ICD-10-CM | POA: Diagnosis not present

## 2019-07-09 DIAGNOSIS — I10 Essential (primary) hypertension: Secondary | ICD-10-CM | POA: Diagnosis not present

## 2019-07-09 DIAGNOSIS — E785 Hyperlipidemia, unspecified: Secondary | ICD-10-CM | POA: Diagnosis not present

## 2019-07-09 DIAGNOSIS — I5032 Chronic diastolic (congestive) heart failure: Secondary | ICD-10-CM | POA: Diagnosis not present

## 2019-07-09 DIAGNOSIS — R0609 Other forms of dyspnea: Secondary | ICD-10-CM

## 2019-07-09 MED ORDER — CLOPIDOGREL BISULFATE 75 MG PO TABS: 75.0000 mg | ORAL_TABLET | Freq: Every day | ORAL | 3 refills | Status: DC

## 2019-07-09 MED ORDER — ATORVASTATIN CALCIUM 40 MG PO TABS: 40.0000 mg | ORAL_TABLET | Freq: Every day | ORAL | 3 refills | Status: DC

## 2019-07-09 MED ORDER — FUROSEMIDE 20 MG PO TABS: 20.0000 mg | ORAL_TABLET | Freq: Every day | ORAL | 0 refills | Status: DC | PRN

## 2019-07-09 MED ORDER — CARVEDILOL 6.25 MG PO TABS: 6.2500 mg | ORAL_TABLET | Freq: Two times a day (BID) | ORAL | 3 refills | Status: DC

## 2019-07-09 MED ORDER — SPIRONOLACTONE 25 MG PO TABS: 25.0000 mg | ORAL_TABLET | Freq: Two times a day (BID) | ORAL | 3 refills | Status: DC

## 2019-07-09 NOTE — Patient Instructions (Addendum)
Medication Instructions:  Your physician has recommended you make the following change in your medication:   1. INCREASE: spironolactone to 25 mg twice a day  2. TAKE: furosemide (lasix) 20 mg AS NEEDED for swelling or weight gain  If you need a refill on your cardiac medications before your next appointment, please call your pharmacy.   Lab work: None Ordered  If you have labs (blood work) drawn today and your tests are completely normal, you will receive your results only by: Marland Kitchen MyChart Message (if you have MyChart) OR . A paper copy in the mail If you have any lab test that is abnormal or we need to change your treatment, we will call you to review the results.  Testing/Procedures: None ordered  Follow-Up: Follow up with Ermalinda Barrios, PA via VIRTUAL Visit on 07/30/19 at 12:30 PM  Any Other Special Instructions Will Be Listed Below (If Applicable).

## 2019-07-17 ENCOUNTER — Other Ambulatory Visit: Payer: Self-pay

## 2019-07-17 ENCOUNTER — Ambulatory Visit
Admission: RE | Admit: 2019-07-17 | Discharge: 2019-07-17 | Disposition: A | Discharge status: Home / Self Care | Payer: Medicare Other | Source: Ambulatory Visit | Attending: Orthopedic Surgery | Admitting: Orthopedic Surgery

## 2019-07-17 DIAGNOSIS — M25511 Pain in right shoulder: Secondary | ICD-10-CM

## 2019-07-18 DIAGNOSIS — M75111 Incomplete rotator cuff tear or rupture of right shoulder, not specified as traumatic: Secondary | ICD-10-CM | POA: Diagnosis not present

## 2019-07-29 NOTE — Progress Notes (Signed)
Virtual Visit via Telephone Note   This visit type was conducted due to national recommendations for restrictions regarding the COVID-19 Pandemic (e.g. social distancing) in an effort to limit this patient's exposure and mitigate transmission in our community.  Due to her co-morbid illnesses, this patient is at least at moderate risk for complications without adequate follow up.  This format is felt to be most appropriate for this patient at this time.  The patient did not have access to video technology/had technical difficulties with video requiring transitioning to audio format only (telephone).  All issues noted in this document were discussed and addressed.  No physical exam could be performed with this format.  Please refer to the patient's chart for her  consent to telehealth for [facility].   Date:  07/30/2019   ID:  Katherine Greer, DOB 07-04-1951, MRN [id number]  Patient Location: Home Provider Location: Home  PCP:  Crist Infante, MD  Cardiologist:  Ena Dawley, MD   Electrophysiologist:  None   Evaluation Performed:  Follow-Up Visit  Chief Complaint: f/u  History of Present Illness:    Katherine Greer is a 68 y.o. female with with long history of intractable headaches and extensive work-up at [facility], hypertension, HLD, CKD stage III family history of CAD with her mother having an MI in her 75s.  Worsening dyspnea on exertion, chronic diastolic CHF, echo 99991111 LVEF 60 to 65% with grade 1 DD and increased filling pressures.  Coronary CTA 09/2018 minimal nonobstructive CAD.  Carvedilol increased to 6.25 mg twice daily and increase exercise recommended.   Renal artery stenosis status post stenting 04/30/2019.  I saw the patient 07/09/2019 and blood pressure had come down since renal artery stenting so Lasix was stopped and spironolactone were decreased.  Patient was complaining of rings being tight since Lasix was stopped.  She also complained of facial hair on lower dose spironolactone.   I increased her spironolactone to 25 mg twice daily and told her she could take Lasix 20 mg as needed.   BP running 113-136/54-68 P 70-80 range. Hasn't had to take any lasix.  Overall feeling better on current medications.  Has not had her kidneys checked since last month.  The patient does not have symptoms concerning for COVID-19 infection (fever, chills, cough, or new shortness of breath).    Past Medical History:  Diagnosis Date   Abdominal bloating 05/03/2018   Allergy    Arthritis    Asthma    Cataract    Colon polyp    GERD (gastroesophageal reflux disease)    Heart murmur    Osteopenia    Pill dysphagia 05/03/2018   SOB (shortness of breath) 05/03/2018   Thyroid disease    UTI (urinary tract infection)    Past Surgical History:  Procedure Laterality Date   FOOT SURGERY Bilateral    PERIPHERAL VASCULAR INTERVENTION Right 04/30/2019   Procedure: PERIPHERAL VASCULAR INTERVENTION;  Surgeon: Marty Heck, MD;  Location: [place] [facility];  Service: Cardiovascular;  Laterality: Right;  Renal artery   RENAL ANGIOGRAPHY Right 04/30/2019   Procedure: RENAL ANGIOGRAPHY;  Surgeon: Marty Heck, MD;  Location: [place] [facility];  Service: Cardiovascular;  Laterality: Right;     Current Meds  Medication Sig   acetaminophen (TYLENOL) 500 MG tablet Take 1,000 mg by mouth every 6 (six) hours as needed for mild pain or moderate pain.    atorvastatin (LIPITOR) 40 MG tablet Take 1 tablet (40 mg total) by mouth at  bedtime.   carvedilol (COREG) 6.25 MG tablet Take 1 tablet (6.25 mg total) by mouth 2 (two) times daily.   Cholecalciferol 2000 units CAPS Take 2,000 Units by mouth at bedtime.    cimetidine (TAGAMET) 200 MG tablet Take 200 mg by mouth daily as needed (acid reflux).   clopidogrel (PLAVIX) 75 MG tablet Take 1 tablet (75 mg total) by mouth daily.   fluticasone (FLONASE) 50 MCG/ACT nasal spray Place 1 spray into both nostrils daily as needed for  allergies or rhinitis.   furosemide (LASIX) 20 MG tablet Take 1 tablet (20 mg total) by mouth daily as needed (swelling or weight gain).   gabapentin (NEURONTIN) 100 MG capsule TAKE 1 CAPSULE EVERY MORNING AND 2 CAPSULES IN THE EVENING (Patient taking differently: Take 100-200 mg by mouth See admin instructions. Take 100 mg in the morning and 200 mg at night)   halobetasol (ULTRAVATE) 0.05 % cream Apply 1 application topically 2 (two) times daily as needed (dermatitis).    levothyroxine (SYNTHROID, LEVOTHROID) 112 MCG tablet Take 112 mcg by mouth daily before breakfast.   nicotine polacrilex (COMMIT) 4 MG lozenge Take 4 mg by mouth as needed for smoking cessation.   spironolactone (ALDACTONE) 25 MG tablet Take 1 tablet (25 mg total) by mouth 2 (two) times daily.   tolterodine (DETROL LA) 4 MG 24 hr capsule Take 4 mg by mouth daily.   triamcinolone cream (KENALOG) 0.1 % Apply 1 application topically 2 (two) times daily as needed (dermatitis).     Allergies:   Augmentin [amoxicillin-pot clavulanate], Keflex [cephalexin], Levaquin [levofloxacin in d5w], Sulfa antibiotics, Contrast media [iodinated diagnostic agents], Depakote er [divalproex sodium er], Latex, Nickel, Nsaids, Tobramycin, and Topamax [topiramate]   Social History   Tobacco Use   Smoking status: Former Smoker   Smokeless tobacco: Never Used   Tobacco comment: Quit 2015  Substance Use Topics   Alcohol use: Yes    Frequency: Never    Comment: wine 4 times a week   Drug use: Never     Family Hx: The patient's family history includes Breast cancer in her sister; Cancer in her son; Chronic Renal Failure in her father; Diabetes in her brother and father; Heart attack in her mother; Lung cancer in her father. There is no history of Colon cancer, Esophageal cancer, Inflammatory bowel disease, Liver disease, Pancreatic cancer, Stomach cancer, or Rectal cancer.  ROS:   Please see the history of present illness.      All  other systems reviewed and are negative.   Prior CV studies:   The following studies were reviewed today:  2D echo 1/2020TTE: 08/2018   Left ventricle: The cavity size was normal. There was mild   concentric hypertrophy. Systolic function was normal. The   estimated ejection fraction was in the range of 60% to 65%. Wall   motion was normal; there were no regional wall motion   abnormalities. Doppler parameters are consistent with abnormal   left ventricular relaxation (grade 1 diastolic dysfunction).   Doppler parameters are consistent with elevated ventricular   end-diastolic filling pressure. - Aortic valve: There was mild regurgitation. - Right ventricle: The cavity size was normal. Wall thickness was   normal. Systolic function was normal. - Right atrium: The atrium was normal in size. - Tricuspid valve: There was trivial regurgitation. - Pulmonary arteries: Systolic pressure was within the normal   range. - Inferior vena cava: The vessel was normal in size. - Pericardium, extracardiac: There was no pericardial effusion.  Coronary CTA: 09/2018   1. Coronary calcium score of 46. This was 83 percentile for age and sex matched control.   2. Normal coronary origin with right dominance.   3. Minimal diffuse CAD.  Risk factor modification is recommended.           Labs/Other Tests and Data Reviewed:    EKG:  No ECG reviewed.  Recent Labs: 07/01/2019: ALT 44; BUN 13; Creatinine, Ser 1.26; Hemoglobin 12.9; NT-Pro BNP 178; Platelets 295; Potassium 4.8; Sodium 140; TSH 2.310   Recent Lipid Panel Lab Results  Component Value Date/Time   CHOL 151 07/01/2019 10:53 AM   TRIG 108 07/01/2019 10:53 AM   HDL 52 07/01/2019 10:53 AM   CHOLHDL 2.9 07/01/2019 10:53 AM   LDLCALC 79 07/01/2019 10:53 AM    Wt Readings from Last 3 Encounters:  07/30/19 195 lb (88.5 kg)  07/09/19 203 lb 3.2 oz (92.2 kg)  06/03/19 198 lb (89.8 kg)     Objective:    Vital Signs:  BP (!) 125/54     Pulse 75    Ht 5\' 7"  (1.702 m)    Wt 195 lb (88.5 kg)    BMI 30.54 kg/m      ASSESSMENT & PLAN:    Chronic diastolic CHF normal LVEF with grade 1 DD on echo 08/2018-last office visit patient complained of rings being tight and feeling puffy since Lasix stopped and spironolactone decreased.    I increased spironolactone to 25 mg twice daily.  She can take Lasix 20 mg as needed used to be on 40 mg daily.  Patient has not needed any Lasix.  Overall doing well.     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   The importance of social distancing was discussed today.  Time:   Today, I have spent 5 minutes with the patient with telehealth technology discussing the above problems.     Medication Adjustments/Labs and Tests Ordered: Current medicines are reviewed at length with the patient today.  Concerns regarding medicines are outlined above.   Tests Ordered: No orders of the defined types were placed in this encounter.   Medication Changes: No orders of the defined types were placed in this encounter.   Follow Up:  Either In Person or Virtual in 3 month(s) Dr. Meda Coffee or Ermalinda Barrios, PA-C  Signed, Ermalinda Barrios, PA-C  07/30/2019 9:51 AM    [place]

## 2019-07-30 ENCOUNTER — Telehealth (INDEPENDENT_AMBULATORY_CARE_PROVIDER_SITE_OTHER): Payer: Medicare Other | Admitting: Physician Assistant

## 2019-07-30 ENCOUNTER — Encounter: Payer: Self-pay | Admitting: Physician Assistant

## 2019-07-30 VITALS — BP 125/54 | HR 75 | Ht 67.0 in | Wt 195.0 lb

## 2019-07-30 DIAGNOSIS — E782 Mixed hyperlipidemia: Secondary | ICD-10-CM

## 2019-07-30 DIAGNOSIS — I701 Atherosclerosis of renal artery: Secondary | ICD-10-CM

## 2019-07-30 DIAGNOSIS — I5032 Chronic diastolic (congestive) heart failure: Secondary | ICD-10-CM

## 2019-07-30 DIAGNOSIS — I10 Essential (primary) hypertension: Secondary | ICD-10-CM

## 2019-07-30 DIAGNOSIS — I2510 Atherosclerotic heart disease of native coronary artery without angina pectoris: Secondary | ICD-10-CM | POA: Diagnosis not present

## 2019-07-30 DIAGNOSIS — N183 Chronic kidney disease, stage 3 unspecified: Secondary | ICD-10-CM

## 2019-07-30 NOTE — Patient Instructions (Signed)
Medication Instructions:  Your physician recommends that you continue on your current medications as directed. Please refer to the Current Medication list given to you today.  *If you need a refill on your cardiac medications before your next appointment, please call your pharmacy*  Lab Work: BMET this week or next   If you have labs (blood work) drawn today and your tests are completely normal, you will receive your results only by: Marland Kitchen MyChart Message (if you have MyChart) OR . A paper copy in the mail If you have any lab test that is abnormal or we need to change your treatment, we will call you to review the results.  Testing/Procedures: None today   Follow-Up: At [facility], you and your health needs are our priority.  As part of our continuing mission to provide you with exceptional heart care, we have created designated Provider Care Teams.  These Care Teams include your primary Cardiologist (physician) and Advanced Practice Providers (APPs -  Physician Assistants and Nurse Practitioners) who all work together to provide you with the care you need, when you need it.  Your next appointment:   4 month(s)  The format for your next appointment:   Either In Person or Virtual with Dr.Nelson  Provider:   Ena Dawley, MD  Other Instructions None      Thank you for choosing [facility] !

## 2019-08-04 DIAGNOSIS — L821 Other seborrheic keratosis: Secondary | ICD-10-CM | POA: Diagnosis not present

## 2019-08-04 DIAGNOSIS — L309 Dermatitis, unspecified: Secondary | ICD-10-CM | POA: Diagnosis not present

## 2019-08-04 DIAGNOSIS — L659 Nonscarring hair loss, unspecified: Secondary | ICD-10-CM | POA: Diagnosis not present

## 2019-08-04 DIAGNOSIS — L570 Actinic keratosis: Secondary | ICD-10-CM | POA: Diagnosis not present

## 2019-08-17 ENCOUNTER — Other Ambulatory Visit: Payer: Self-pay | Admitting: Cardiology

## 2019-08-17 DIAGNOSIS — R0609 Other forms of dyspnea: Secondary | ICD-10-CM

## 2019-08-17 DIAGNOSIS — R7989 Other specified abnormal findings of blood chemistry: Secondary | ICD-10-CM

## 2019-08-17 DIAGNOSIS — E875 Hyperkalemia: Secondary | ICD-10-CM

## 2019-08-17 DIAGNOSIS — R0602 Shortness of breath: Secondary | ICD-10-CM

## 2019-08-28 ENCOUNTER — Encounter

## 2019-09-01 DIAGNOSIS — N958 Other specified menopausal and perimenopausal disorders: Secondary | ICD-10-CM | POA: Diagnosis not present

## 2019-09-01 DIAGNOSIS — Z1231 Encounter for screening mammogram for malignant neoplasm of breast: Secondary | ICD-10-CM | POA: Diagnosis not present

## 2019-09-01 DIAGNOSIS — M816 Localized osteoporosis [Lequesne]: Secondary | ICD-10-CM | POA: Diagnosis not present

## 2019-09-10 DIAGNOSIS — M810 Age-related osteoporosis without current pathological fracture: Secondary | ICD-10-CM | POA: Diagnosis not present

## 2019-10-11 DIAGNOSIS — Z23 Encounter for immunization: Secondary | ICD-10-CM | POA: Diagnosis not present

## 2019-10-13 DIAGNOSIS — Z79899 Other long term (current) drug therapy: Secondary | ICD-10-CM | POA: Diagnosis not present

## 2019-10-13 DIAGNOSIS — L639 Alopecia areata, unspecified: Secondary | ICD-10-CM | POA: Diagnosis not present

## 2019-10-13 DIAGNOSIS — L650 Telogen effluvium: Secondary | ICD-10-CM | POA: Diagnosis not present

## 2019-10-13 DIAGNOSIS — L570 Actinic keratosis: Secondary | ICD-10-CM | POA: Diagnosis not present

## 2019-10-14 ENCOUNTER — Telehealth: Payer: Self-pay | Admitting: Cardiology

## 2019-10-14 NOTE — Telephone Encounter (Signed)
Spoke with the pt and informed her that per Dr. Meda Coffee and our Pharmacist, they recommend that she hold her Carvedilol for one month, continue to monitor her HR/BP and report any abnormal findings to the office, for we may need to switch her to a different agent.  Pt verbalized understanding and agrees with this plan. Pt states she will call us in a month to report her findings, and she will monitor her BP/HR, and report as needed.  Pt verbalized understanding and agrees with this plan.

## 2019-10-14 NOTE — Telephone Encounter (Signed)
I would try to avoid beta blockers all together. Ive found reports of alopecia with other beta blocker as well.   I agree with having patient hold (could consider a 1 week taper, but not sure its needed at this low dose) Patient should keep eye on BP and HR for need for a different agent.

## 2019-10-14 NOTE — Telephone Encounter (Signed)
Pt is calling in to let Dr. Meda Coffee know that she saw Dr. Ronnald Ramp at [facility] yesterday, for noted hair loss.  Pt states she is losing her hair significantly, to the point she has to sweep her floor up after brushing it. Pt states Dr. Ronnald Ramp informed her that the likely cause of her hair loss is probably coming from taking her carvedilol, given the timing she noted her hair falling out.  Pt states that she started taking carvedilol and within 2 months later, her hair started falling out and progressed thereafter.  Pt states that Dr. Ronnald Ramp said that the timing of when she started the med and when it started making her hair fall out, coincides with each other. Pt states that Dr. Ronnald Ramp also did a TSH level and a Lupus panel on her yesterday, and those results are still pending.  Pt does have thyroid issues and is on Synthroid for this.  Pt does have a virtual visit with Dr. Meda Coffee on this Thursday 10/16/19, but she would like to have this issue advised on beforehand, for she is very anxious about losing more hair.  Pt states she needs to know if she should stop taking carvedilol, and if so, what regimen should be called in its place? Informed the pt that Dr. Meda Coffee is out of the office today, but I will still route this message to her for further review and recommendation.  Also informed the pt that I will route this call to our Pharmacist, for further review and recommendation as well. Informed the pt that I will follow-up with her shortly thereafter, once further advise is given.  Pt verbalized understanding and agrees with this plan.

## 2019-10-14 NOTE — Telephone Encounter (Signed)
Please hold for a month and see if that makes a difference

## 2019-10-14 NOTE — Telephone Encounter (Signed)
New Message   Patient is calling because she saw her dermatologist yesterday due to her hair falling out. She states that the dermatologist believes it is due to her beta blocker. She would like to know what she can do now instead of waiting to discuss on her appt Thursday. Please call to discuss. [phone] is an alternate contact number

## 2019-10-14 NOTE — Addendum Note (Signed)
Addended by: Nuala Alpha on: 10/14/2019 01:41 PM   Modules accepted: Orders

## 2019-10-14 NOTE — Telephone Encounter (Signed)
Megan,  If someone has presumed side effect for beta-blocker - in this case hair loss, would switching to a different betablocker help? Or would you avoid BB all together?  Thank you!  [place] Siren

## 2019-10-15 ENCOUNTER — Telehealth: Payer: Medicare Other | Admitting: Cardiology

## 2019-10-16 ENCOUNTER — Other Ambulatory Visit: Payer: Self-pay

## 2019-10-16 ENCOUNTER — Telehealth (INDEPENDENT_AMBULATORY_CARE_PROVIDER_SITE_OTHER): Payer: Medicare Other | Admitting: Cardiology

## 2019-10-16 VITALS — BP 118/57 | HR 73 | Ht 67.0 in | Wt 195.0 lb

## 2019-10-16 DIAGNOSIS — I25700 Atherosclerosis of coronary artery bypass graft(s), unspecified, with unstable angina pectoris: Secondary | ICD-10-CM

## 2019-10-16 DIAGNOSIS — I10 Essential (primary) hypertension: Secondary | ICD-10-CM | POA: Diagnosis not present

## 2019-10-16 DIAGNOSIS — I2510 Atherosclerotic heart disease of native coronary artery without angina pectoris: Secondary | ICD-10-CM

## 2019-10-16 DIAGNOSIS — Z79899 Other long term (current) drug therapy: Secondary | ICD-10-CM

## 2019-10-16 DIAGNOSIS — E785 Hyperlipidemia, unspecified: Secondary | ICD-10-CM

## 2019-10-16 DIAGNOSIS — L659 Nonscarring hair loss, unspecified: Secondary | ICD-10-CM

## 2019-10-16 DIAGNOSIS — E782 Mixed hyperlipidemia: Secondary | ICD-10-CM

## 2019-10-16 NOTE — Progress Notes (Signed)
Virtual Visit via Telephone Note   This visit type was conducted due to national recommendations for restrictions regarding the COVID-19 Pandemic (e.g. social distancing) in an effort to limit this patient's exposure and mitigate transmission in our community.  Due to her co-morbid illnesses, this patient is at least at moderate risk for complications without adequate follow up.  This format is felt to be most appropriate for this patient at this time.  The patient did not have access to video technology/had technical difficulties with video requiring transitioning to audio format only (telephone).  All issues noted in this document were discussed and addressed.  No physical exam could be performed with this format.  Please refer to the patient's chart for her  consent to telehealth for [facility].   Date:  10/16/2019   ID:  Katherine Greer, DOB 1951/03/13, MRN [id number]  Patient Location: Home Provider Location: Home  PCP:  Katherine Infante, MD  Cardiologist:  Katherine Dawley, MD   Electrophysiologist:  None   Evaluation Performed:  Follow-Up Visit  History of Present Illness:    Katherine Greer is a 69 y.o. female with with long history of intractable headaches and extensive work-up at [facility], hypertension, HLD, CKD stage III family history of CAD with her mother having an MI in her 44s.  Worsening dyspnea on exertion, chronic diastolic CHF, echo 99991111 LVEF 60 to 65% with grade 1 DD and increased filling pressures.  Coronary CTA 09/2018 minimal nonobstructive CAD.  Carvedilol increased to 6.25 mg twice daily and increase exercise recommended.   Renal artery stenosis status post stenting 04/30/2019.  I saw the patient 07/09/2019 and blood pressure had come down since renal artery stenting so Lasix was stopped and spironolactone were decreased.  Patient was complaining of rings being tight since Lasix was stopped.  She also complained of facial hair on lower dose spironolactone.  I increased her  spironolactone to 25 mg twice daily and told her she could take Lasix 20 mg as needed.  10/16/2019 - the patient is feeling well, she continues to take spironolactone 12.5 mg po BID and lasix just as needed, this has only happened once since the last visit. Blood pressure has been controlled. She walks a mile 3x/week with no symptoms. She has been experiencing significant hair loss and her dermatologist suggested that BB might be responsible, as a result carvedilol has been discontinued.  The patient does not have symptoms concerning for COVID-19 infection (fever, chills, cough, or new shortness of breath).   Past Medical History:  Diagnosis Date  . Abdominal bloating 05/03/2018  . Allergy   . Arthritis   . Asthma   . Cataract   . Colon polyp   . GERD (gastroesophageal reflux disease)   . Heart murmur   . Osteopenia   . Pill dysphagia 05/03/2018  . SOB (shortness of breath) 05/03/2018  . Thyroid disease   . UTI (urinary tract infection)    Past Surgical History:  Procedure Laterality Date  . FOOT SURGERY Bilateral   . PERIPHERAL VASCULAR INTERVENTION Right 04/30/2019   Procedure: PERIPHERAL VASCULAR INTERVENTION;  Surgeon: Katherine Heck, MD;  Location: [place] [facility];  Service: Cardiovascular;  Laterality: Right;  Renal artery  . RENAL ANGIOGRAPHY Right 04/30/2019   Procedure: RENAL ANGIOGRAPHY;  Surgeon: Katherine Heck, MD;  Location: [place] [facility];  Service: Cardiovascular;  Laterality: Right;     Current Meds  Medication Sig  . acetaminophen (TYLENOL) 500 MG tablet Take 1,000 mg  by mouth every 6 (six) hours as needed for mild pain or moderate pain.   Marland Kitchen atorvastatin (LIPITOR) 40 MG tablet Take 1 tablet (40 mg total) by mouth at bedtime.  . Cholecalciferol 2000 units CAPS Take 2,000 Units by mouth at bedtime.   . cimetidine (TAGAMET) 200 MG tablet Take 200 mg by mouth daily as needed (acid reflux).  . clopidogrel (PLAVIX) 75 MG tablet Take 1 tablet (75 mg total) by  mouth daily.  . fluticasone (FLONASE) 50 MCG/ACT nasal spray Place 1 spray into both nostrils daily as needed for allergies or rhinitis.  . furosemide (LASIX) 40 MG tablet Take 0.5 tablets (20 mg total) by mouth daily as needed (Swelling).  . gabapentin (NEURONTIN) 100 MG capsule TAKE 1 CAPSULE EVERY MORNING AND 2 CAPSULES IN THE EVENING (Patient taking differently: Take 100-200 mg by mouth See admin instructions. Take 100 mg in the morning and 200 mg at night)  . halobetasol (ULTRAVATE) 0.05 % cream Apply 1 application topically 2 (two) times daily as needed (dermatitis).   Marland Kitchen levothyroxine (SYNTHROID, LEVOTHROID) 112 MCG tablet Take 112 mcg by mouth daily before breakfast.  . nicotine polacrilex (COMMIT) 4 MG lozenge Take 4 mg by mouth as needed for smoking cessation.  Marland Kitchen spironolactone (ALDACTONE) 25 MG tablet Take 1 tablet (25 mg total) by mouth 2 (two) times daily.  Marland Kitchen tolterodine (DETROL LA) 4 MG 24 hr capsule Take 4 mg by mouth daily.  Marland Kitchen triamcinolone cream (KENALOG) 0.1 % Apply 1 application topically 2 (two) times daily as needed (dermatitis).    Allergies:   Augmentin [amoxicillin-pot clavulanate], Keflex [cephalexin], Levaquin [levofloxacin in d5w], Sulfa antibiotics, Cephalosporins, Contrast media [iodinated diagnostic agents], Depakote er [divalproex sodium er], Latex, Nickel, Nsaids, Tobramycin, and Topamax [topiramate]   Social History   Tobacco Use  . Smoking status: Former Research scientist (life sciences)  . Smokeless tobacco: Never Used  . Tobacco comment: Quit 2015  Substance Use Topics  . Alcohol use: Yes    Comment: wine 4 times a week  . Drug use: Never    Family Hx: The patient's family history includes Breast cancer in her sister; Cancer in her son; Chronic Renal Failure in her father; Diabetes in her brother and father; Heart attack in her mother; Lung cancer in her father. There is no history of Colon cancer, Esophageal cancer, Inflammatory bowel disease, Liver disease, Pancreatic cancer,  Stomach cancer, or Rectal cancer.  ROS:   Please see the history of present illness.      All other systems reviewed and are negative.  Prior CV studies:    2D echo 1/2020TTE: 08/2018   Left ventricle: The cavity size was normal. There was mild   concentric hypertrophy. Systolic function was normal. The   estimated ejection fraction was in the range of 60% to 65%. Wall   motion was normal; there were no regional wall motion   abnormalities. Doppler parameters are consistent with abnormal   left ventricular relaxation (grade 1 diastolic dysfunction).   Doppler parameters are consistent with elevated ventricular   end-diastolic filling pressure. - Aortic valve: There was mild regurgitation. - Right ventricle: The cavity size was normal. Wall thickness was   normal. Systolic function was normal. - Right atrium: The atrium was normal in size. - Tricuspid valve: There was trivial regurgitation. - Pulmonary arteries: Systolic pressure was within the normal   range. - Inferior vena cava: The vessel was normal in size. - Pericardium, extracardiac: There was no pericardial effusion.   Coronary CTA:  09/2018   1. Coronary calcium score of 46. This was 39 percentile for age and sex matched control. 2. Normal coronary origin with right dominance. 3. Minimal diffuse CAD.  Risk factor modification is recommended.   Labs/Other Tests and Data Reviewed:    EKG:  No ECG reviewed.  Recent Labs: 07/01/2019: ALT 44; BUN 13; Creatinine, Ser 1.26; Hemoglobin 12.9; NT-Pro BNP 178; Platelets 295; Potassium 4.8; Sodium 140; TSH 2.310   Recent Lipid Panel Lab Results  Component Value Date/Time   CHOL 151 07/01/2019 10:53 AM   TRIG 108 07/01/2019 10:53 AM   HDL 52 07/01/2019 10:53 AM   CHOLHDL 2.9 07/01/2019 10:53 AM   LDLCALC 79 07/01/2019 10:53 AM   Wt Readings from Last 3 Encounters:  10/16/19 195 lb (88.5 kg)  07/30/19 195 lb (88.5 kg)  07/09/19 203 lb 3.2 oz (92.2 kg)    Objective:     Vital Signs:  BP (!) 118/57   Pulse 73   Ht 5\' 7"  (1.702 m)   Wt 195 lb (88.5 kg)   BMI 30.54 kg/m     ASSESSMENT & PLAN:    Chronic diastolic CHF normal LVEF with grade 1 DD on echo 08/2018-l - she is euvolemic on current regimen   Essential hypertension blood pressure controlled, we will follow after discontinuation of carvedilol, because of hair loss, she should not be started on another BB in the future  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   The importance of social distancing was discussed today.  Time:   Today, I have spent 5 minutes with the patient with telehealth technology discussing the above problems.     Medication Adjustments/Labs and Tests Ordered: Current medicines are reviewed at length with the patient today.  Concerns regarding medicines are outlined above.   Tests Ordered: No orders of the defined types were placed in this encounter.  Medication Changes: No orders of the defined types were placed in this encounter.  Follow Up: 4 months in person  Signed, Katherine Dawley, MD  10/16/2019 8:50 AM    [place]

## 2019-10-16 NOTE — Patient Instructions (Signed)
Medication Instructions:   Your physician recommends that you continue on your current medications as directed. Please refer to the Current Medication list given to you today.  *If you need a refill on your cardiac medications before your next appointment, please call your pharmacy*  Lab Work:  NEXT Thursday 10/23/19 AT 9:15 AM AT THE OFFICE TO CHECK--CMET, CBC, TSH, AND LIPIDS--PLEASE COME FASTING TO THIS LAB APPOINTMENT  If you have labs (blood work) drawn today and your tests are completely normal, you will receive your results only by: Marland Kitchen MyChart Message (if you have MyChart) OR . A paper copy in the mail If you have any lab test that is abnormal or we need to change your treatment, we will call you to review the results.    Follow-Up:  IN 4 MONTHS IN PERSON WITH DR. Meda Coffee ON 02/13/20 AT 8:20 AM

## 2019-10-22 DIAGNOSIS — H35371 Puckering of macula, right eye: Secondary | ICD-10-CM | POA: Diagnosis not present

## 2019-10-22 DIAGNOSIS — H52201 Unspecified astigmatism, right eye: Secondary | ICD-10-CM | POA: Diagnosis not present

## 2019-10-22 DIAGNOSIS — D23111 Other benign neoplasm of skin of right upper eyelid, including canthus: Secondary | ICD-10-CM | POA: Diagnosis not present

## 2019-10-23 ENCOUNTER — Other Ambulatory Visit: Payer: Self-pay

## 2019-10-23 ENCOUNTER — Other Ambulatory Visit: Payer: Medicare Other | Admitting: *Deleted

## 2019-10-23 DIAGNOSIS — R82998 Other abnormal findings in urine: Secondary | ICD-10-CM | POA: Diagnosis not present

## 2019-10-23 DIAGNOSIS — E782 Mixed hyperlipidemia: Secondary | ICD-10-CM

## 2019-10-23 DIAGNOSIS — R7301 Impaired fasting glucose: Secondary | ICD-10-CM | POA: Diagnosis not present

## 2019-10-23 DIAGNOSIS — I25700 Atherosclerosis of coronary artery bypass graft(s), unspecified, with unstable angina pectoris: Secondary | ICD-10-CM

## 2019-10-23 DIAGNOSIS — I10 Essential (primary) hypertension: Secondary | ICD-10-CM | POA: Diagnosis not present

## 2019-10-23 DIAGNOSIS — Z79899 Other long term (current) drug therapy: Secondary | ICD-10-CM

## 2019-10-23 DIAGNOSIS — E038 Other specified hypothyroidism: Secondary | ICD-10-CM | POA: Diagnosis not present

## 2019-10-23 DIAGNOSIS — E7849 Other hyperlipidemia: Secondary | ICD-10-CM | POA: Diagnosis not present

## 2019-10-23 DIAGNOSIS — I2510 Atherosclerotic heart disease of native coronary artery without angina pectoris: Secondary | ICD-10-CM

## 2019-10-23 DIAGNOSIS — E785 Hyperlipidemia, unspecified: Secondary | ICD-10-CM | POA: Diagnosis not present

## 2019-10-23 DIAGNOSIS — M810 Age-related osteoporosis without current pathological fracture: Secondary | ICD-10-CM | POA: Diagnosis not present

## 2019-10-23 LAB — CBC
Hematocrit: 39.6 % (ref 34.0–46.6)
Hemoglobin: 13.3 g/dL (ref 11.1–15.9)
MCH: 29.4 pg (ref 26.6–33.0)
MCHC: 33.6 g/dL (ref 31.5–35.7)
MCV: 88 fL (ref 79–97)
Platelets: 308 10*3/uL (ref 150–450)
RBC: 4.52 x10E6/uL (ref 3.77–5.28)
RDW: 13 % (ref 11.7–15.4)
WBC: 8 10*3/uL (ref 3.4–10.8)

## 2019-10-23 LAB — COMPREHENSIVE METABOLIC PANEL
ALT: 14 IU/L (ref 0–32)
AST: 15 IU/L (ref 0–40)
Albumin/Globulin Ratio: 1.8 (ref 1.2–2.2)
Albumin: 4 g/dL (ref 3.8–4.8)
Alkaline Phosphatase: 128 IU/L — ABNORMAL HIGH (ref 39–117)
BUN/Creatinine Ratio: 13 (ref 12–28)
BUN: 16 mg/dL (ref 8–27)
Bilirubin Total: 0.5 mg/dL (ref 0.0–1.2)
CO2: 21 mmol/L (ref 20–29)
Calcium: 9.2 mg/dL (ref 8.7–10.3)
Chloride: 105 mmol/L (ref 96–106)
Creatinine, Ser: 1.26 mg/dL — ABNORMAL HIGH (ref 0.57–1.00)
GFR calc Af Amer: 50 mL/min/{1.73_m2} — ABNORMAL LOW (ref >59)
GFR calc non Af Amer: 43 mL/min/{1.73_m2} — ABNORMAL LOW (ref >59)
Globulin, Total: 2.2 g/dL (ref 1.5–4.5)
Glucose: 112 mg/dL — ABNORMAL HIGH (ref 65–99)
Potassium: 4.7 mmol/L (ref 3.5–5.2)
Sodium: 142 mmol/L (ref 134–144)
Total Protein: 6.2 g/dL (ref 6.0–8.5)

## 2019-10-23 LAB — LIPID PANEL
Chol/HDL Ratio: 3 ratio (ref 0.0–4.4)
Cholesterol, Total: 157 mg/dL (ref 100–199)
HDL: 53 mg/dL (ref >39)
LDL Chol Calc (NIH): 79 mg/dL (ref 0–99)
Triglycerides: 145 mg/dL (ref 0–149)
VLDL Cholesterol Cal: 25 mg/dL (ref 5–40)

## 2019-10-23 LAB — DRAW FEE (FINGERSTICK)

## 2019-10-23 LAB — TSH: TSH: 3.03 u[IU]/mL (ref 0.450–4.500)

## 2019-10-27 DIAGNOSIS — R7301 Impaired fasting glucose: Secondary | ICD-10-CM | POA: Diagnosis not present

## 2019-10-27 DIAGNOSIS — Z1339 Encounter for screening examination for other mental health and behavioral disorders: Secondary | ICD-10-CM | POA: Diagnosis not present

## 2019-10-27 DIAGNOSIS — I129 Hypertensive chronic kidney disease with stage 1 through stage 4 chronic kidney disease, or unspecified chronic kidney disease: Secondary | ICD-10-CM | POA: Diagnosis not present

## 2019-10-27 DIAGNOSIS — Z1331 Encounter for screening for depression: Secondary | ICD-10-CM | POA: Diagnosis not present

## 2019-10-27 DIAGNOSIS — R279 Unspecified lack of coordination: Secondary | ICD-10-CM | POA: Diagnosis not present

## 2019-10-27 DIAGNOSIS — M810 Age-related osteoporosis without current pathological fracture: Secondary | ICD-10-CM | POA: Diagnosis not present

## 2019-10-27 DIAGNOSIS — Z0000 Encounter for general adult medical examination without abnormal findings: Secondary | ICD-10-CM | POA: Diagnosis not present

## 2019-10-27 DIAGNOSIS — R945 Abnormal results of liver function studies: Secondary | ICD-10-CM | POA: Diagnosis not present

## 2019-10-27 DIAGNOSIS — K219 Gastro-esophageal reflux disease without esophagitis: Secondary | ICD-10-CM | POA: Diagnosis not present

## 2019-10-27 DIAGNOSIS — R300 Dysuria: Secondary | ICD-10-CM | POA: Diagnosis not present

## 2019-10-27 DIAGNOSIS — J449 Chronic obstructive pulmonary disease, unspecified: Secondary | ICD-10-CM | POA: Diagnosis not present

## 2019-10-27 DIAGNOSIS — N1831 Chronic kidney disease, stage 3a: Secondary | ICD-10-CM | POA: Diagnosis not present

## 2019-10-27 DIAGNOSIS — F17209 Nicotine dependence, unspecified, with unspecified nicotine-induced disorders: Secondary | ICD-10-CM | POA: Diagnosis not present

## 2019-10-27 DIAGNOSIS — I519 Heart disease, unspecified: Secondary | ICD-10-CM | POA: Diagnosis not present

## 2019-10-28 ENCOUNTER — Other Ambulatory Visit: Payer: Self-pay

## 2019-10-28 ENCOUNTER — Encounter: Payer: Self-pay | Admitting: Neurology

## 2019-10-28 ENCOUNTER — Ambulatory Visit (INDEPENDENT_AMBULATORY_CARE_PROVIDER_SITE_OTHER): Payer: Medicare Other | Admitting: Neurology

## 2019-10-28 VITALS — BP 118/70 | HR 77 | Temp 96.9°F | Ht 67.0 in | Wt 202.4 lb

## 2019-10-28 DIAGNOSIS — G4489 Other headache syndrome: Secondary | ICD-10-CM

## 2019-10-28 MED ORDER — GABAPENTIN 100 MG PO CAPS: ORAL_CAPSULE | ORAL | 1 refills | Status: DC

## 2019-10-28 NOTE — Patient Instructions (Signed)
It was great to meet you! Try to wean down dose of gabapentin as tolerated

## 2019-10-28 NOTE — Progress Notes (Signed)
I have read the note, and I agree with the clinical assessment and plan.  Roneisha Stern K Craigory Toste   

## 2019-10-28 NOTE — Progress Notes (Signed)
PATIENT: Katherine Greer DOB: August 14, 1951  REASON FOR VISIT: follow up HISTORY FROM: patient  HISTORY OF PRESENT ILLNESS: Today 10/28/19  Katherine Greer is a 69 year old female with history of headaches that are now gone.  She had excellent improvement with gabapentin low-dose 300 mg daily.  She was found to have renal artery stenosis, had a stent placed on the right.  Since then, she has not had recurrent headache.  She has been hesitant to come off gabapentin, at one point she tried to decrease her dose, reporting more arthritis in her hands.  She remains on gabapentin 100 mg to 200 mg in the evening.  She recently stopped carvedilol as result of hair loss.  She denies any new problems or concerns.  She presents today for evaluation accompanied.  HISTORY 04/22/2019 Dr. Jannifer Franklin: Katherine Greer is a 69 year old right-handed white female with a history of headaches.  The patient had severe headaches when seen initially November 2019, she was placed on gabapentin and had excellent improvement in her headaches within 3 days.  She remains on a very low-dose taking a total of 300 mg daily.  She does have chronic renal insufficiency, she was found to have renal artery stenosis and may be going for angioplasty in the near future.  She is hesitant to come off of the gabapentin even though she is not having any headaches at this point.  She returns to this office for an evaluation.  REVIEW OF SYSTEMS: Out of a complete 14 system review of symptoms, the patient complains only of the following symptoms, and all other reviewed systems are negative.  N/A  ALLERGIES: Allergies  Allergen Reactions  . Augmentin [Amoxicillin-Pot Clavulanate] Anaphylaxis and Rash    Did it involve swelling of the face/tongue/throat, SOB, or low BP? Yes Did it involve sudden or severe rash/hives, skin peeling, or any reaction on the inside of your mouth or nose? Yes Did you need to seek medical attention at a hospital or doctor's office?  No When did it last happen?18 years If all above answers are "NO", may proceed with cephalosporin use.   Marland Kitchen Keflex [Cephalexin] Anaphylaxis  . Levaquin [Levofloxacin In D5w] Anaphylaxis  . Sulfa Antibiotics Anaphylaxis and Rash  . Cephalosporins     Other reaction(s): Other (See Comments) NO REACTION STATED.  . Contrast Media [Iodinated Diagnostic Agents]     Lowered kidney function   . Depakote Er [Divalproex Sodium Er] Nausea Only  . Latex Itching  . Nickel Hives    Topical dermatitis sees Dr. Wilhemina Bonito   . Nsaids     Avoid due to kidney function   . Tobramycin Swelling    redness  . Topamax [Topiramate]     unknown    HOME MEDICATIONS: Outpatient Medications Prior to Visit  Medication Sig Dispense Refill  . acetaminophen (TYLENOL) 500 MG tablet Take 1,000 mg by mouth every 6 (six) hours as needed for mild pain or moderate pain.     Marland Kitchen atorvastatin (LIPITOR) 40 MG tablet Take 1 tablet (40 mg total) by mouth at bedtime. 90 tablet 3  . Cholecalciferol 2000 units CAPS Take 2,000 Units by mouth at bedtime.     . cimetidine (TAGAMET) 200 MG tablet Take 200 mg by mouth daily as needed (acid reflux).    . clopidogrel (PLAVIX) 75 MG tablet Take 1 tablet (75 mg total) by mouth daily. 90 tablet 3  . fluticasone (FLONASE) 50 MCG/ACT nasal spray Place 1 spray into both nostrils daily as  needed for allergies or rhinitis.    . furosemide (LASIX) 40 MG tablet Take 0.5 tablets (20 mg total) by mouth daily as needed (Swelling). (Patient taking differently: Take 20 mg by mouth as needed (Swelling). ) 90 tablet 1  . halobetasol (ULTRAVATE) 0.05 % cream Apply 1 application topically 2 (two) times daily as needed (dermatitis).     . ibandronate (BONIVA) 150 MG tablet Take 150 mg by mouth every 30 (thirty) days.    Marland Kitchen levothyroxine (SYNTHROID, LEVOTHROID) 112 MCG tablet Take 112 mcg by mouth daily before breakfast.    . nicotine polacrilex (COMMIT) 4 MG lozenge Take 4 mg by mouth as needed for  smoking cessation.    Marland Kitchen spironolactone (ALDACTONE) 25 MG tablet Take 1 tablet (25 mg total) by mouth 2 (two) times daily. (Patient taking differently: Take 12.5 mg by mouth 2 (two) times daily. ) 180 tablet 3  . tolterodine (DETROL LA) 4 MG 24 hr capsule Take 4 mg by mouth daily.    Marland Kitchen triamcinolone cream (KENALOG) 0.1 % Apply 1 application topically 2 (two) times daily as needed (dermatitis).    . gabapentin (NEURONTIN) 100 MG capsule TAKE 1 CAPSULE EVERY MORNING AND 2 CAPSULES IN THE EVENING (Patient taking differently: Take 100-200 mg by mouth See admin instructions. Take 100 mg in the morning and 200 mg at night) 270 capsule 2   No facility-administered medications prior to visit.    PAST MEDICAL HISTORY: Past Medical History:  Diagnosis Date  . Abdominal bloating 05/03/2018  . Allergy   . Arthritis   . Asthma   . Cataract   . Colon polyp   . GERD (gastroesophageal reflux disease)   . Heart murmur   . Osteopenia   . Pill dysphagia 05/03/2018  . SOB (shortness of breath) 05/03/2018  . Thyroid disease   . UTI (urinary tract infection)     PAST SURGICAL HISTORY: Past Surgical History:  Procedure Laterality Date  . FOOT SURGERY Bilateral   . PERIPHERAL VASCULAR INTERVENTION Right 04/30/2019   Procedure: PERIPHERAL VASCULAR INTERVENTION;  Surgeon: Marty Heck, MD;  Location: [facility];  Service: Cardiovascular;  Laterality: Right;  Renal artery  . RENAL ANGIOGRAPHY Right 04/30/2019   Procedure: RENAL ANGIOGRAPHY;  Surgeon: Marty Heck, MD;  Location: [facility];  Service: Cardiovascular;  Laterality: Right;    FAMILY HISTORY: Family History  Problem Relation Age of Onset  . Cancer Son        Pituitary- Metastatic Brain  . Heart attack Mother   . Diabetes Father   . Chronic Renal Failure Father   . Lung cancer Father   . Breast cancer Sister   . Diabetes Brother   . Colon cancer Neg Hx   . Esophageal cancer Neg Hx   . Inflammatory bowel disease  Neg Hx   . Liver disease Neg Hx   . Pancreatic cancer Neg Hx   . Stomach cancer Neg Hx   . Rectal cancer Neg Hx     SOCIAL HISTORY: Social History   Socioeconomic History  . Marital status: Married    Spouse name: Not on file  . Number of children: 2  . Years of education: Not on file  . Highest education level: Not on file  Occupational History  . 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 Smoking status: Former Research scientist (life sciences)  . Smokeless tobacco: Never Used  . Tobacco comment: Quit 2015  Substance and  Sexual Activity  . Alcohol use: Yes    Comment: wine 4 times a week  . Drug use: Never  . Sexual activity: Yes  Other Topics Concern  . Not on file  Social History Narrative  . Not on file   Social Determinants of Health   Financial Resource Strain:   . Difficulty of Paying Living Expenses: Not on file  Food Insecurity:   . Worried About Charity fundraiser in the Last Year: Not on file  . Ran Out of Food in the Last Year: Not on file  Transportation Needs:   . Lack of Transportation (Medical): Not on file  . Lack of Transportation (Non-Medical): Not on file  Physical Activity:   . Days of Exercise per Week: Not on file  . Minutes of Exercise per Session: Not on file  Stress:   . Feeling of Stress : Not on file  Social Connections:   . Frequency of Communication with Friends and Family: Not on file  . Frequency of Social Gatherings with Friends and Family: Not on file  . Attends Religious Services: Not on file  . Active Member of Clubs or Organizations: Not on file  . Attends Archivist Meetings: Not on file  . Marital Status: Not on file  Intimate Partner Violence:   . Fear of Current or Ex-Partner: Not on file  . Emotionally Abused: Not on file  . Physically Abused: Not on file  . Sexually Abused: Not on file   PHYSICAL EXAM  Vitals:   10/28/19 1402  BP: 118/70  Pulse: 77  Temp: (!) 96.9 F (36.1 C)  TempSrc: Oral  Weight:  202 lb 6.4 oz (91.8 kg)  Height: 5\' 7"  (1.702 m)   Body mass index is 31.7 kg/m.  Generalized: Well developed, in no acute distress   Neurological examination  Mentation: Alert oriented to time, place, history taking. Follows all commands speech and language fluent Cranial nerve II-XII: Pupils were equal round reactive to light. Extraocular movements were full, visual field were full on confrontational test. Facial sensation and strength were normal. Head turning and shoulder shrug were normal and symmetric. Motor: Good strength of all extremities. Sensory: Sensory testing is intact to soft touch on all 4 extremities. No evidence of extinction is noted.  Coordination: Cerebellar testing reveals good finger-nose-finger and heel-to-shin bilaterally.  Gait and station: Gait is normal. Tandem gait is unsteady. Romberg is negative. No drift is seen.  Reflexes: Deep tendon reflexes are symmetric  DIAGNOSTIC DATA (LABS, IMAGING, TESTING) - I reviewed patient records, labs, notes, testing and imaging myself where available.  Lab Results  Component Value Date   WBC 8.0 10/23/2019   HGB 13.3 10/23/2019   HCT 39.6 10/23/2019   MCV 88 10/23/2019   PLT 308 10/23/2019      Component Value Date/Time   NA 142 10/23/2019 0757   K 4.7 10/23/2019 0757   CL 105 10/23/2019 0757   CO2 21 10/23/2019 0757   GLUCOSE 112 (H) 10/23/2019 0757   GLUCOSE 122 (H) 04/30/2019 0917   BUN 16 10/23/2019 0757   CREATININE 1.26 (H) 10/23/2019 0757   CALCIUM 9.2 10/23/2019 0757   PROT 6.2 10/23/2019 0757   ALBUMIN 4.0 10/23/2019 0757   AST 15 10/23/2019 0757   ALT 14 10/23/2019 0757   ALKPHOS 128 (H) 10/23/2019 0757   BILITOT 0.5 10/23/2019 0757   GFRNONAA 43 (L) 10/23/2019 0757   GFRAA 50 (L) 10/23/2019 0757   Lab Results  Component Value Date   CHOL 157 10/23/2019   HDL 53 10/23/2019   LDLCALC 79 10/23/2019   TRIG 145 10/23/2019   CHOLHDL 3.0 10/23/2019   No results found for: HGBA1C No results  found for: VITAMINB12 Lab Results  Component Value Date   TSH 3.030 10/23/2019   ASSESSMENT AND PLAN 69 y.o. year old female  has a past medical history of Abdominal bloating (05/03/2018), Allergy, Arthritis, Asthma, Cataract, Colon polyp, GERD (gastroesophageal reflux disease), Heart murmur, Osteopenia, Pill dysphagia (05/03/2018), SOB (shortness of breath) (05/03/2018), Thyroid disease, and UTI (urinary tract infection). here with:  1.  History of headache, well controlled, following diagnosis of renal artery stenosis and subsequent stent placement  She no longer has any headache.  She will remain on gabapentin 100 mg in the morning, 200 mg at bedtime.  She will try to slowly taper off the medication, but has felt previously that her arthritis worsened when coming off the medication.  It seems reasonable she may remain on the medication if she desires since so low dose.  I provided a refill, subsequent refills can come from her PCP.  She will follow-up with this office on an as-needed basis.  I spent 15 minutes with the patient. 50% of this time was spent discussing her plan of care.   Butler Denmark, AGNP-C, DNP 10/28/2019, 2:38 PM [facility] [address], [place],  [address] [phone]

## 2019-10-30 DIAGNOSIS — D23112 Other benign neoplasm of skin of right lower eyelid, including canthus: Secondary | ICD-10-CM | POA: Diagnosis not present

## 2019-11-08 DIAGNOSIS — Z23 Encounter for immunization: Secondary | ICD-10-CM | POA: Diagnosis not present

## 2019-11-22 IMAGING — CT CT HEART MORP W/ CTA COR W/ SCORE W/ CA W/CM &/OR W/O CM
4 of 7 series · 8 of 20 positions shown, 9 images · IV contrast (APPLIED)
Comparison: None.

Addendum:
EXAM:
OVER-READ INTERPRETATION  CT CHEST

The following report is an over-read performed by radiologist Dr.
Darminto Klamath [REDACTED] on 10/01/2018. This over-read
does not include interpretation of cardiac or coronary anatomy or
pathology. The coronary CTA interpretation by the cardiologist is
attached.
CLINICAL DATA: 67-year-old male with atypical chest pain.
Cardiac/Coronary  CT
TECHNIQUE: The patient was scanned on a Phillips Force scanner.

[Series 6: best diast 73 % · axial · 0.34mm/px · z∈[+1277,+1321]mm · 2 of 331 slices shown, 3 images]
[im 111/331  vessel]
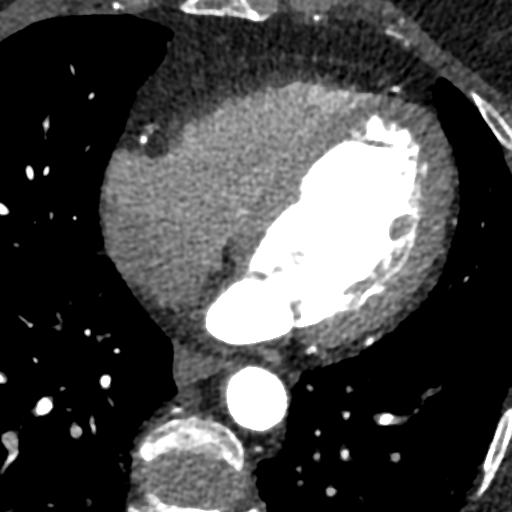
[im 111/331  lung]
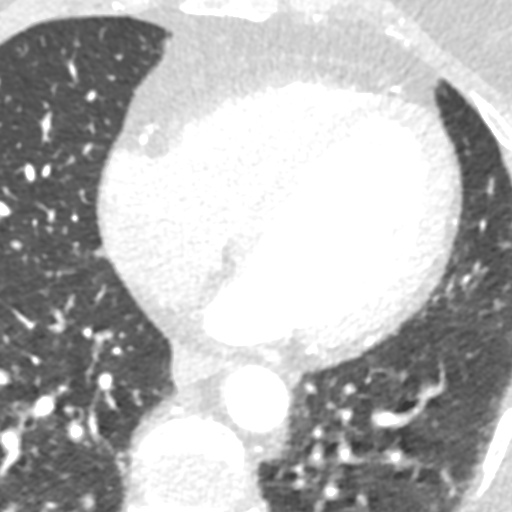
[im 221/331  vessel]
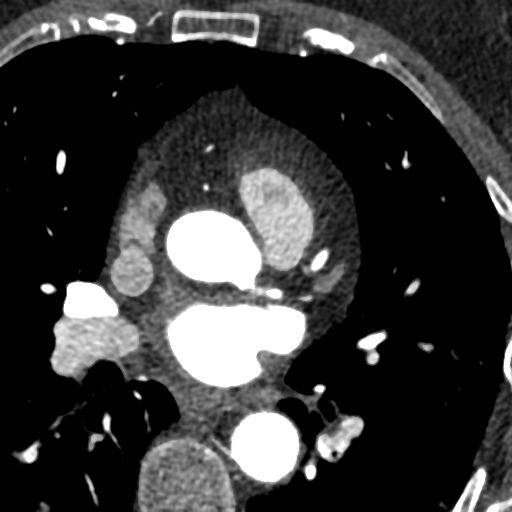

[Series 7: best syst 40 % · axial · 0.34mm/px · z∈[+1277,+1321]mm · 2 of 331 slices shown]
[im 111/331  vessel]
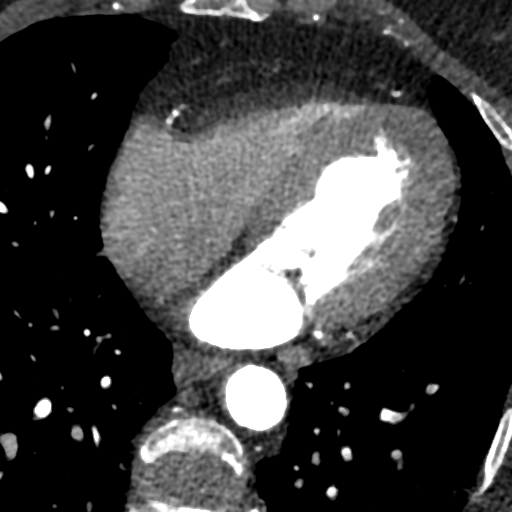
[im 221/331  vessel]
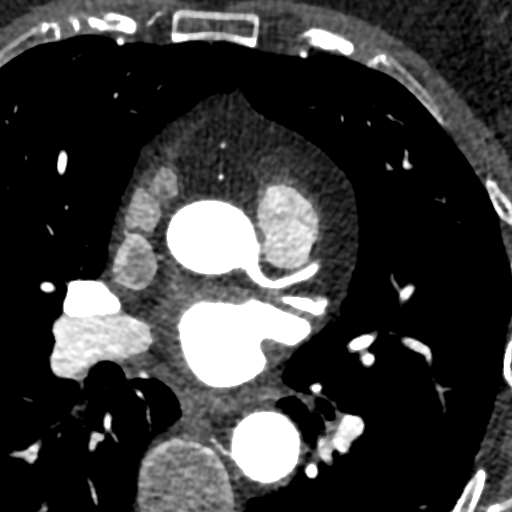

[Series 8: ts diast sharp 73 % · axial · 0.34mm/px · z∈[+1277,+1321]mm · 2 of 331 slices shown]
[im 111/331  lung]
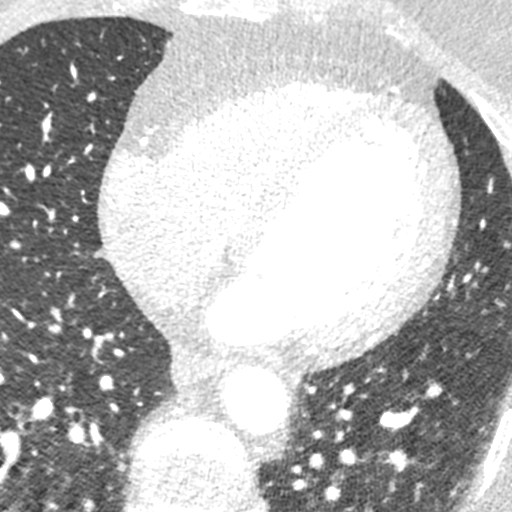
[im 221/331  lung]
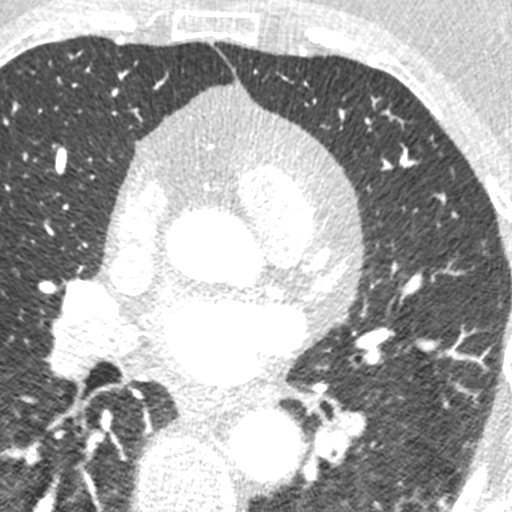

[Series 9: ts syst sharp 40 % · axial · 0.34mm/px · z∈[+1277,+1321]mm · 2 of 331 slices shown]
[im 111/331  lung]
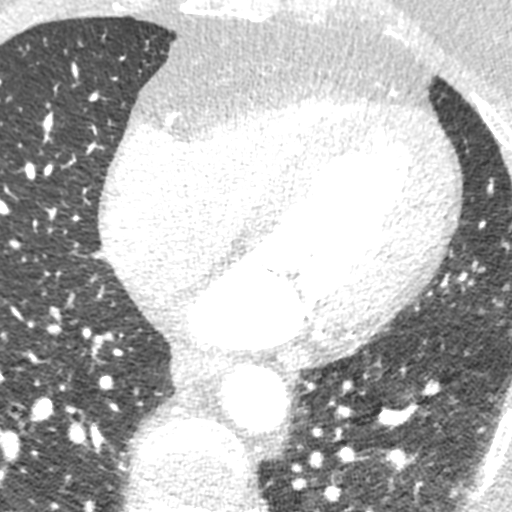
[im 221/331  lung]
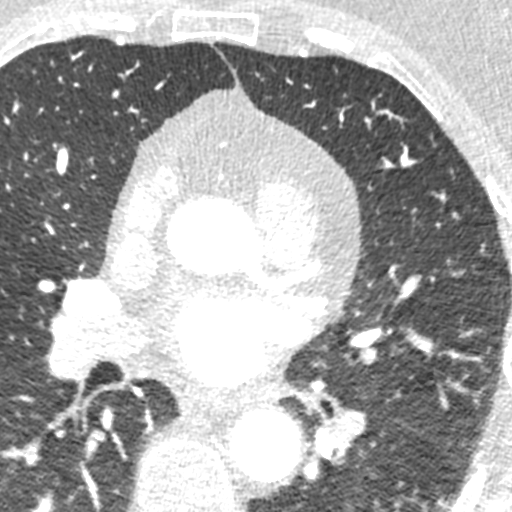

[8 of 20 positions shown; findings below may reference images not displayed]

FINDINGS: Vascular: Heart is normal size.  Visualized aorta normal caliber.

Mediastinum/Nodes: No adenopathy in the lower mediastinum or hila.
Small hiatal hernia.

Lungs/Pleura: No confluent opacities or effusions.

Upper Abdomen: Imaging into the upper abdomen shows no acute
findings.

Musculoskeletal: Chest wall soft tissues are unremarkable. No acute
bony abnormality.
IMPRESSION: Small hiatal hernia.

No acute extra cardiac abnormality.
FINDINGS: A 120 kV prospective scan was triggered in the descending thoracic
aorta at 111 HU's. Axial non-contrast 3 mm slices were carried out
through the heart. The data set was analyzed on a dedicated work
station and scored using the Agatson method. Gantry rotation speed
was 250 msecs and collimation was .6 mm. No beta blockade and 0.8 mg
of sl NTG was given. The 3D data set was reconstructed in 5%
intervals of the 67-82 % of the R-R cycle. Diastolic phases were
analyzed on a dedicated work station using MPR, MIP and VRT modes.
The patient received 80 cc of contrast.

Aorta: Normal size. Mild diffuse calcifications in the aortic root.
No dissection.

Aortic Valve:  Trileaflet.  No calcifications.

Coronary Arteries:  Normal coronary origin.  Right dominance.

RCA is a large dominant artery that gives rise to PDA and PLA. There
is minimal diffuse plaque.

Left main is a large artery that gives rise to LAD and LCX arteries.
Ostial left main has minimal calcified plaque with stenosis 0-25%.

LAD is a large vessel that gives rise to one large diagonal artery
and has no plaque.

D1 has minimal diffuse plaque.

LCX is a non-dominant artery that gives rise to two large OM
branches and has no plaque.

Other findings:

Normal pulmonary vein drainage into the left atrium.

Normal let atrial appendage without a thrombus.

Normal size of the pulmonary artery.
IMPRESSION: 1. Coronary calcium score of 46. This was 34 percentile for age and
sex matched control.

2. Normal coronary origin with right dominance.

3. Minimal diffuse CAD.  Risk factor modification is recommended.

*** End of Addendum ***

## 2019-12-06 DIAGNOSIS — M75111 Incomplete rotator cuff tear or rupture of right shoulder, not specified as traumatic: Secondary | ICD-10-CM | POA: Diagnosis not present

## 2019-12-08 ENCOUNTER — Telehealth: Payer: Self-pay | Admitting: *Deleted

## 2019-12-08 NOTE — Telephone Encounter (Signed)
Please ask vascular surgery about Plavix, thank you

## 2019-12-08 NOTE — Telephone Encounter (Signed)
   Primary Cardiologist: Ena Dawley, MD  Chart reviewed as part of pre-operative protocol coverage. Patient was contacted 12/08/2019 in reference to pre-operative risk assessment for pending surgery as outlined below.  Katherine Greer was last seen on 10/16/19 by Dr. Meda Coffee.  Since that day, Katherine Greer has done well from a cardiac standpoint. She can easily complete 4 METs without anginal complaints.   Therefore, based on ACC/AHA guidelines, the patient would be at acceptable risk for the planned procedure without further cardiovascular testing.   Per Dr. Carlis Abbott, patient can hold plavix 5 days prior to her procedure and should remain on aspirin in the perioperative setting if possible. She should restart plavix as soon as she is cleared to do so by her orthopedist.   I will route this recommendation to the requesting party via [facility] fax function and remove from pre-op pool.  Please call with questions.  Abigail Butts, PA-C 12/08/2019, 1:47 PM

## 2019-12-08 NOTE — Telephone Encounter (Signed)
   [facility] Pre-operative Risk Assessment    Request for surgical clearance:  1. What type of surgery is being performed?  RIGHT SHOULDER ARTHROSCOPE, ROTATOR CUFF REPAIR VS. CUFF DEBRIDEMENT SUBACROMIAL DECOMPRESSION3   2. When is this surgery scheduled?  TBD   3. What type of clearance is required (medical clearance vs. Pharmacy clearance to hold med vs. Both)?  BOTH  4. Are there any medications that need to be held prior to surgery and how long?  PLAVIX  5. Practice name and name of physician performing surgery?  [facility] / DR. CHANDLER   6. What is your office phone number [phone]    7.   What is your office fax number [phone]  8.   Anesthesia type (None, local, MAC, general) ?     Jeanann Lewandowsky 12/08/2019, 9:52 AM  _________________________________________________________________   (provider comments below)

## 2019-12-23 DIAGNOSIS — M75111 Incomplete rotator cuff tear or rupture of right shoulder, not specified as traumatic: Secondary | ICD-10-CM | POA: Diagnosis not present

## 2019-12-23 DIAGNOSIS — G8918 Other acute postprocedural pain: Secondary | ICD-10-CM | POA: Diagnosis not present

## 2019-12-23 DIAGNOSIS — M7521 Bicipital tendinitis, right shoulder: Secondary | ICD-10-CM | POA: Diagnosis not present

## 2019-12-23 DIAGNOSIS — M7541 Impingement syndrome of right shoulder: Secondary | ICD-10-CM | POA: Diagnosis not present

## 2019-12-31 DIAGNOSIS — Z9889 Other specified postprocedural states: Secondary | ICD-10-CM | POA: Diagnosis not present

## 2019-12-31 DIAGNOSIS — M75111 Incomplete rotator cuff tear or rupture of right shoulder, not specified as traumatic: Secondary | ICD-10-CM | POA: Diagnosis not present

## 2020-01-01 DIAGNOSIS — M25611 Stiffness of right shoulder, not elsewhere classified: Secondary | ICD-10-CM | POA: Diagnosis not present

## 2020-01-01 DIAGNOSIS — M75111 Incomplete rotator cuff tear or rupture of right shoulder, not specified as traumatic: Secondary | ICD-10-CM | POA: Diagnosis not present

## 2020-01-05 DIAGNOSIS — M75111 Incomplete rotator cuff tear or rupture of right shoulder, not specified as traumatic: Secondary | ICD-10-CM | POA: Diagnosis not present

## 2020-01-05 DIAGNOSIS — M25611 Stiffness of right shoulder, not elsewhere classified: Secondary | ICD-10-CM | POA: Diagnosis not present

## 2020-01-07 DIAGNOSIS — M25611 Stiffness of right shoulder, not elsewhere classified: Secondary | ICD-10-CM | POA: Diagnosis not present

## 2020-01-07 DIAGNOSIS — M75111 Incomplete rotator cuff tear or rupture of right shoulder, not specified as traumatic: Secondary | ICD-10-CM | POA: Diagnosis not present

## 2020-01-12 DIAGNOSIS — M75111 Incomplete rotator cuff tear or rupture of right shoulder, not specified as traumatic: Secondary | ICD-10-CM | POA: Diagnosis not present

## 2020-01-12 DIAGNOSIS — M25611 Stiffness of right shoulder, not elsewhere classified: Secondary | ICD-10-CM | POA: Diagnosis not present

## 2020-01-14 DIAGNOSIS — M75111 Incomplete rotator cuff tear or rupture of right shoulder, not specified as traumatic: Secondary | ICD-10-CM | POA: Diagnosis not present

## 2020-01-14 DIAGNOSIS — M25611 Stiffness of right shoulder, not elsewhere classified: Secondary | ICD-10-CM | POA: Diagnosis not present

## 2020-01-19 DIAGNOSIS — M75111 Incomplete rotator cuff tear or rupture of right shoulder, not specified as traumatic: Secondary | ICD-10-CM | POA: Diagnosis not present

## 2020-01-19 DIAGNOSIS — I5032 Chronic diastolic (congestive) heart failure: Secondary | ICD-10-CM | POA: Diagnosis not present

## 2020-01-19 DIAGNOSIS — M25611 Stiffness of right shoulder, not elsewhere classified: Secondary | ICD-10-CM | POA: Diagnosis not present

## 2020-01-19 DIAGNOSIS — I129 Hypertensive chronic kidney disease with stage 1 through stage 4 chronic kidney disease, or unspecified chronic kidney disease: Secondary | ICD-10-CM | POA: Diagnosis not present

## 2020-01-19 DIAGNOSIS — N1831 Chronic kidney disease, stage 3a: Secondary | ICD-10-CM | POA: Diagnosis not present

## 2020-01-19 DIAGNOSIS — I701 Atherosclerosis of renal artery: Secondary | ICD-10-CM | POA: Diagnosis not present

## 2020-01-21 DIAGNOSIS — L820 Inflamed seborrheic keratosis: Secondary | ICD-10-CM | POA: Diagnosis not present

## 2020-01-21 DIAGNOSIS — M25611 Stiffness of right shoulder, not elsewhere classified: Secondary | ICD-10-CM | POA: Diagnosis not present

## 2020-01-21 DIAGNOSIS — L309 Dermatitis, unspecified: Secondary | ICD-10-CM | POA: Diagnosis not present

## 2020-01-21 DIAGNOSIS — L853 Xerosis cutis: Secondary | ICD-10-CM | POA: Diagnosis not present

## 2020-01-21 DIAGNOSIS — M75111 Incomplete rotator cuff tear or rupture of right shoulder, not specified as traumatic: Secondary | ICD-10-CM | POA: Diagnosis not present

## 2020-01-21 DIAGNOSIS — D485 Neoplasm of uncertain behavior of skin: Secondary | ICD-10-CM | POA: Diagnosis not present

## 2020-01-21 DIAGNOSIS — L570 Actinic keratosis: Secondary | ICD-10-CM | POA: Diagnosis not present

## 2020-01-27 DIAGNOSIS — M25611 Stiffness of right shoulder, not elsewhere classified: Secondary | ICD-10-CM | POA: Diagnosis not present

## 2020-01-27 DIAGNOSIS — M75111 Incomplete rotator cuff tear or rupture of right shoulder, not specified as traumatic: Secondary | ICD-10-CM | POA: Diagnosis not present

## 2020-01-30 DIAGNOSIS — M75111 Incomplete rotator cuff tear or rupture of right shoulder, not specified as traumatic: Secondary | ICD-10-CM | POA: Diagnosis not present

## 2020-01-30 DIAGNOSIS — M25611 Stiffness of right shoulder, not elsewhere classified: Secondary | ICD-10-CM | POA: Diagnosis not present

## 2020-02-02 DIAGNOSIS — M25611 Stiffness of right shoulder, not elsewhere classified: Secondary | ICD-10-CM | POA: Diagnosis not present

## 2020-02-02 DIAGNOSIS — M75111 Incomplete rotator cuff tear or rupture of right shoulder, not specified as traumatic: Secondary | ICD-10-CM | POA: Diagnosis not present

## 2020-02-05 DIAGNOSIS — M75111 Incomplete rotator cuff tear or rupture of right shoulder, not specified as traumatic: Secondary | ICD-10-CM | POA: Diagnosis not present

## 2020-02-05 DIAGNOSIS — M25611 Stiffness of right shoulder, not elsewhere classified: Secondary | ICD-10-CM | POA: Diagnosis not present

## 2020-02-09 DIAGNOSIS — M25611 Stiffness of right shoulder, not elsewhere classified: Secondary | ICD-10-CM | POA: Diagnosis not present

## 2020-02-09 DIAGNOSIS — M75111 Incomplete rotator cuff tear or rupture of right shoulder, not specified as traumatic: Secondary | ICD-10-CM | POA: Diagnosis not present

## 2020-02-11 DIAGNOSIS — M75111 Incomplete rotator cuff tear or rupture of right shoulder, not specified as traumatic: Secondary | ICD-10-CM | POA: Diagnosis not present

## 2020-02-11 DIAGNOSIS — M25611 Stiffness of right shoulder, not elsewhere classified: Secondary | ICD-10-CM | POA: Diagnosis not present

## 2020-02-13 ENCOUNTER — Other Ambulatory Visit: Payer: Self-pay

## 2020-02-13 ENCOUNTER — Encounter: Payer: Self-pay | Admitting: Cardiology

## 2020-02-13 ENCOUNTER — Ambulatory Visit (INDEPENDENT_AMBULATORY_CARE_PROVIDER_SITE_OTHER): Payer: Medicare Other | Admitting: Cardiology

## 2020-02-13 VITALS — BP 128/76 | HR 80 | Ht 67.0 in | Wt 199.0 lb

## 2020-02-13 DIAGNOSIS — I701 Atherosclerosis of renal artery: Secondary | ICD-10-CM | POA: Diagnosis not present

## 2020-02-13 DIAGNOSIS — I5032 Chronic diastolic (congestive) heart failure: Secondary | ICD-10-CM

## 2020-02-13 DIAGNOSIS — E782 Mixed hyperlipidemia: Secondary | ICD-10-CM

## 2020-02-13 DIAGNOSIS — N183 Chronic kidney disease, stage 3 unspecified: Secondary | ICD-10-CM | POA: Diagnosis not present

## 2020-02-13 DIAGNOSIS — I2510 Atherosclerotic heart disease of native coronary artery without angina pectoris: Secondary | ICD-10-CM

## 2020-02-13 DIAGNOSIS — I10 Essential (primary) hypertension: Secondary | ICD-10-CM

## 2020-02-13 NOTE — Patient Instructions (Signed)
Medication Instructions:  ° °Your physician recommends that you continue on your current medications as directed. Please refer to the Current Medication list given to you today. ° °*If you need a refill on your cardiac medications before your next appointment, please call your pharmacy* ° °Follow-Up: °At [facility], you and your health needs are our priority.  As part of our continuing mission to provide you with exceptional heart care, we have created designated Provider Care Teams.  These Care Teams include your primary Cardiologist (physician) and Advanced Practice Providers (APPs -  Physician Assistants and Nurse Practitioners) who all work together to provide you with the care you need, when you need it. ° °We recommend signing up for the patient portal called "MyChart".  Sign up information is provided on this After Visit Summary.  MyChart is used to connect with patients for Virtual Visits (Telemedicine).  Patients are able to view lab/test results, encounter notes, upcoming appointments, etc.  Non-urgent messages can be sent to your provider as well.   °To learn more about what you can do with MyChart, go to [email or web address].   ° °Your next appointment:   °12 month(s) ° °The format for your next appointment:   °In Person ° °Provider:   °Katarina Nelson, MD ° ° ° ° °

## 2020-02-13 NOTE — Progress Notes (Signed)
Cardiology Office Note    Date:  02/13/2020   ID:  Katherine Greer, DOB 1951/02/17, MRN [id number]  PCP:  Crist Infante, MD  Cardiologist: Ena Dawley, MD EPS: None  Reason for visit: 6 months follow-up  History of Present Illness:  Katherine Greer is a 69 y.o. female with long history of intractable headaches and extensive work-up at [facility], hypertension, HLD, CKD stage III family history of CAD with her mother having an MI in her 38s.  Worsening dyspnea on exertion, chronic diastolic CHF, echo 09/4266 LVEF 60 to 65% with grade 1 DD and increased filling pressures.  Coronary CTA 09/2018 minimal nonobstructive CAD.  Carvedilol increased to 6.25 mg twice daily and increase exercise recommended.  Renal artery stenosis status post stenting 04/30/2019.  Patient's BP has come down since renal artery stenting and spironolactone decreased and Lasix stopped. Complains of facial hair on lower dose spironolactone and swelling off Lasix. BP readings at home 125-135/70. Watching salt closely.  Blood pressure lower today but she thinks she may have taken to carvedilol this morning accidentally.  02/13/2020 -the patient is coming after 6 months, she has been doing great, she underwent rotator cuff surgery and is undergoing rehab.  She walks about a mile 4 times a week without any symptoms of chest pain or shortness of breath.  Her blood pressure is always controlled.  She has not had any problems with lower extremity edema and did not need to use Lasix as needed.  She tolerates her atorvastatin well her only complaint is easy bruising.  Hair loss with carvedilol so she discontinued it and her hair loss resolved.  Past Medical History:  Diagnosis Date  . Abdominal bloating 05/03/2018  . Allergy   . Arthritis   . Asthma   . Cataract   . Colon polyp   . GERD (gastroesophageal reflux disease)   . Heart murmur   . Osteopenia   . Pill dysphagia 05/03/2018  . SOB (shortness of breath) 05/03/2018  . Thyroid disease   .  UTI (urinary tract infection)    Past Surgical History:  Procedure Laterality Date  . FOOT SURGERY Bilateral   . PERIPHERAL VASCULAR INTERVENTION Right 04/30/2019   Procedure: PERIPHERAL VASCULAR INTERVENTION;  Surgeon: Marty Heck, MD;  Location: [facility];  Service: Cardiovascular;  Laterality: Right;  Renal artery  . RENAL ANGIOGRAPHY Right 04/30/2019   Procedure: RENAL ANGIOGRAPHY;  Surgeon: Marty Heck, MD;  Location: [place] [facility];  Service: Cardiovascular;  Laterality: Right;   Current Medications: Current Meds  Medication Sig  . acetaminophen (TYLENOL) 500 MG tablet Take 1,000 mg by mouth every 6 (six) hours as needed for mild pain or moderate pain.   Marland Kitchen atorvastatin (LIPITOR) 40 MG tablet Take 1 tablet (40 mg total) by mouth at bedtime.  . Cholecalciferol 2000 units CAPS Take 2,000 Units by mouth at bedtime.   . cimetidine (TAGAMET) 200 MG tablet Take 200 mg by mouth daily as needed (acid reflux).  . clopidogrel (PLAVIX) 75 MG tablet Take 1 tablet (75 mg total) by mouth daily.  . fluticasone (FLONASE) 50 MCG/ACT nasal spray Place 1 spray into both nostrils daily as needed for allergies or rhinitis.  . furosemide (LASIX) 40 MG tablet Take 0.5 tablets (20 mg total) by mouth daily as needed (Swelling).  . gabapentin (NEURONTIN) 100 MG capsule Take 100 mg by mouth 2 (two) times daily.  . halobetasol (ULTRAVATE) 0.05 % cream Apply 1 application topically 2 (two) times  daily as needed (dermatitis).   . ibandronate (BONIVA) 150 MG tablet Take 150 mg by mouth every 30 (thirty) days.  Marland Kitchen levothyroxine (SYNTHROID, LEVOTHROID) 112 MCG tablet Take 112 mcg by mouth daily before breakfast.  . nicotine polacrilex (COMMIT) 4 MG lozenge Take 4 mg by mouth as needed for smoking cessation.  Marland Kitchen spironolactone (ALDACTONE) 25 MG tablet Take 1 tablet (25 mg total) by mouth 2 (two) times daily.  Marland Kitchen tolterodine (DETROL LA) 4 MG 24 hr capsule Take 4 mg by mouth daily.  Marland Kitchen  triamcinolone cream (KENALOG) 0.1 % Apply 1 application topically 2 (two) times daily as needed (dermatitis).     Allergies:   Augmentin [amoxicillin-pot clavulanate], Keflex [cephalexin], Levaquin [levofloxacin in d5w], Sulfa antibiotics, Cephalosporins, Contrast media [iodinated diagnostic agents], Depakote er [divalproex sodium er], Latex, Nickel, Nsaids, Tobramycin, Topamax [topiramate], and Tramadol   Social History   Socioeconomic History  . Marital status: Married    Spouse name: Not on file  . Number of children: 2  . Years of education: Not on file  . Highest education level: Not on file  Occupational History  . 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 Smoking status: Former Research scientist (life sciences)  . Smokeless tobacco: Never Used  . Tobacco comment: Quit 2015  Vaping Use  . Vaping Use: Never used  Substance and Sexual Activity  . Alcohol use: Yes    Comment: wine 4 times a week  . Drug use: Never  . Sexual activity: Yes  Other Topics Concern  . Not on file  Social History Narrative  . Not on file   Social Determinants of Health   Financial Resource Strain:   . Difficulty of Paying Living Expenses:   Food Insecurity:   . Worried About Charity fundraiser in the Last Year:   . Arboriculturist in the Last Year:   Transportation Needs:   . Film/video editor (Medical):   Marland Kitchen Lack of Transportation (Non-Medical):   Physical Activity:   . Days of Exercise per Week:   . Minutes of Exercise per Session:   Stress:   . Feeling of Stress :   Social Connections:   . Frequency of Communication with Friends and Family:   . Frequency of Social Gatherings with Friends and Family:   . Attends Religious Services:   . Active Member of Clubs or Organizations:   . Attends Archivist Meetings:   Marland Kitchen Marital Status:     Family History:  The patient's family history includes Breast cancer in her sister; Cancer in her son; Chronic Renal Failure in her father;  Diabetes in her brother and father; Heart attack in her mother; Lung cancer in her father.   ROS:   Please see the history of present illness.    ROS All other systems reviewed and are negative.  PHYSICAL EXAM:    VS:  BP 128/76   Pulse 80   Ht 5\' 7"  (1.702 m)   Wt 199 lb (90.3 kg)   SpO2 94%   BMI 31.17 kg/m   Physical Exam  GEN: Well nourished, well developed, in no acute distress  Neck: no JVD, carotid bruits, or masses Cardiac:RRR; no murmurs, rubs, or gallops  Respiratory:  clear to auscultation bilaterally, normal work of breathing GI: soft, nontender, nondistended, + BS Ext: without cyanosis, clubbing, or edema, Good distal pulses bilaterally Neuro:  Alert and Oriented x 3 Psych: euthymic mood, full affect  Wt Readings from Last 3 Encounters:  02/13/20 199 lb (90.3 kg)  10/28/19 202 lb 6.4 oz (91.8 kg)  10/16/19 195 lb (88.5 kg)     Studies/Labs Reviewed:   EKG:  EKG is  ordered today.  The ekg ordered today demonstrates normal sinus rhythm at 73 bpm normal EKG  Recent Labs: 07/01/2019: NT-Pro BNP 178 10/23/2019: ALT 14; BUN 16; Creatinine, Ser 1.26; Hemoglobin 13.3; Platelets 308; Potassium 4.7; Sodium 142; TSH 3.030   Lipid Panel    Component Value Date/Time   CHOL 157 10/23/2019 0757   TRIG 145 10/23/2019 0757   HDL 53 10/23/2019 0757   CHOLHDL 3.0 10/23/2019 0757   LDLCALC 79 10/23/2019 0757    Additional studies/ records that were reviewed today include:  2D echo 1/2020TTE: 08/2018   Left ventricle: The cavity size was normal. There was mild   concentric hypertrophy. Systolic function was normal. The   estimated ejection fraction was in the range of 60% to 65%. Wall   motion was normal; there were no regional wall motion   abnormalities. Doppler parameters are consistent with abnormal   left ventricular relaxation (grade 1 diastolic dysfunction).   Doppler parameters are consistent with elevated ventricular   end-diastolic filling pressure. -  Aortic valve: There was mild regurgitation. - Right ventricle: The cavity size was normal. Wall thickness was   normal. Systolic function was normal. - Right atrium: The atrium was normal in size. - Tricuspid valve: There was trivial regurgitation. - Pulmonary arteries: Systolic pressure was within the normal   range. - Inferior vena cava: The vessel was normal in size. - Pericardium, extracardiac: There was no pericardial effusion.   Coronary CTA: 09/2018 1. Coronary calcium score of 46. This was 40 percentile for age and sex matched control. 2. Normal coronary origin with right dominance. 3. Minimal diffuse CAD.  Risk factor modification is recommended.    ASSESSMENT:    1. Essential hypertension   2. Chronic diastolic CHF (congestive heart failure) ([place])   3. Coronary artery disease involving native coronary artery of native heart without angina pectoris   4. Mixed hyperlipidemia   5. Stage 3 chronic kidney disease, unspecified whether stage 3a or 3b CKD   6. Renal artery stenosis (H[facility])     PLAN:  In order of problems listed above:  Chronic diastolic CHF normal LVEF with grade 1 DD on echo 08/2018-she appears euvolemic, continues to take renal lactone 25 mg p.o. twice daily, no recent need for extra Lasix.  Essential hypertension -controlled  Nonobstructive CAD on coronary CTA 09/2018, asymptomatic  Hyperlipidemia on atorvastatin LDL 79-07/01/19, tolerates atorvastatin well.  CKD stage III creatinine 1.26 07/01/2019 recheck renal in 2 weeks  Renal artery stenosis status post stenting 04/30/2019, blood pressure controlled ever since.  Medication Adjustments/Labs and Tests Ordered: Current medicines are reviewed at length with the patient today.  Concerns regarding medicines are outlined above.  Medication changes, Labs and Tests ordered today are listed in the Patient Instructions below. Patient Instructions  Medication Instructions:   Your physician recommends that you  continue on your current medications as directed. Please refer to the Current Medication list given to you today.  *If you need a refill on your cardiac medications before your next appointment, please call your pharmacy*   Follow-Up: At [facility], you and your health needs are our priority.  As part of our continuing mission to provide you with exceptional heart care, we have created designated Provider Care Teams.  These  Care Teams include your primary Cardiologist (physician) and Advanced Practice Providers (APPs -  Physician Assistants and Nurse Practitioners) who all work together to provide you with the care you need, when you need it.  We recommend signing up for the patient portal called "MyChart".  Sign up information is provided on this After Visit Summary.  MyChart is used to connect with patients for Virtual Visits (Telemedicine).  Patients are able to view lab/test results, encounter notes, upcoming appointments, etc.  Non-urgent messages can be sent to your provider as well.   To learn more about what you can do with MyChart, go to [email or web address].    Your next appointment:   12 month(s)  The format for your next appointment:   In Person  Provider:   Ena Dawley, MD        Signed, Ena Dawley, MD  02/13/2020 8:58 AM    [address], [place],   [address] Phone: [phone]; Fax: [phone]

## 2020-03-11 ENCOUNTER — Other Ambulatory Visit: Payer: Self-pay | Admitting: *Deleted

## 2020-03-11 DIAGNOSIS — I701 Atherosclerosis of renal artery: Secondary | ICD-10-CM

## 2020-03-12 DIAGNOSIS — M75111 Incomplete rotator cuff tear or rupture of right shoulder, not specified as traumatic: Secondary | ICD-10-CM | POA: Diagnosis not present

## 2020-03-12 DIAGNOSIS — M25611 Stiffness of right shoulder, not elsewhere classified: Secondary | ICD-10-CM | POA: Diagnosis not present

## 2020-03-15 DIAGNOSIS — M25611 Stiffness of right shoulder, not elsewhere classified: Secondary | ICD-10-CM | POA: Diagnosis not present

## 2020-03-15 DIAGNOSIS — M75111 Incomplete rotator cuff tear or rupture of right shoulder, not specified as traumatic: Secondary | ICD-10-CM | POA: Diagnosis not present

## 2020-03-19 ENCOUNTER — Ambulatory Visit (HOSPITAL_COMMUNITY)
Admission: RE | Admit: 2020-03-19 | Discharge: 2020-03-19 | Disposition: A | Discharge status: Home / Self Care | Payer: Medicare Other | Source: Ambulatory Visit | Attending: Vascular Surgery | Admitting: Vascular Surgery

## 2020-03-19 ENCOUNTER — Other Ambulatory Visit: Payer: Self-pay

## 2020-03-19 DIAGNOSIS — M75111 Incomplete rotator cuff tear or rupture of right shoulder, not specified as traumatic: Secondary | ICD-10-CM | POA: Diagnosis not present

## 2020-03-19 DIAGNOSIS — I701 Atherosclerosis of renal artery: Secondary | ICD-10-CM | POA: Diagnosis not present

## 2020-03-19 DIAGNOSIS — M25611 Stiffness of right shoulder, not elsewhere classified: Secondary | ICD-10-CM | POA: Diagnosis not present

## 2020-03-22 DIAGNOSIS — M25611 Stiffness of right shoulder, not elsewhere classified: Secondary | ICD-10-CM | POA: Diagnosis not present

## 2020-03-22 DIAGNOSIS — M75111 Incomplete rotator cuff tear or rupture of right shoulder, not specified as traumatic: Secondary | ICD-10-CM | POA: Diagnosis not present

## 2020-03-23 ENCOUNTER — Encounter: Payer: Self-pay | Admitting: Vascular Surgery

## 2020-03-23 ENCOUNTER — Ambulatory Visit (INDEPENDENT_AMBULATORY_CARE_PROVIDER_SITE_OTHER): Payer: Medicare Other | Admitting: Vascular Surgery

## 2020-03-23 ENCOUNTER — Other Ambulatory Visit: Payer: Self-pay

## 2020-03-23 ENCOUNTER — Telehealth: Payer: Self-pay

## 2020-03-23 VITALS — BP 121/73 | HR 78 | Temp 97.3°F | Resp 16 | Ht 67.0 in | Wt 192.0 lb

## 2020-03-23 DIAGNOSIS — I701 Atherosclerosis of renal artery: Secondary | ICD-10-CM | POA: Diagnosis not present

## 2020-03-23 NOTE — Telephone Encounter (Signed)
Pt saw Dr. Carlis Abbott this morning and they discussed switching from Plavix to 81 mg Aspirin. Pt called asking if this was okay since she has a renal artery stent and was told by Dr. Joylene Draft to avoid aspirin products. Dr. Carlis Abbott has advised her to check with Dr. Joylene Draft on this-pt is aware and going to call [facility] to discuss.

## 2020-03-23 NOTE — Progress Notes (Signed)
Patient name: Katherine Greer MRN: [id number] DOB: 1951/07/05 Sex: female  REASON FOR VISIT: 9 month follow-up after right renal artery stent for high-grade renal artery stenosis  HPI: Katherine Greer is a 69 y.o. female presents for 9 month follow-up after right renal artery stent for high-grade stenosis.  Her right renal artery stent was placed on 04/30/2019 via right common femoral artery approach.  She was having very high swings in her blood pressure even while on optimal medical management.  Last time I saw her she was on carvedilol and spironolactone.  She states her blood pressures has been very well controlled with no more wild swings in her blood pressure and now she is down to just spironolactone.  NO specific concerns today.  Husband just underwent TAVR.  Past Medical History:  Diagnosis Date  . Abdominal bloating 05/03/2018  . Allergy   . Arthritis   . Asthma   . Cataract   . Colon polyp   . GERD (gastroesophageal reflux disease)   . Heart murmur   . Osteopenia   . Pill dysphagia 05/03/2018  . SOB (shortness of breath) 05/03/2018  . Thyroid disease   . UTI (urinary tract infection)     Past Surgical History:  Procedure Laterality Date  . FOOT SURGERY Bilateral   . PERIPHERAL VASCULAR INTERVENTION Right 04/30/2019   Procedure: PERIPHERAL VASCULAR INTERVENTION;  Surgeon: Marty Heck, MD;  Location: [place] [facility];  Service: Cardiovascular;  Laterality: Right;  Renal artery  . RENAL ANGIOGRAPHY Right 04/30/2019   Procedure: RENAL ANGIOGRAPHY;  Surgeon: Marty Heck, MD;  Location: [place] [facility];  Service: Cardiovascular;  Laterality: Right;    Family History  Problem Relation Age of Onset  . Cancer Son        Pituitary- Metastatic Brain  . Heart attack Mother   . Diabetes Father   . Chronic Renal Failure Father   . Lung cancer Father   . Breast cancer Sister   . Diabetes Brother   . Colon cancer Neg Hx   . Esophageal cancer Neg Hx   . Inflammatory bowel  disease Neg Hx   . Liver disease Neg Hx   . Pancreatic cancer Neg Hx   . Stomach cancer Neg Hx   . Rectal cancer Neg Hx     SOCIAL HISTORY: Social History   Tobacco Use  . Smoking status: Former Research scientist (life sciences)  . Smokeless tobacco: Never Used  . Tobacco comment: Quit 2015  Substance Use Topics  . Alcohol use: Yes    Comment: wine 4 times a week    Allergies  Allergen Reactions  . Augmentin [Amoxicillin-Pot Clavulanate] Anaphylaxis and Rash    Did it involve swelling of the face/tongue/throat, SOB, or low BP? Yes Did it involve sudden or severe rash/hives, skin peeling, or any reaction on the inside of your mouth or nose? Yes Did you need to seek medical attention at a hospital or doctor's office? No When did it last happen?18 years If all above answers are "NO", may proceed with cephalosporin use.   Marland Kitchen Keflex [Cephalexin] Anaphylaxis  . Levaquin [Levofloxacin In D5w] Anaphylaxis  . Sulfa Antibiotics Anaphylaxis and Rash  . Carvedilol Other (See Comments)    Pt reports carvedilol causes her hair loss.   . Cephalosporins     Other reaction(s): Other (See Comments) NO REACTION STATED.  . Contrast Media [Iodinated Diagnostic Agents]     Lowered kidney function   . Depakote Er [Divalproex Sodium Er] Nausea  Only  . Latex Itching  . Nickel Hives    Topical dermatitis sees Dr. Wilhemina Bonito   . Nsaids     Avoid due to kidney function   . Tobramycin Swelling    redness  . Topamax [Topiramate]     unknown  . Tramadol     Per pt this causes flushing and facial swelling    Current Outpatient Medications  Medication Sig Dispense Refill  . acetaminophen (TYLENOL) 500 MG tablet Take 1,000 mg by mouth every 6 (six) hours as needed for mild pain or moderate pain.     Marland Kitchen atorvastatin (LIPITOR) 40 MG tablet Take 1 tablet (40 mg total) by mouth at bedtime. 90 tablet 3  . Cholecalciferol 2000 units CAPS Take 2,000 Units by mouth at bedtime.     . cimetidine (TAGAMET) 200 MG tablet Take  200 mg by mouth daily as needed (acid reflux).    . clopidogrel (PLAVIX) 75 MG tablet Take 1 tablet (75 mg total) by mouth daily. 90 tablet 3  . fluticasone (FLONASE) 50 MCG/ACT nasal spray Place 1 spray into both nostrils daily as needed for allergies or rhinitis.    . furosemide (LASIX) 40 MG tablet Take 0.5 tablets (20 mg total) by mouth daily as needed (Swelling). 90 tablet 1  . gabapentin (NEURONTIN) 100 MG capsule Take 100 mg by mouth 2 (two) times daily.    . halobetasol (ULTRAVATE) 0.05 % cream Apply 1 application topically 2 (two) times daily as needed (dermatitis).     . ibandronate (BONIVA) 150 MG tablet Take 150 mg by mouth every 30 (thirty) days.    Marland Kitchen levothyroxine (SYNTHROID, LEVOTHROID) 112 MCG tablet Take 112 mcg by mouth daily before breakfast.    . nicotine polacrilex (COMMIT) 4 MG lozenge Take 4 mg by mouth as needed for smoking cessation.    Marland Kitchen spironolactone (ALDACTONE) 25 MG tablet Take 1 tablet (25 mg total) by mouth 2 (two) times daily. 180 tablet 3  . tolterodine (DETROL LA) 4 MG 24 hr capsule Take 4 mg by mouth daily.    Marland Kitchen triamcinolone cream (KENALOG) 0.1 % Apply 1 application topically 2 (two) times daily as needed (dermatitis).     No current facility-administered medications for this visit.    REVIEW OF SYSTEMS:  [X]  denotes positive finding, [ ]  denotes negative finding Cardiac  Comments:  Chest pain or chest pressure:    Shortness of breath upon exertion:    Short of breath when lying flat:    Irregular heart rhythm:        Vascular    Pain in calf, thigh, or hip brought on by ambulation:    Pain in feet at night that wakes you up from your sleep:     Blood clot in your veins:    Leg swelling:         Pulmonary    Oxygen at home:    Productive cough:     Wheezing:         Neurologic    Sudden weakness in arms or legs:     Sudden numbness in arms or legs:     Sudden onset of difficulty speaking or slurred speech:    Temporary loss of vision in one  eye:     Problems with dizziness:         Gastrointestinal    Blood in stool:     Vomited blood:         Genitourinary  Burning when urinating:     Blood in urine:        Psychiatric    Major depression:         Hematologic    Bleeding problems:    Problems with blood clotting too easily:        Skin    Rashes or ulcers:        Constitutional    Fever or chills:      PHYSICAL EXAM: Vitals:   03/23/20 1127  BP: 121/73  Pulse: 78  Resp: 16  Temp: (!) 97.3 F (36.3 C)  TempSrc: Temporal  SpO2: 99%  Weight: 192 lb (87.1 kg)  Height: 5\' 7"  (1.702 m)    GENERAL: The patient is a well-nourished female, in no acute distress. The vital signs are documented above. Abdomen: soft, ND, NT Vascular: Palpable femoral pulses bilaterally.  Palpable dorsalis pedis pulses bilaterally.  DATA:   Right renal artery stent looks widely patent on duplex today.  Assessment/Plan:  69 year old female who presents for 18-month follow-up after right renal artery angioplasty with stent placement on 04/30/19 for high-grade right renal artery stenosis.  Overall she seems to be doing very well.  She has had much less fluctuations in her blood pressure and is now down to just spironolactone.  Her stent looks widely patent on duplex today.  She wants to try and get off the Plavix and discussed she could try 81 mg aspirin which she has been intolerant of higher aspirin doses in the past.  We will have her follow-up in 1 year for repeat duplex and ongoing surveillance.  She knows to call with questions or concerns.   Marty Heck, MD [facility] Office: [phone]

## 2020-03-29 DIAGNOSIS — M75111 Incomplete rotator cuff tear or rupture of right shoulder, not specified as traumatic: Secondary | ICD-10-CM | POA: Diagnosis not present

## 2020-03-29 DIAGNOSIS — M25611 Stiffness of right shoulder, not elsewhere classified: Secondary | ICD-10-CM | POA: Diagnosis not present

## 2020-04-06 DIAGNOSIS — M545 Low back pain: Secondary | ICD-10-CM | POA: Diagnosis not present

## 2020-04-07 DIAGNOSIS — M75111 Incomplete rotator cuff tear or rupture of right shoulder, not specified as traumatic: Secondary | ICD-10-CM | POA: Diagnosis not present

## 2020-04-07 DIAGNOSIS — L309 Dermatitis, unspecified: Secondary | ICD-10-CM | POA: Diagnosis not present

## 2020-04-07 DIAGNOSIS — M25611 Stiffness of right shoulder, not elsewhere classified: Secondary | ICD-10-CM | POA: Diagnosis not present

## 2020-04-07 DIAGNOSIS — L218 Other seborrheic dermatitis: Secondary | ICD-10-CM | POA: Diagnosis not present

## 2020-04-09 DIAGNOSIS — M25611 Stiffness of right shoulder, not elsewhere classified: Secondary | ICD-10-CM | POA: Diagnosis not present

## 2020-04-09 DIAGNOSIS — M75111 Incomplete rotator cuff tear or rupture of right shoulder, not specified as traumatic: Secondary | ICD-10-CM | POA: Diagnosis not present

## 2020-04-12 DIAGNOSIS — M75111 Incomplete rotator cuff tear or rupture of right shoulder, not specified as traumatic: Secondary | ICD-10-CM | POA: Diagnosis not present

## 2020-04-12 DIAGNOSIS — M25611 Stiffness of right shoulder, not elsewhere classified: Secondary | ICD-10-CM | POA: Diagnosis not present

## 2020-04-21 ENCOUNTER — Other Ambulatory Visit: Payer: Self-pay | Admitting: *Deleted

## 2020-04-21 MED ORDER — SPIRONOLACTONE 25 MG PO TABS: 25.0000 mg | ORAL_TABLET | Freq: Two times a day (BID) | ORAL | 0 refills | Status: DC

## 2020-04-21 NOTE — Telephone Encounter (Signed)
Refill for spironolactone 25 mg po bid sent to the pts confirmed pharmacy of choice.  She inquired about this refill, while being in the office with her Husband today when he was being seen.  Pt is in compliance with following up as scheduled with Dr. Meda Coffee.  Pt appreciative for all the assistance provided.

## 2020-04-26 DIAGNOSIS — Z09 Encounter for follow-up examination after completed treatment for conditions other than malignant neoplasm: Secondary | ICD-10-CM | POA: Diagnosis not present

## 2020-05-05 ENCOUNTER — Telehealth: Payer: Self-pay | Admitting: Neurology

## 2020-05-05 NOTE — Telephone Encounter (Signed)
Called and spoke with pt. Advised I reviewed last OV note from SS,NP on 10/28/19.  Advised per Judson Roch, she should request refill from PCP moving forward since she is to follow up with [place] as needed. If she has any issues getting refill from PCP, she should contact us back.   Per SS,NP from 10/28/19:"She no longer has any headache.  She will remain on gabapentin 100 mg in the morning, 200 mg at bedtime.  She will try to slowly taper off the medication, but has felt previously that her arthritis worsened when coming off the medication.  It seems reasonable she may remain on the medication if she desires since so low dose.  I provided a refill, subsequent refills can come from her PCP.  She will follow-up with this office on an as-needed basis."

## 2020-05-05 NOTE — Telephone Encounter (Signed)
Pt called needing a refill on her gabapentin (NEURONTIN) 100 MG capsule but she needs it sent to the [facility] on [address] from now on

## 2020-05-10 DIAGNOSIS — M810 Age-related osteoporosis without current pathological fracture: Secondary | ICD-10-CM | POA: Diagnosis not present

## 2020-05-10 DIAGNOSIS — N1831 Chronic kidney disease, stage 3a: Secondary | ICD-10-CM | POA: Diagnosis not present

## 2020-05-10 DIAGNOSIS — K219 Gastro-esophageal reflux disease without esophagitis: Secondary | ICD-10-CM | POA: Diagnosis not present

## 2020-05-10 DIAGNOSIS — E785 Hyperlipidemia, unspecified: Secondary | ICD-10-CM | POA: Diagnosis not present

## 2020-05-10 DIAGNOSIS — J449 Chronic obstructive pulmonary disease, unspecified: Secondary | ICD-10-CM | POA: Diagnosis not present

## 2020-05-10 DIAGNOSIS — J439 Emphysema, unspecified: Secondary | ICD-10-CM | POA: Diagnosis not present

## 2020-05-10 DIAGNOSIS — I10 Essential (primary) hypertension: Secondary | ICD-10-CM | POA: Diagnosis not present

## 2020-05-10 DIAGNOSIS — R7301 Impaired fasting glucose: Secondary | ICD-10-CM | POA: Diagnosis not present

## 2020-05-10 DIAGNOSIS — I2510 Atherosclerotic heart disease of native coronary artery without angina pectoris: Secondary | ICD-10-CM | POA: Diagnosis not present

## 2020-05-10 DIAGNOSIS — E039 Hypothyroidism, unspecified: Secondary | ICD-10-CM | POA: Diagnosis not present

## 2020-05-10 DIAGNOSIS — F17209 Nicotine dependence, unspecified, with unspecified nicotine-induced disorders: Secondary | ICD-10-CM | POA: Diagnosis not present

## 2020-05-10 DIAGNOSIS — Z23 Encounter for immunization: Secondary | ICD-10-CM | POA: Diagnosis not present

## 2020-05-12 DIAGNOSIS — M25511 Pain in right shoulder: Secondary | ICD-10-CM | POA: Diagnosis not present

## 2020-06-28 DIAGNOSIS — Z23 Encounter for immunization: Secondary | ICD-10-CM | POA: Diagnosis not present

## 2020-07-09 DIAGNOSIS — N1831 Chronic kidney disease, stage 3a: Secondary | ICD-10-CM | POA: Diagnosis not present

## 2020-07-09 DIAGNOSIS — I5032 Chronic diastolic (congestive) heart failure: Secondary | ICD-10-CM | POA: Diagnosis not present

## 2020-07-09 DIAGNOSIS — I701 Atherosclerosis of renal artery: Secondary | ICD-10-CM | POA: Diagnosis not present

## 2020-07-09 DIAGNOSIS — I129 Hypertensive chronic kidney disease with stage 1 through stage 4 chronic kidney disease, or unspecified chronic kidney disease: Secondary | ICD-10-CM | POA: Diagnosis not present

## 2020-07-11 ENCOUNTER — Other Ambulatory Visit: Payer: Self-pay | Admitting: Cardiology

## 2020-07-29 DIAGNOSIS — Z01419 Encounter for gynecological examination (general) (routine) without abnormal findings: Secondary | ICD-10-CM | POA: Diagnosis not present

## 2020-07-29 DIAGNOSIS — Z6832 Body mass index (BMI) 32.0-32.9, adult: Secondary | ICD-10-CM | POA: Diagnosis not present

## 2020-08-10 DIAGNOSIS — J069 Acute upper respiratory infection, unspecified: Secondary | ICD-10-CM | POA: Diagnosis not present

## 2020-08-10 DIAGNOSIS — R059 Cough, unspecified: Secondary | ICD-10-CM | POA: Diagnosis not present

## 2020-08-10 DIAGNOSIS — R062 Wheezing: Secondary | ICD-10-CM | POA: Diagnosis not present

## 2020-08-10 DIAGNOSIS — Z1152 Encounter for screening for COVID-19: Secondary | ICD-10-CM | POA: Diagnosis not present

## 2020-08-12 DIAGNOSIS — N840 Polyp of corpus uteri: Secondary | ICD-10-CM | POA: Diagnosis not present

## 2020-08-12 DIAGNOSIS — N930 Postcoital and contact bleeding: Secondary | ICD-10-CM | POA: Diagnosis not present

## 2020-08-12 DIAGNOSIS — N950 Postmenopausal bleeding: Secondary | ICD-10-CM | POA: Diagnosis not present

## 2020-09-01 ENCOUNTER — Ambulatory Visit (INDEPENDENT_AMBULATORY_CARE_PROVIDER_SITE_OTHER): Payer: Medicare Other | Admitting: Nurse Practitioner

## 2020-09-01 ENCOUNTER — Encounter: Payer: Self-pay | Admitting: Nurse Practitioner

## 2020-09-01 VITALS — BP 118/72 | HR 74 | Ht 67.0 in | Wt 198.2 lb

## 2020-09-01 DIAGNOSIS — R140 Abdominal distension (gaseous): Secondary | ICD-10-CM

## 2020-09-01 DIAGNOSIS — K219 Gastro-esophageal reflux disease without esophagitis: Secondary | ICD-10-CM | POA: Diagnosis not present

## 2020-09-01 DIAGNOSIS — Z86010 Personal history of colonic polyps: Secondary | ICD-10-CM | POA: Diagnosis not present

## 2020-09-01 DIAGNOSIS — K5900 Constipation, unspecified: Secondary | ICD-10-CM

## 2020-09-01 NOTE — Patient Instructions (Signed)
If you are age 72 or older, your body mass index should be between 23-30. Your Body mass index is 31.04 kg/m. If this is out of the aforementioned range listed, please consider follow up with your Primary Care Provider.  If you are age 31 or younger, your body mass index should be between 19-25. Your Body mass index is 31.04 kg/m. If this is out of the aformentioned range listed, please consider follow up with your Primary Care Provider.    Please take Miralax. Dissolve one capful in 8 ounces of water and drink before bed.  Increase Tagamet to twice a day if ok by your nephrologist.   We will request a copy of your recent lab results from your primary care provider.  You have been scheduled for an endoscopy. Please follow written instructions given to you at your visit today. If you use inhalers (even only as needed), please bring them with you on the day of your procedure.  It was great seeing you today!  Thank you for entrusting me with your care and choosing [facility].  Alcide Evener, NP

## 2020-09-01 NOTE — Progress Notes (Signed)
09/01/2020 Katherine KeensDale Ramakrishnan [id number] 02/08/1951   Chief Complaint: dysphagia, abdominal bloat and constipation   History of Present Illness: Katherine Greer is a 70 year old female with a past medical history of asthma, hyperlipidemia, hypothyroidism,  Right renal artery stenosis s/p right renal stent placement by Dr. Sherald Hesshristopher Clark 04/30/2019, GERD and colon polyps.  She was initially evaluated by Dr. Meridee ScoreMansouraty on 05/02/2018 due to having chronic GERD and dysphagia and she was due for a colonoscopy.  At that time, she was on Prilosec and her reflux symptoms were fairly well controlled she primarily had intermittent pill dysphagia.  An EGD was not recommended but there was discussion for possible ENT speech pathology evaluation.  She underwent a colonoscopy 07/03/2018 and 6  polyps were removed from the rectosigmoid colon, ascending colon and cecum.  One 8 mm polyp was removed from the rectosigmoid colon.  Biopsies identified tubular adenomatous and hyperplastic polyps.  No dysplasia.  Multiple small diverticular were present to the rectosigmoid, sigmoid and descending colon.  Repeat colonoscopy in 3 years was recommended.  She presents to our office today to further discuss her reflux symptoms, dysphagia, constipation and abdominal bloat.  She reports having intermittent heartburn which occurs several days weekly and she continues to have pill dysphagia.  She is concerned regarding any esophageal changes which may have occurred due to chronic GERD symptoms.  No upper abdominal pain. She reported completing an EGD possibly 10 years ago which diagnosed GERD.  She underwent a renal stent placement 04/30/2019. She was on Plavix post renal stent placement for a few months then transitioned to ASA 81mg  daily.  Her nephrologist or vascular specialist told her not to take any PPIs. She stated she was allowed to take Tagamet and Tums only. She complains of having abdominal bloat which is persisted for 3 to 5 years.   She has chronic constipation.  She passes a bowel movement most days but if she skips 1 day she develops worsening abdominal bloat.  However, for the past 2 weeks or so she is having a fairly consistent normal formed bowel movement and her abdominal bloat is much less.  No significant abdominal pain.  No rectal bleeding or melena.  Takes Colace or Citrucel once monthly.  Fiber supplements tend to cause more bloat.  Colonoscopy 07/03/2018:  - Skin tags were found on perianal exam. - The digital rectal exam was abnormal. Pertinent negatives include no palpable rectal lesions. - The terminal ileum and ileocecal valve appeared normal. - Six sessile polyps were found in the recto-sigmoid colon, ascending colon and cecum. The polyps were 1 to 6 mm in size. These polyps were removed with a cold snare. Resection and retrieval were complete. - A 8 mm polyp was found in the recto-sigmoid colon. The polyp was sessile. The polyp was removed with a hot snare. Resection and retrieval were complete. - Multiple small-mouthed diverticula were found in the recto-sigmoid colon, sigmoid colon and descending colon. -3 year recall Surgical [P], cecal, ascending, rectosigmoid, polyp (7) - TUBULAR ADENOMA(S). - HYPERPLASTIC POLYP(S) (TWO FRAGMENTS) - HIGH GRADE DYSPLASIA IS NOT IDENTIFIED.  [place] 5 polyps removed via forceps or hot snare and pathology reviewed suggested that she had multiple tubular adenomas as well as multiple sessile serrated polyps.    Current Outpatient Medications on File Prior to Visit  Medication Sig Dispense Refill  . acetaminophen (TYLENOL) 500 MG tablet Take 1,000 mg by mouth every 6 (six) hours as needed for mild pain or  moderate pain.     Marland Kitchen aspirin EC 81 MG tablet Take 81 mg by mouth daily. Swallow whole.    Marland Kitchen atorvastatin (LIPITOR) 40 MG tablet Take 1 tablet (40 mg total) by mouth at bedtime. 90 tablet 3  . Cholecalciferol 2000 units CAPS Take  2,000 Units by mouth at bedtime.     . cimetidine (TAGAMET) 200 MG tablet Take 200 mg by mouth daily as needed (acid reflux).    . fluticasone (FLONASE) 50 MCG/ACT nasal spray Place 1 spray into both nostrils daily as needed for allergies or rhinitis.    . furosemide (LASIX) 40 MG tablet Take 40 mg by mouth daily.    Marland Kitchen gabapentin (NEURONTIN) 100 MG capsule Take 100 mg by mouth 2 (two) times daily.    . halobetasol (ULTRAVATE) 0.05 % cream Apply 1 application topically 2 (two) times daily as needed (dermatitis).     . ibandronate (BONIVA) 150 MG tablet Take 150 mg by mouth every 30 (thirty) days.    Marland Kitchen levothyroxine (SYNTHROID, LEVOTHROID) 112 MCG tablet Take 112 mcg by mouth daily before breakfast.    . nicotine polacrilex (COMMIT) 4 MG lozenge Take 4 mg by mouth as needed for smoking cessation.    Marland Kitchen spironolactone (ALDACTONE) 25 MG tablet TAKE 1 TABLET(25 MG) BY MOUTH TWICE DAILY 180 tablet 3  . tolterodine (DETROL LA) 4 MG 24 hr capsule Take 4 mg by mouth daily.    Marland Kitchen triamcinolone cream (KENALOG) 0.1 % Apply 1 application topically 2 (two) times daily as needed (dermatitis).     No current facility-administered medications on file prior to visit.   Allergies  Allergen Reactions  . Augmentin [Amoxicillin-Pot Clavulanate] Anaphylaxis and Rash    Did it involve swelling of the face/tongue/throat, SOB, or low BP? Yes Did it involve sudden or severe rash/hives, skin peeling, or any reaction on the inside of your mouth or nose? Yes Did you need to seek medical attention at a hospital or doctor's office? No When did it last happen?18 years If all above answers are "NO", may proceed with cephalosporin use.   Marland Kitchen Keflex [Cephalexin] Anaphylaxis  . Levaquin [Levofloxacin In D5w] Anaphylaxis  . Sulfa Antibiotics Anaphylaxis and Rash  . Carvedilol Other (See Comments)    Pt reports carvedilol causes her hair loss.   . Cephalosporins     Other reaction(s): Other (See Comments) NO REACTION  STATED.  . Contrast Media [Iodinated Diagnostic Agents]     Lowered kidney function   . Depakote Er [Divalproex Sodium Er] Nausea Only  . Latex Itching  . Nickel Hives    Topical dermatitis sees Dr. Karlyn Agee   . Nsaids     Avoid due to kidney function   . Tobramycin Swelling    redness  . Topamax [Topiramate]     unknown  . Tramadol     Per pt this causes flushing and facial swelling    Current Medications, Allergies, Past Medical History, Past Surgical History, Family History and Social History were reviewed in [employer] record.   Review of Systems:   Constitutional: Negative for fever, sweats, chills or weight loss.  Respiratory: Negative for shortness of breath.   Cardiovascular: Negative for chest pain, palpitations and leg swelling.  Gastrointestinal: See HPI.  Musculoskeletal: Negative for back pain or muscle aches.  Neurological: Negative for dizziness, headaches or paresthesias.    Physical Exam: BP 118/72   Pulse 74   Ht 5\' 7"  (1.702 m)   Wt  198 lb 3.2 oz (89.9 kg)   SpO2 98%   BMI 31.04 kg/m  General: Well developed 70 year old female in no acute distress. Head: Normocephalic and atraumatic. Eyes: No scleral icterus. Conjunctiva pink . Ears: Normal auditory acuity. Mouth: Dentition intact. No ulcers or lesions.  Lungs: Clear throughout to auscultation. Heart: Regular rate and rhythm, no murmur. Abdomen: Soft, nontender and nondistended. No masses or hepatomegaly. Normal bowel sounds x 4 quadrants.  Rectal: Deferred. Musculoskeletal: Symmetrical with no gross deformities. Extremities: No edema. Neurological: Alert oriented x 4. No focal deficits.  Psychological: Alert and cooperative. Normal mood and affect  Assessment and Recommendations:  41.  70 year old female with chronic GERD and pill dysphagia. -EGD benefits and risks discussed including risk with sedation, risk of bleeding, perforation and infection  -Patient to contact  her nephrologist to verify if she can increase Tagamet 200mg  po to twice daily -Request copy of CBC and CMP done by her PCP within the past 2 months -Discussed GERD diet and weight loss to reduce her GERD symptoms  2.  Constipation -MiraLAX 1 capful mixed in 8 ounces of water daily  3.  Abdominal bloat, most likely due to constipation -See plan in #2 -If abdominal bloat persists and constipation resolves will consider SIBO breath test to rule out small intestinal bacterial overgrowth  4.  History of right renal artery stenosis status post right renal stent placement.  No longer on Plavix.  She remains on ASA 81 mg daily.  5.  History of tubular adenomatous and hyperplastic polyp -Next colonoscopy due November 2022  Further follow-up to be determined after the above evaluation completed

## 2020-09-01 NOTE — Progress Notes (Signed)
Attending Physician's Attestation   I have reviewed the chart.   I agree with the Advanced Practitioner's note, impression, and recommendations with any updates as below. EGD with Empiric dilation if necessary will be performed.   Corliss Parish, MD [facility] Advanced Endoscopy Office # [phone]

## 2020-09-06 IMAGING — MR MR SHOULDER*R* W/O CM
5 series · 35 of 40 positions shown · non-contrast
Comparison: None.

CLINICAL DATA: Right shoulder pain for several months which no
shoulder surgery

EXAM:
MRI OF THE RIGHT SHOULDER WITHOUT CONTRAST
TECHNIQUE: Multiplanar, multisequence MR imaging of the shoulder was performed.
No intravenous contrast was administered.

[Series 3: PD fat-sat · axial · 4.0mm · 0.55mm/px · z∈[-44,+53]mm · 8 of 22 slices shown (1 of 2)]
[im 1/22]
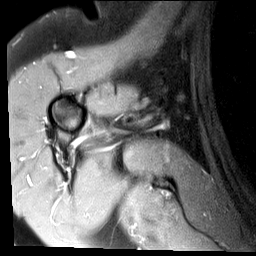
[im 4/22]
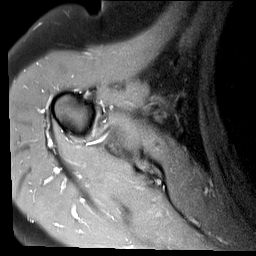
[im 7/22]
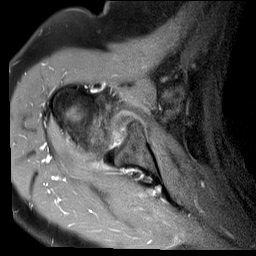
[im 10/22]
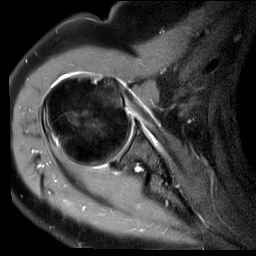
[im 13/22]
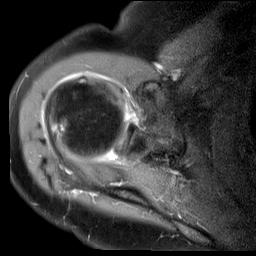
[im 16/22]
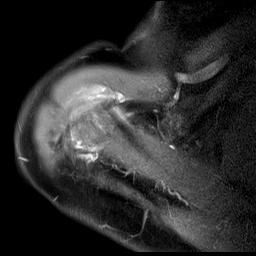
[im 19/22]
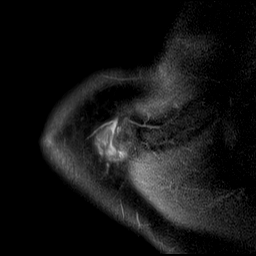
[im 22/22]
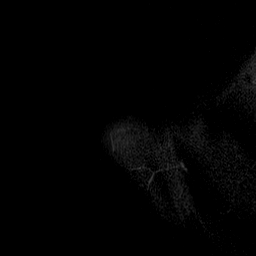

[Series 4: T1 · oblique · 4.0mm · 0.31mm/px · 4 of 22 slices shown]
[im 1/22]
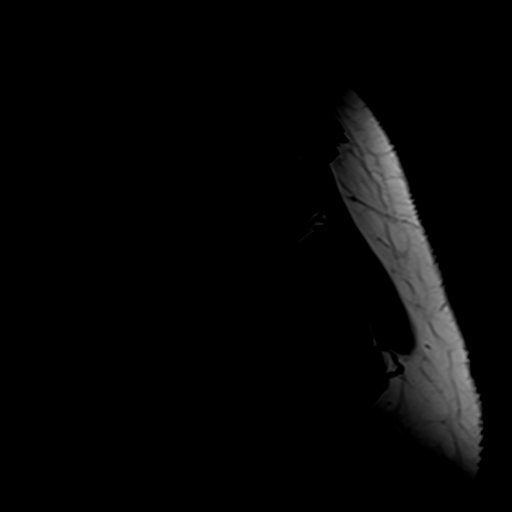
[im 3/22]
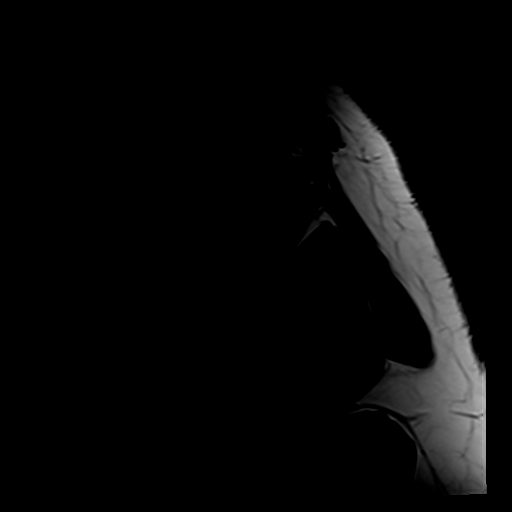
[im 6/22]
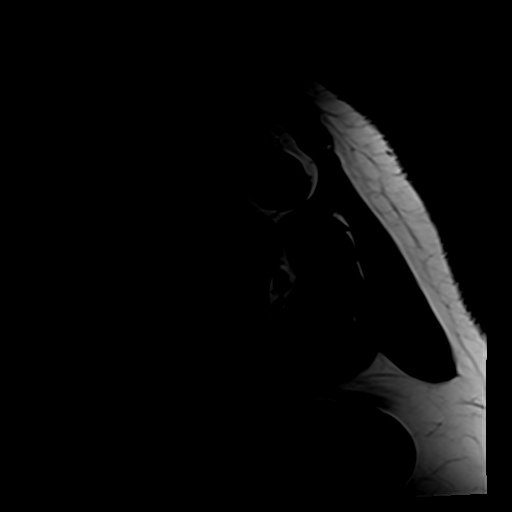
[im 8/22]
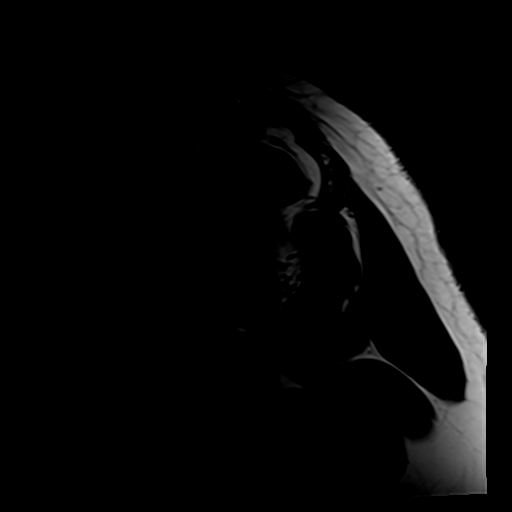

[Series 5: T2 fat-sat · oblique · 4.0mm · 0.62mm/px · 9 of 22 slices shown (1 of 2)]
[im 1/22]
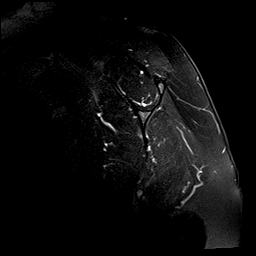
[im 3/22]
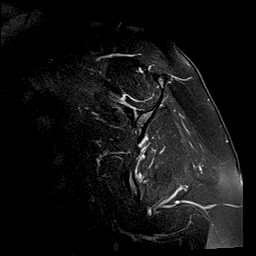
[im 6/22]
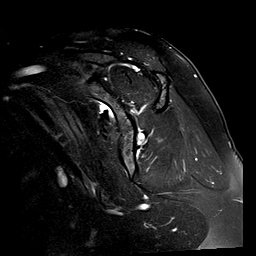
[im 8/22]
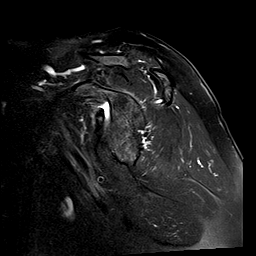
[im 11/22]
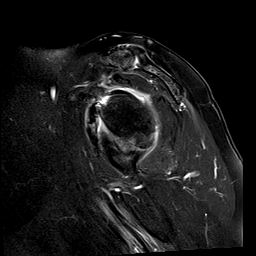
[im 14/22]
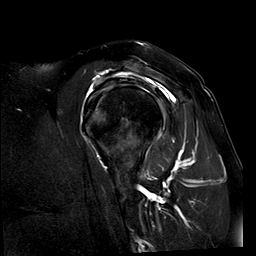
[im 16/22]
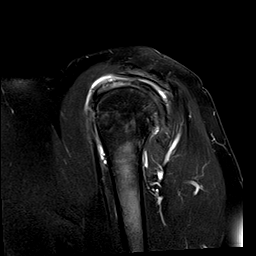
[im 19/22]
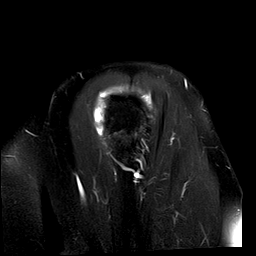
[im 22/22]
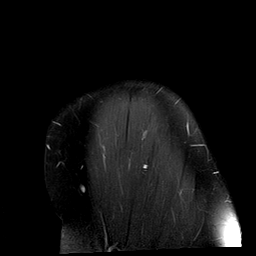

[Series 6: T2 fat-sat · oblique · 4.0mm · 0.62mm/px · 7 of 16 slices shown (2 of 2)]
[im 1/16]
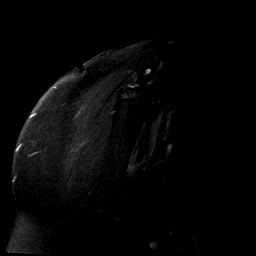
[im 3/16]
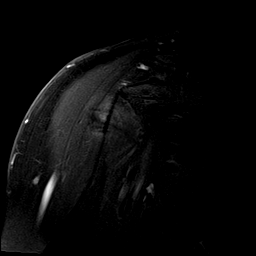
[im 6/16]
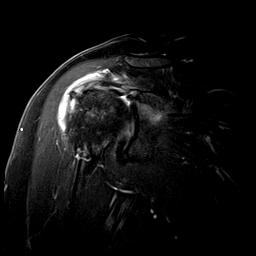
[im 8/16]
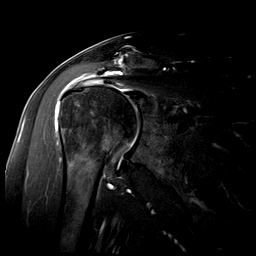
[im 11/16]
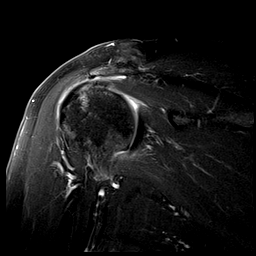
[im 13/16]
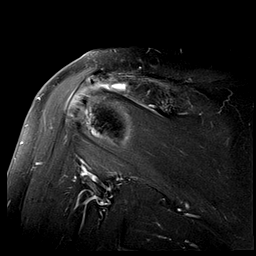
[im 16/16]
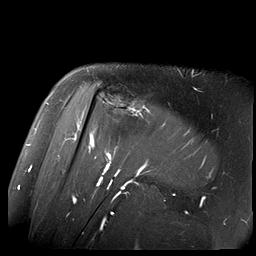

[Series 7: PD fat-sat · oblique · 4.0mm · 0.31mm/px · 7 of 16 slices shown (2 of 2)]
[im 1/16]
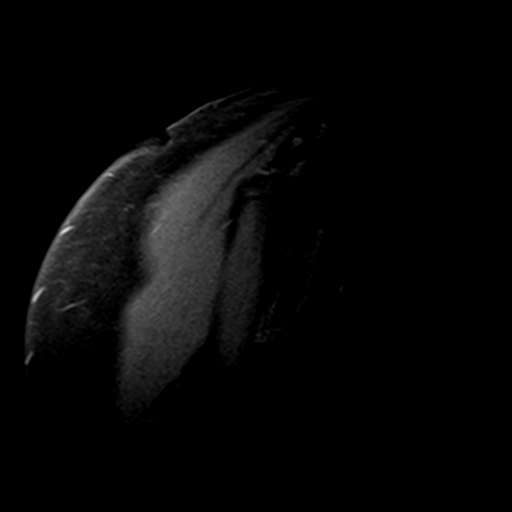
[im 3/16]
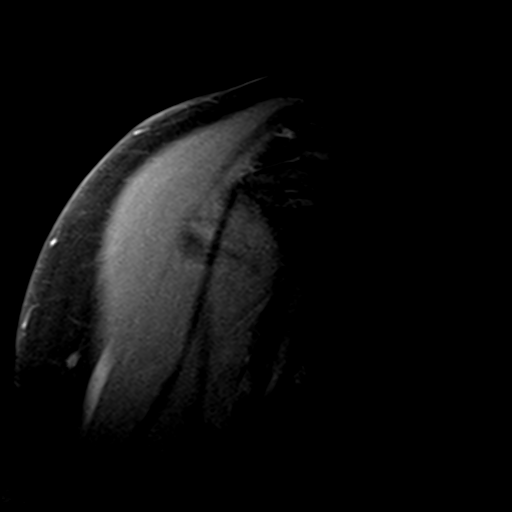
[im 6/16]
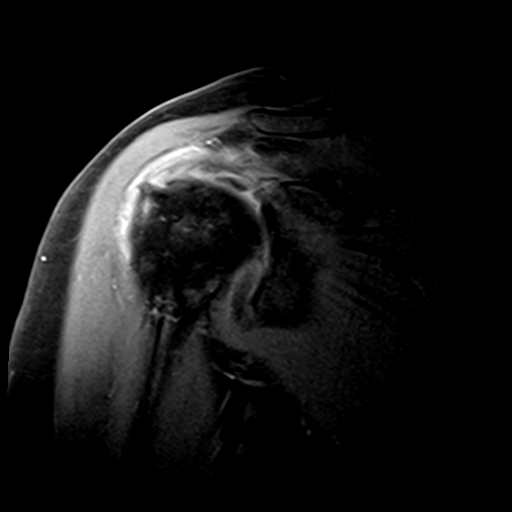
[im 8/16]
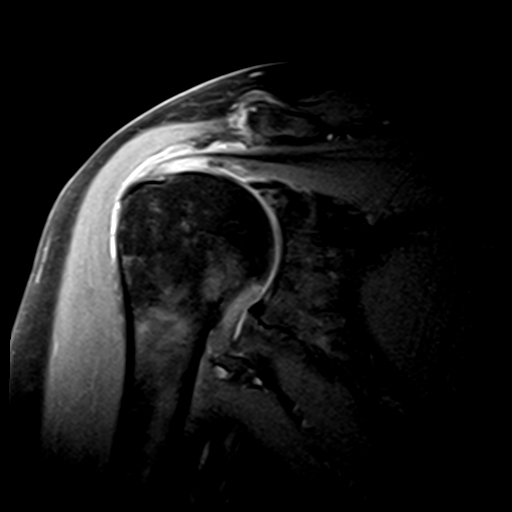
[im 11/16]
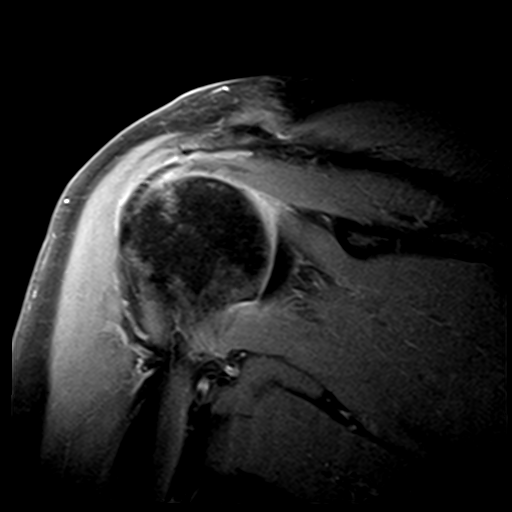
[im 13/16]
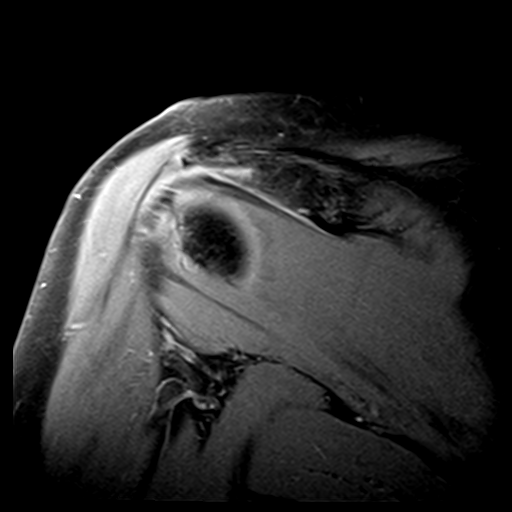
[im 16/16]
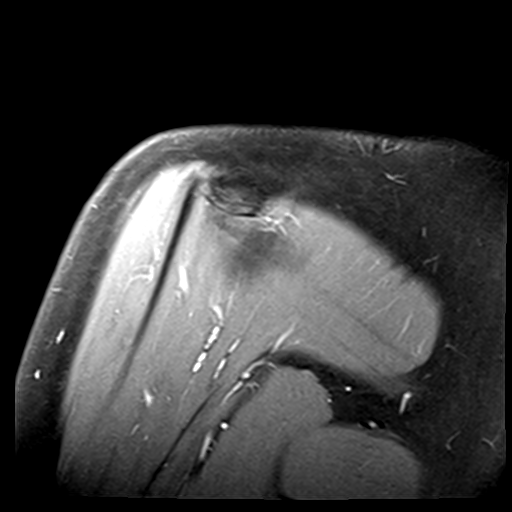

[35 of 40 positions shown; findings below may reference images not displayed]

FINDINGS: Rotator cuff: There is low-grade partial bursal surface tear of the
superior subscapularis tendon at the insertion site. There is also
low-grade partial bursal surface tearing of the anterior
supraspinatus tendon. No full-thickness rotator cuff tears are seen.
The muscles of the rotator cuff are normal without tear, edema, or
atrophy.

Muscles: The muscles other than the rotator cuff are normal without
tear, edema, or atrophy.

Biceps Long Head: There is increased signal seen in the
intra-articular portion of the long head of the biceps tendon.

Acromioclavicular Joint: Moderate AC joint arthrosis seen with joint
space loss and capsular hypertrophy. Type II acromion.

Glenohumeral Joint: The glenohumeral joint alignment is well
maintained. A trace glenohumeral joint effusion is seen. No focal
chondral defects are noted. There is mild chondral thinning at the
glenohumeral articulation.

Labrum: There is degeneration the posterosuperior labrum.

Bones: No fracture, osteonecrosis, or pathologic marrow
infiltration.

Other: A small amount of subacromial-subdeltoid bursal fluid is
seen.
IMPRESSION: Low-grade partial bursal surface fraying/tear at the superior
subscapularis and anterior supraspinatus tendon. No full-thickness
rotator cuff tears.

Mild glenohumeral joint chondral disease and posterosuperior labral
degeneration.

Moderate AC joint arthrosis and subacromial-subdeltoid bursitis.

Trace glenohumeral joint effusion.

## 2020-09-07 ENCOUNTER — Encounter: Payer: Self-pay | Admitting: Gastroenterology

## 2020-09-07 ENCOUNTER — Other Ambulatory Visit: Payer: Self-pay

## 2020-09-07 ENCOUNTER — Ambulatory Visit (AMBULATORY_SURGERY_CENTER): Payer: Medicare Other | Admitting: Gastroenterology

## 2020-09-07 VITALS — BP 101/53 | HR 58 | Temp 97.4°F | Resp 17 | Ht 67.0 in | Wt 198.0 lb

## 2020-09-07 DIAGNOSIS — K2980 Duodenitis without bleeding: Secondary | ICD-10-CM | POA: Diagnosis not present

## 2020-09-07 DIAGNOSIS — R1310 Dysphagia, unspecified: Secondary | ICD-10-CM | POA: Diagnosis not present

## 2020-09-07 DIAGNOSIS — K2990 Gastroduodenitis, unspecified, without bleeding: Secondary | ICD-10-CM

## 2020-09-07 DIAGNOSIS — R140 Abdominal distension (gaseous): Secondary | ICD-10-CM | POA: Diagnosis not present

## 2020-09-07 DIAGNOSIS — K2970 Gastritis, unspecified, without bleeding: Secondary | ICD-10-CM

## 2020-09-07 DIAGNOSIS — K259 Gastric ulcer, unspecified as acute or chronic, without hemorrhage or perforation: Secondary | ICD-10-CM | POA: Diagnosis not present

## 2020-09-07 DIAGNOSIS — N183 Chronic kidney disease, stage 3 unspecified: Secondary | ICD-10-CM | POA: Diagnosis not present

## 2020-09-07 DIAGNOSIS — K2289 Other specified disease of esophagus: Secondary | ICD-10-CM | POA: Diagnosis not present

## 2020-09-07 DIAGNOSIS — K219 Gastro-esophageal reflux disease without esophagitis: Secondary | ICD-10-CM

## 2020-09-07 DIAGNOSIS — K209 Esophagitis, unspecified without bleeding: Secondary | ICD-10-CM

## 2020-09-07 DIAGNOSIS — Z86010 Personal history of colonic polyps: Secondary | ICD-10-CM | POA: Diagnosis not present

## 2020-09-07 MED ORDER — SUCRALFATE 1 GM/10ML PO SUSP: 1.0000 g | Freq: Four times a day (QID) | ORAL | 3 refills | Status: DC

## 2020-09-07 MED ORDER — FAMOTIDINE 20 MG PO TABS: 20.0000 mg | ORAL_TABLET | Freq: Two times a day (BID) | ORAL | 3 refills | Status: DC

## 2020-09-07 MED ORDER — SODIUM CHLORIDE 0.9 % IV SOLN: 500.0000 mL | Freq: Once | INTRAVENOUS | Status: AC

## 2020-09-07 NOTE — Progress Notes (Signed)
Report to PACU, RN, vss, BBS= Clear.  

## 2020-09-07 NOTE — Progress Notes (Signed)
Called to room to assist during endoscopic procedure.  Patient ID and intended procedure confirmed with present staff. Received instructions for my participation in the procedure from the performing physician.  

## 2020-09-07 NOTE — Patient Instructions (Addendum)
YOU HAD AN ENDOSCOPIC PROCEDURE TODAY AT [facility]:   Refer to the procedure report that was given to you for any specific questions about what was found during the examination.  If the procedure report does not answer your questions, please call your gastroenterologist to clarify.  If you requested that your care partner not be given the details of your procedure findings, then the procedure report has been included in a sealed envelope for you to review at your convenience later.  YOU SHOULD EXPECT: Some feelings of bloating in the abdomen. Passage of more gas than usual.  Walking can help get rid of the air that was put into your GI tract during the procedure and reduce the bloating. If you had a lower endoscopy (such as a colonoscopy or flexible sigmoidoscopy) you may notice spotting of blood in your stool or on the toilet paper. If you underwent a bowel prep for your procedure, you may not have a normal bowel movement for a few days.  Please Note:  You might notice some irritation and congestion in your nose or some drainage.  This is from the oxygen used during your procedure.  There is no need for concern and it should clear up in a day or so.  SYMPTOMS TO REPORT IMMEDIATELY:    Following upper endoscopy (EGD)  Vomiting of blood or coffee ground material  New chest pain or pain under the shoulder blades  Painful or persistently difficult swallowing  New shortness of breath  Fever of 100F or higher  Black, tarry-looking stools  For urgent or emergent issues, a gastroenterologist can be reached at any hour by calling [phone]. Do not use MyChart messaging for urgent concerns.    DIET:  Follow a Post Dilation Diet (see handout given to you by your recovery nurse): Nothing by mouth until 12:30pm, Clear Liquids Only from 12:30 to 1:30pm, then you may have a soft diet for the rest of the day today starting at 1:30pm. Tomorrow morning you may proceed to your regular  diet as tolerated.  Drink plenty of fluids but you should avoid alcoholic beverages for 24 hours.  MEDICATIONS: Continue present medications. Start Pepcid 20 mg by mouth twice daily. Start Carafate 1 Gram 2-4 times daily for now (liquid preferred over pills if possible).  FOLLOW UP: Repeat upper endoscopy in 3 months to check healing, schedule not out that far, Dr. Donneta Romberg office will call you to schedule this.  Please see handouts given to you by your recovery nurse.  ACTIVITY:  You should plan to take it easy for the rest of today and you should NOT DRIVE or use heavy machinery until tomorrow (because of the sedation medicines used during the test).    FOLLOW UP: Our staff will call the number listed on your records 48-72 hours following your procedure to check on you and address any questions or concerns that you may have regarding the information given to you following your procedure. If we do not reach you, we will leave a message.  We will attempt to reach you two times.  During this call, we will ask if you have developed any symptoms of COVID 19. If you develop any symptoms (ie: fever, flu-like symptoms, shortness of breath, cough etc.) before then, please call [phone].  If you test positive for Covid 19 in the 2 weeks post procedure, please call and report this information to [place].    If any biopsies were taken you will be  contacted by phone or by letter within the next 1-3 weeks.  Please call us at [phone] if you have not heard about the biopsies in 3 weeks.   Thank you for allowing [place] to provide for your healthcare needs today.   SIGNATURES/CONFIDENTIALITY: You and/or your care partner have signed paperwork which will be entered into your electronic medical record.  These signatures attest to the fact that that the information above on your After Visit Summary has been reviewed and is understood.  Full responsibility of the confidentiality of this discharge information  lies with you and/or your care-partner.

## 2020-09-07 NOTE — Op Note (Signed)
[place] Patient Name: Katherine Greer Procedure Date: 09/07/2020 10:47 AM MRN: [id number] Endoscopist: Justice Britain , MD Age: 70 Referring MD:  Date of Birth: [birth year] Gender: Female Account #: [id number] Procedure:                Upper GI endoscopy Indications:              Dysphagia, Gastro-esophageal reflux disease,                            Abdominal bloating Medicines:                Monitored Anesthesia Care Procedure:                Pre-Anesthesia Assessment:                           - Prior to the procedure, a History and Physical                            was performed, and patient medications and                            allergies were reviewed. The patient's tolerance of                            previous anesthesia was also reviewed. The risks                            and benefits of the procedure and the sedation                            options and risks were discussed with the patient.                            All questions were answered, and informed consent                            was obtained. Prior Anticoagulants: The patient has                            taken no previous anticoagulant or antiplatelet                            agents except for aspirin. ASA Grade Assessment:                            III - A patient with severe systemic disease. After                            reviewing the risks and benefits, the patient was                            deemed in satisfactory condition to undergo the  procedure.                           After obtaining informed consent, the endoscope was                            passed under direct vision. Throughout the                            procedure, the patient's blood pressure, pulse, and                            oxygen saturations were monitored continuously. The                            Endoscope was introduced through the mouth, and                             advanced to the second part of duodenum. The upper                            GI endoscopy was accomplished without difficulty.                            The patient tolerated the procedure. Scope In: Scope Out: Findings:                 No gross lesions were noted in the proximal                            esophagus and in the mid esophagus.                           LA Grade B (one or more mucosal breaks greater than                            5 mm, not extending between the tops of two mucosal                            folds) esophagitis with no bleeding was found in                            the distal esophagus.                           The Z-line was irregular and was found 39 cm from                            the incisors.                           After the rest of the EGD was complete, a guidewire                            was placed  and the scope was withdrawn. Dilation                            was performed in the entire esophagus with a Savary                            dilator with mild resistance at 18 mm. The                            esophagus was examined following endoscope                            reinsertion and showed mild mucosal disruption and                            mild improvement in luminal narrowing just below                            the UES with a noted mucosal wrent and no                            perforation.                           2-3 cm sliding hiatal hernia noted.                           Patchy moderate inflammation characterized by                            erosions, erythema, friability and granularity was                            found in the entire examined stomach.                           Two non-bleeding linear gastric ulcers with a clean                            ulcer base (Forrest Class III) were found in the                            gastric antrum. The largest lesion was 10 mm in                             largest dimension.                           No other gross lesions were noted in the entire                            examined stomach. Biopsies were taken with a cold  forceps for histology and Helicobacter pylori                            testing.                           No gross lesions were noted in the duodenal bulb,                            in the first portion of the duodenum and in the                            second portion of the duodenum. Biopsies for                            histology were taken with a cold forceps for                            evaluation of celiac disease. Complications:            No immediate complications. Estimated Blood Loss:     Estimated blood loss was minimal. Impression:               - No gross lesions in esophagus proximally. LA                            Grade B esophagitis with no bleeding distally.                           - Z-line irregular, 39 cm from the incisors.                           - Dilation performed in the entire esophagus with                            evidence of mucosal wrent at the UES.                           - Hiatal hernia noted (2-3 cm sliding).                           - Gastritis. Non-bleeding gastric ulcers with a                            clean ulcer base (Forrest Class III). No other                            gross lesions in the stomach. Biopsied.                           - No gross lesions in the duodenal bulb, in the                            first portion of the duodenum and in the second  portion of the duodenum. Biopsied. Recommendation:           - The patient will be observed post-procedure,                            until all discharge criteria are met.                           - Discharge patient to home.                           - Patient has a contact number available for                            emergencies. The signs and  symptoms of potential                            delayed complications were discussed with the                            patient. Return to normal activities tomorrow.                            Written discharge instructions were provided to the                            patient.                           - Dilation diet as per protocol.                           - Continue present medications.                           - Recommend starting Omeprazole 40 mg twice daily                            in effort of healing her                            esophagitis/gastritis/gastric ulcers (though based                            on notation it is not clear she feels comfortable                            with this based on prior recommendations from                            surgery and neprhology) thus will need to discuss                            this further with her providers.                           -  Start Pepcid 20 mg twice daily.                           - Start Carafate 1 g 2-4 times daily for now                            (liquid preferred over pills if possible).                           - Repeat upper endoscopy in 3 months to check                            healing.                           - The findings and recommendations were discussed                            with the patient.                           - The findings and recommendations were discussed                            with the patient's family. Justice Britain, MD 09/07/2020 11:28:11 AM

## 2020-09-08 ENCOUNTER — Telehealth: Payer: Self-pay | Admitting: Gastroenterology

## 2020-09-08 NOTE — Telephone Encounter (Signed)
I did a prior authorization for the carafate suspension thru [employer] and got it approved. I called to tell Katherine Greer and she wanted to know if Dr Rush Landmark had talked with Dr Monica Martinez and Dr Joylene Draft in regards to her taking the pepcid and carafate with her low kidney function. She said to tell Dr Rush Landmark that she has started the pepcid and just picked up the carafate. Please advise Sir, thank you.

## 2020-09-08 NOTE — Telephone Encounter (Signed)
Pt states her pharmacy does not have the [place] prescription, pt would like to know if this could be called in.

## 2020-09-09 ENCOUNTER — Telehealth: Payer: Self-pay

## 2020-09-09 NOTE — Telephone Encounter (Signed)
Inbound call from patient stating she asked the pharmacist in regards to the medication and they advised her to take 1 20mg  tablet or 2 10mg  tablet of the pepcid.  Wants to know if that is ok?  Please advise.

## 2020-09-09 NOTE — Telephone Encounter (Signed)
  Follow up Call-  Call back number 09/07/2020 07/03/2018  Post procedure Call Back phone  # [phone]  Permission to leave phone message Yes Yes  Some recent data might be hidden     Patient questions:  Do you have a fever, pain , or abdominal swelling? No. Pain Score  0 *  Have you tolerated food without any problems? Yes.    Have you been able to return to your normal activities? Yes.    Do you have any questions about your discharge instructions: Diet   No. Medications  No. Follow up visit  No.  Do you have questions or concerns about your Care? No.  Actions: * If pain score is 4 or above: 1. No action needed, pain <4.Have you developed a fever since your procedure? no  2.   Have you had an respiratory symptoms (SOB or cough) since your procedure? no  3.   Have you tested positive for COVID 19 since your procedure no  4.   Have you had any family members/close contacts diagnosed with the COVID 19 since your procedure?  no   If yes to any of these questions please route to Joylene John, RN and Joella Prince, RN

## 2020-09-09 NOTE — Telephone Encounter (Signed)
We are following patient for renal artery stenosis, she is s/p r renal stent placement in 2020. She has recently seen GI doctor and has gastric ulcers. MD prescribed Carafate and Pepcid. Patient wanted to know if that would impact her renal function. Discussed with CJC, okay to take from his standpoint. Patient aware.

## 2020-09-11 ENCOUNTER — Encounter: Payer: Self-pay | Admitting: Gastroenterology

## 2020-09-11 NOTE — Telephone Encounter (Signed)
It is okay. Thank you. GM

## 2020-09-13 ENCOUNTER — Telehealth: Payer: Self-pay

## 2020-09-13 NOTE — Telephone Encounter (Signed)
-----   Message from Irving Copas., MD sent at 09/13/2020 10:29 AM EST ----- Regarding: RE: [facility] Dr. Carlis Abbott and Dr. Joylene Draft,  Thank you both for your reply. I'll have my team reach out to the patient and we will initiate PPI therapy moderately today. I will have my RN reach out to your office to work on getting the labs at your office scheduled. Thanks to all.  Lylah Lantis, please reach out to patient and let her know that I have discussed case with Dr. Carlis Abbott and Dr. Joylene Draft and we are OK with PPI initiation for now. Let us start Omeprazole 40 mg daily for now. She needs to call Dr. Silvestre Mesi office and let them know the date she initiates the medication as she will need to follow up in his office for labs 1 month after initiation. Thank you. GM ----- Message ----- From: Crist Infante, MD Sent: 09/12/2020   6:30 PM EST To: Irving Copas., MD Subject: RE: [facility]                             Dr.Mansouraty. In this setting I have very little concern that the PPI therapy will worsen her renal function.  So, please initiate therapy as you see appropriate to do so. She can be directed to come to our lab one month after starting any new medicine for CBC-diff and CMET to assess the status of her renal function.   Let me know if you have any questions.  Crist Infante, [employer], [place]  ----- Message ----- From: Irving Copas., MD Sent: 09/11/2020   7:17 AM EST To: Crist Infante, MD, Marty Heck, MD Subject: [facility] patient                                 Dr. Carlis Abbott and Dr. Joylene Draft, I hope you are both well.  This patient has evidence of acid reflux esophagitis as well as gastric ulcers and gastritis.  I strongly would recommend we initiate her on some PPI therapy but she is adamant that neither of you would want her on that.  Certainly the concern of development of potential renal insufficiency with long-term use of PPIs is being better understood and  may not be as strong of an issue as it was once thought but I would like to try and get her on something more than just famotidine.  If you all are okay I will willing to get her started on just once daily PPI moderate dosing of 40 mg and eventually get her down to 20 mg but I need to heal the stomach lining.  Appreciate your thoughts from both of you as the patient is hesitant to initiate any other medications unless you are give the blessing/okay. Appreciate it. Katherine Greer

## 2020-09-14 MED ORDER — OMEPRAZOLE 40 MG PO CPDR: 40.0000 mg | DELAYED_RELEASE_CAPSULE | Freq: Every day | ORAL | 3 refills | Status: DC

## 2020-09-14 NOTE — Telephone Encounter (Signed)
The pt has been advised and will start the omeprazole as prescribed. I have sent the prescription.  She will call Dr Perini's office to discuss and followup.

## 2020-09-14 NOTE — Telephone Encounter (Signed)
Patient informed. 

## 2020-09-20 ENCOUNTER — Telehealth: Payer: Self-pay | Admitting: Gastroenterology

## 2020-09-20 NOTE — Telephone Encounter (Signed)
Pt picked up Prilosec from [facility] and she is confused about medication. She is not sure if she is supposed to take that on top of all the meds that she is taking or if she is taking that in place of one of the meds that she currently takes. She states that she suffers from her kidneys so is very careful with medications. Pls call her.

## 2020-09-20 NOTE — Telephone Encounter (Signed)
The pt has picked up her omeprazole and is confused on how to take the PPI and all her other meds.  Some have to be 2 hours away from other meds and others with food and some without. I have advised her to call the pharmacy and see if they can help with adjusting the timing of the meds so that she is taking correctly.  She verbalized understanding and was also reminded to call Dr Perini's office to make them aware that she has started the medication.  The pt has been advised of the information and verbalized understanding.

## 2020-10-11 DIAGNOSIS — S0003XA Contusion of scalp, initial encounter: Secondary | ICD-10-CM | POA: Diagnosis not present

## 2020-10-11 DIAGNOSIS — M542 Cervicalgia: Secondary | ICD-10-CM | POA: Diagnosis not present

## 2020-10-11 DIAGNOSIS — T148XXA Other injury of unspecified body region, initial encounter: Secondary | ICD-10-CM | POA: Diagnosis not present

## 2020-10-11 DIAGNOSIS — W010XXA Fall on same level from slipping, tripping and stumbling without subsequent striking against object, initial encounter: Secondary | ICD-10-CM | POA: Diagnosis not present

## 2020-11-01 DIAGNOSIS — H524 Presbyopia: Secondary | ICD-10-CM | POA: Diagnosis not present

## 2020-11-01 DIAGNOSIS — H26493 Other secondary cataract, bilateral: Secondary | ICD-10-CM | POA: Diagnosis not present

## 2020-11-02 DIAGNOSIS — Z1231 Encounter for screening mammogram for malignant neoplasm of breast: Secondary | ICD-10-CM | POA: Diagnosis not present

## 2020-11-02 DIAGNOSIS — Z7689 Persons encountering health services in other specified circumstances: Secondary | ICD-10-CM | POA: Diagnosis not present

## 2020-11-23 ENCOUNTER — Other Ambulatory Visit: Payer: Self-pay | Admitting: Physician Assistant

## 2020-11-26 DIAGNOSIS — E785 Hyperlipidemia, unspecified: Secondary | ICD-10-CM | POA: Diagnosis not present

## 2020-11-26 DIAGNOSIS — M810 Age-related osteoporosis without current pathological fracture: Secondary | ICD-10-CM | POA: Diagnosis not present

## 2020-11-26 DIAGNOSIS — E039 Hypothyroidism, unspecified: Secondary | ICD-10-CM | POA: Diagnosis not present

## 2020-12-02 DIAGNOSIS — G43C1 Periodic headache syndromes in child or adult, intractable: Secondary | ICD-10-CM | POA: Diagnosis not present

## 2020-12-02 DIAGNOSIS — R0602 Shortness of breath: Secondary | ICD-10-CM | POA: Diagnosis not present

## 2020-12-02 DIAGNOSIS — K921 Melena: Secondary | ICD-10-CM | POA: Diagnosis not present

## 2020-12-02 DIAGNOSIS — I2510 Atherosclerotic heart disease of native coronary artery without angina pectoris: Secondary | ICD-10-CM | POA: Diagnosis not present

## 2020-12-02 DIAGNOSIS — R82998 Other abnormal findings in urine: Secondary | ICD-10-CM | POA: Diagnosis not present

## 2020-12-02 DIAGNOSIS — I701 Atherosclerosis of renal artery: Secondary | ICD-10-CM | POA: Diagnosis not present

## 2020-12-02 DIAGNOSIS — Z0000 Encounter for general adult medical examination without abnormal findings: Secondary | ICD-10-CM | POA: Diagnosis not present

## 2020-12-02 DIAGNOSIS — N1831 Chronic kidney disease, stage 3a: Secondary | ICD-10-CM | POA: Diagnosis not present

## 2020-12-02 DIAGNOSIS — Z1331 Encounter for screening for depression: Secondary | ICD-10-CM | POA: Diagnosis not present

## 2020-12-02 DIAGNOSIS — J439 Emphysema, unspecified: Secondary | ICD-10-CM | POA: Diagnosis not present

## 2020-12-02 DIAGNOSIS — J019 Acute sinusitis, unspecified: Secondary | ICD-10-CM | POA: Diagnosis not present

## 2020-12-02 DIAGNOSIS — R7301 Impaired fasting glucose: Secondary | ICD-10-CM | POA: Diagnosis not present

## 2020-12-02 DIAGNOSIS — I129 Hypertensive chronic kidney disease with stage 1 through stage 4 chronic kidney disease, or unspecified chronic kidney disease: Secondary | ICD-10-CM | POA: Diagnosis not present

## 2020-12-02 DIAGNOSIS — I519 Heart disease, unspecified: Secondary | ICD-10-CM | POA: Diagnosis not present

## 2020-12-02 DIAGNOSIS — J449 Chronic obstructive pulmonary disease, unspecified: Secondary | ICD-10-CM | POA: Diagnosis not present

## 2021-01-11 ENCOUNTER — Telehealth: Payer: Self-pay | Admitting: *Deleted

## 2021-01-11 NOTE — Telephone Encounter (Signed)
Patient called to ask if prilosec will affect her kidney function. Advised her to call her nephrologist for information about renal disease and medications.

## 2021-01-20 DIAGNOSIS — L218 Other seborrheic dermatitis: Secondary | ICD-10-CM | POA: Diagnosis not present

## 2021-01-20 DIAGNOSIS — D225 Melanocytic nevi of trunk: Secondary | ICD-10-CM | POA: Diagnosis not present

## 2021-01-20 DIAGNOSIS — L821 Other seborrheic keratosis: Secondary | ICD-10-CM | POA: Diagnosis not present

## 2021-01-20 DIAGNOSIS — D1801 Hemangioma of skin and subcutaneous tissue: Secondary | ICD-10-CM | POA: Diagnosis not present

## 2021-01-20 DIAGNOSIS — D692 Other nonthrombocytopenic purpura: Secondary | ICD-10-CM | POA: Diagnosis not present

## 2021-02-02 DIAGNOSIS — Z23 Encounter for immunization: Secondary | ICD-10-CM | POA: Diagnosis not present

## 2021-02-16 DIAGNOSIS — L570 Actinic keratosis: Secondary | ICD-10-CM | POA: Diagnosis not present

## 2021-02-21 ENCOUNTER — Other Ambulatory Visit: Payer: Self-pay | Admitting: *Deleted

## 2021-02-21 MED ORDER — ATORVASTATIN CALCIUM 40 MG PO TABS: 40.0000 mg | ORAL_TABLET | Freq: Every day | ORAL | 0 refills | Status: DC

## 2021-03-03 NOTE — Progress Notes (Deleted)
Cardiology Office Note:    Date:  03/03/2021   ID:  Katherine Greer, DOB 1950-11-09, MRN [id number]  PCP:  Crist Infante, MD   [facility] Providers Cardiologist:  Ena Dawley, MD (Inactive) {  Referring MD: Crist Infante, MD    History of Present Illness:    Katherine Greer is a 70 y.o. 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 Meda Coffee who now presentes to clinic for follow-up.  Last saw Dr. Meda Coffee 02/13/20 where she was doing well. Exercising without anginal symptoms. Blood pressure had improved since renal artery stenting. Last TTE 08/2018 LVEF 60 to 65% with grade 1 DD and increased filling pressures.   Today,  Past Medical History:  Diagnosis Date   Abdominal bloating 05/03/2018   Allergy    Arthritis    Asthma    Cataract    Chronic kidney disease    25% function in the right kidney   Colon polyp    GERD (gastroesophageal reflux disease)    Heart murmur    Hypertension    Osteopenia    Pill dysphagia 05/03/2018   SOB (shortness of breath) 05/03/2018   Thyroid disease    UTI (urinary tract infection)     Past Surgical History:  Procedure Laterality Date   FOOT SURGERY Bilateral    PERIPHERAL VASCULAR INTERVENTION Right 04/30/2019   Procedure: PERIPHERAL VASCULAR INTERVENTION;  Surgeon: Marty Heck, MD;  Location: [facility];  Service: Cardiovascular;  Laterality: Right;  Renal artery   RENAL ANGIOGRAPHY Right 04/30/2019   Procedure: RENAL ANGIOGRAPHY;  Surgeon: Marty Heck, MD;  Location: [facility];  Service: Cardiovascular;  Laterality: Right;   ROTATOR CUFF REPAIR Right     Current Medications: No outpatient medications have been marked as taking for the 03/08/21 encounter (Appointment) with Freada Bergeron, MD.    Current Facility-Administered Medications for the 03/08/21 encounter (Appointment) with Freada Bergeron, MD  Medication   0.9 %  sodium chloride infusion     Allergies:   Augmentin [amoxicillin-pot clavulanate], Keflex [cephalexin], Levaquin [levofloxacin in d5w], Sulfa antibiotics, Carvedilol, Cephalosporins, Contrast media [iodinated diagnostic agents], Depakote er [divalproex sodium er], Latex, Nickel, Nsaids, Tobramycin, Topamax [topiramate], and Tramadol   Social History   Socioeconomic History   Marital status: Married    Spouse name: Not on file   Number of children: 2   Years of education: Not on file   Highest education level: Not on file  Occupational History   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  Tobacco Use   Smoking status: Former    Pack years: 0.00   Smokeless tobacco: Never   Tobacco comments:    Quit 2015, uses nicotine lozenges  Vaping Use   Vaping Use: Never used  Substance and Sexual Activity   Alcohol use: Yes    Comment: wine 4 times a week   Drug use: Never   Sexual activity: Yes  Other Topics Concern   Not on file  Social History Narrative   Not on file   Social Determinants of Health   Financial Resource Strain: Not on file  Food Insecurity: Not on file  Transportation Needs: Not on file  Physical Activity: Not on file  Stress: Not on file  Social Connections: Not on file  Family History: The patient's ***family history includes Breast cancer in her sister; Cancer in her son; Chronic Renal Failure in her father; Diabetes in her brother and father; Heart attack in her mother; Lung cancer in her father. There is no history of Colon cancer, Esophageal cancer, Inflammatory bowel disease, Liver disease, Pancreatic cancer, Stomach cancer, Rectal cancer, or Colon polyps.  ROS:   Please see the history of present illness.    *** All other systems reviewed and are negative.  EKGs/Labs/Other Studies Reviewed:    The  following studies were reviewed today: 2D echo 1/2020TTE: 08/2018   Left ventricle: The cavity size was normal. There was mild   concentric hypertrophy. Systolic function was normal. The   estimated ejection fraction was in the range of 60% to 65%. Wall   motion was normal; there were no regional wall motion   abnormalities. Doppler parameters are consistent with abnormal   left ventricular relaxation (grade 1 diastolic dysfunction).   Doppler parameters are consistent with elevated ventricular   end-diastolic filling pressure. - Aortic valve: There was mild regurgitation. - Right ventricle: The cavity size was normal. Wall thickness was   normal. Systolic function was normal. - Right atrium: The atrium was normal in size. - Tricuspid valve: There was trivial regurgitation. - Pulmonary arteries: Systolic pressure was within the normal   range. - Inferior vena cava: The vessel was normal in size. - Pericardium, extracardiac: There was no pericardial effusion.   Coronary CTA: 09/2018 1. Coronary calcium score of 46. This was 48 percentile for age and sex matched control. 2. Normal coronary origin with right dominance. 3. Minimal diffuse CAD.  Risk factor modification is recommended.    EKG:  EKG is *** ordered today.  The ekg ordered today demonstrates ***  Recent Labs: No results found for requested labs within last 8760 hours.  Recent Lipid Panel    Component Value Date/Time   CHOL 157 10/23/2019 0757   TRIG 145 10/23/2019 0757   HDL 53 10/23/2019 0757   CHOLHDL 3.0 10/23/2019 0757   LDLCALC 79 10/23/2019 0757     Risk Assessment/Calculations:   {Does this patient have ATRIAL FIBRILLATION?:[phone]}       Physical Exam:    VS:  There were no vitals taken for this visit.    Wt Readings from Last 3 Encounters:  09/07/20 198 lb (89.8 kg)  09/01/20 198 lb 3.2 oz (89.9 kg)  03/23/20 192 lb (87.1 kg)     GEN: *** Well nourished, well developed in no acute  distress HEENT: Normal NECK: No JVD; No carotid bruits LYMPHATICS: No lymphadenopathy CARDIAC: ***RRR, no murmurs, rubs, gallops RESPIRATORY:  Clear to auscultation without rales, wheezing or rhonchi  ABDOMEN: Soft, non-tender, non-distended MUSCULOSKELETAL:  No edema; No deformity  SKIN: Warm and dry NEUROLOGIC:  Alert and oriented x 3 PSYCHIATRIC:  Normal affect   ASSESSMENT:    No diagnosis found. PLAN:    In order of problems listed above:  #Chronic Diastolic Heart Failure: TTE in 2020 with normal LVEF, G1DD. Currently appears compensated and euvolemic on examination.  -Continue lasix 40mg  daily as needed -Continue spiro 25mg  daily   #Mild Non-Obstructive CAD: No anginal symptoms. -Continue ASA 81mg  daily -Continue lipitor 40mg  daily  #HLD: -Continue lipitor 40mg  daily  #Renal Artery Stenosis s/p stenting: Doing well, blood pressure controlled.  -Continue lipitor 40mg  daily  #CKD Stage IIIA: Cr 1.26 with GFR 43. -Repeat BMET for monitoring   {Are you ordering a CV Procedure (e.g.  stress test, cath, DCCV, TEE, etc)?   Press F2        :[id number]}    Medication Adjustments/Labs and Tests Ordered: Current medicines are reviewed at length with the patient today.  Concerns regarding medicines are outlined above.  No orders of the defined types were placed in this encounter.  No orders of the defined types were placed in this encounter.   There are no Patient Instructions on file for this visit.   Signed, Freada Bergeron, MD  03/03/2021 4:41 PM    [facility]

## 2021-03-08 ENCOUNTER — Other Ambulatory Visit: Payer: Self-pay

## 2021-03-08 ENCOUNTER — Ambulatory Visit (INDEPENDENT_AMBULATORY_CARE_PROVIDER_SITE_OTHER): Payer: Medicare Other | Admitting: Cardiology

## 2021-03-08 ENCOUNTER — Encounter: Payer: Self-pay | Admitting: Cardiology

## 2021-03-08 VITALS — BP 124/72 | HR 79 | Ht 67.0 in | Wt 203.6 lb

## 2021-03-08 DIAGNOSIS — N183 Chronic kidney disease, stage 3 unspecified: Secondary | ICD-10-CM | POA: Diagnosis not present

## 2021-03-08 DIAGNOSIS — I701 Atherosclerosis of renal artery: Secondary | ICD-10-CM

## 2021-03-08 DIAGNOSIS — E669 Obesity, unspecified: Secondary | ICD-10-CM | POA: Diagnosis not present

## 2021-03-08 DIAGNOSIS — I10 Essential (primary) hypertension: Secondary | ICD-10-CM

## 2021-03-08 DIAGNOSIS — I2510 Atherosclerotic heart disease of native coronary artery without angina pectoris: Secondary | ICD-10-CM

## 2021-03-08 DIAGNOSIS — I5032 Chronic diastolic (congestive) heart failure: Secondary | ICD-10-CM | POA: Diagnosis not present

## 2021-03-08 DIAGNOSIS — E782 Mixed hyperlipidemia: Secondary | ICD-10-CM

## 2021-03-08 MED ORDER — FUROSEMIDE 40 MG PO TABS: 40.0000 mg | ORAL_TABLET | ORAL | 6 refills | Status: DC | PRN

## 2021-03-08 MED ORDER — ATORVASTATIN CALCIUM 40 MG PO TABS: 40.0000 mg | ORAL_TABLET | Freq: Every day | ORAL | 3 refills | Status: DC

## 2021-03-08 MED ORDER — SPIRONOLACTONE 25 MG PO TABS: ORAL_TABLET | ORAL | 3 refills | Status: DC

## 2021-03-08 NOTE — Progress Notes (Signed)
Cardiology Office Note:    Date:  03/08/2021   ID:  Katherine Greer, DOB [birth year], MRN [id number]  PCP:  Crist Infante, MD   [facility] Providers Cardiologist:  Ena Dawley, MD (Inactive) {  Referring MD: Crist Infante, MD    History of Present Illness:    Katherine Greer is a 70 y.o. 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 Meda Coffee who now presentes to clinic for follow-up.  Last saw Dr. Meda Coffee 02/13/20 where she was doing well. Exercising without anginal symptoms. Blood pressure had improved since renal artery stenting. Last TTE 08/2018 LVEF 60 to 65% with grade 1 DD and increased filling pressures.   Today, she is feeling good overall. Generally, she notes not having the energy she used to have due to aging, but continues to do well. Currently her regimen includes spironolactone, which she tolerates well. She is taking Lasix about once every week to two weeks.  She does follow with a nephrologist, and reports her kidney function is unchanged at this time. In 2020 she had a stent placed in her right renal artery. Her previous headaches were found to be linked with her kidney issues, and have resolved completely.   For activity, she joined the [employer] at [employer], and often goes with her husband. In the past 3 years, she has gained weight, and we discussed weight-loss management options including diet and medication.   At her last visit with her PCP, her cholesterol was high but still within normal limits.  She denies any chest pain, shortness of breath, palpitations, or exertional symptoms. No lightheadedness, or syncope to report. Also has no lower extremity edema, orthopnea or PND.   Past Medical History:  Diagnosis Date   Abdominal bloating 05/03/2018    Allergy    Arthritis    Asthma    Cataract    Chronic kidney disease    25% function in the right kidney   Colon polyp    GERD (gastroesophageal reflux disease)    Heart murmur    Hypertension    Osteopenia    Pill dysphagia 05/03/2018   SOB (shortness of breath) 05/03/2018   Thyroid disease    UTI (urinary tract infection)     Past Surgical History:  Procedure Laterality Date   FOOT SURGERY Bilateral    PERIPHERAL VASCULAR INTERVENTION Right 04/30/2019   Procedure: PERIPHERAL VASCULAR INTERVENTION;  Surgeon: Marty Heck, MD;  Location: [place] [facility];  Service: Cardiovascular;  Laterality: Right;  Renal artery   RENAL ANGIOGRAPHY Right 04/30/2019   Procedure: RENAL ANGIOGRAPHY;  Surgeon: Marty Heck, MD;  Location: [place] [facility];  Service: Cardiovascular;  Laterality: Right;   ROTATOR CUFF REPAIR Right     Current Medications: Current Meds  Medication Sig   acetaminophen (TYLENOL) 500 MG tablet Take 1,000 mg by mouth every 6 (six) hours as needed for mild pain or moderate pain.    aspirin EC 81 MG tablet Take 81 mg by mouth daily. Swallow whole.   Cholecalciferol 2000 units CAPS Take 2,000 Units by mouth at bedtime.    famotidine (PEPCID) 20 MG tablet Take 1 tablet (20 mg total) by mouth 2 (two) times daily.   fluticasone (FLONASE) 50 MCG/ACT nasal spray Place 1 spray into both nostrils daily as needed for  allergies or rhinitis.   gabapentin (NEURONTIN) 100 MG capsule Take 100 mg by mouth 2 (two) times daily.   halobetasol (ULTRAVATE) 0.05 % cream Apply 1 application topically 2 (two) times daily as needed (dermatitis).    ibandronate (BONIVA) 150 MG tablet Take 150 mg by mouth every 30 (thirty) days.   levothyroxine (SYNTHROID, LEVOTHROID) 112 MCG tablet Take 112 mcg by mouth daily before breakfast.   nicotine polacrilex (COMMIT) 4 MG lozenge Take 4 mg by mouth as needed for smoking cessation.   omeprazole (PRILOSEC) 40 MG capsule Take 1 capsule (40 mg  total) by mouth daily.   tolterodine (DETROL LA) 4 MG 24 hr capsule Take 4 mg by mouth daily.   triamcinolone cream (KENALOG) 0.1 % Apply 1 application topically 2 (two) times daily as needed (dermatitis).   [DISCONTINUED] atorvastatin (LIPITOR) 40 MG tablet Take 1 tablet (40 mg total) by mouth at bedtime.   [DISCONTINUED] furosemide (LASIX) 40 MG tablet Take 40 mg by mouth as needed.   [DISCONTINUED] spironolactone (ALDACTONE) 25 MG tablet TAKE 1 TABLET(25 MG) BY MOUTH TWICE DAILY   Current Facility-Administered Medications for the 03/08/21 encounter (Office Visit) with Freada Bergeron, MD  Medication   0.9 %  sodium chloride infusion     Allergies:   Augmentin [amoxicillin-pot clavulanate], Keflex [cephalexin], Levaquin [levofloxacin in d5w], Sulfa antibiotics, Carvedilol, Cephalosporins, Contrast media [iodinated diagnostic agents], Depakote er [divalproex sodium er], Latex, Nickel, Nsaids, Tobramycin, Topamax [topiramate], and Tramadol   Social History   Socioeconomic History   Marital status: Married    Spouse name: Not on file   Number of children: 2   Years of education: Not on file   Highest education level: Not on file  Occupational History   Occupation: Teacher, 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  Tobacco Use   Smoking status: Former    Pack years: 0.00   Smokeless tobacco: Never   Tobacco comments:    Quit 2015, uses nicotine lozenges  Vaping Use   Vaping Use: Never used  Substance and Sexual Activity   Alcohol use: Yes    Comment: wine 4 times a week   Drug use: Never   Sexual activity: Yes  Other Topics Concern   Not on file  Social History Narrative   Not on file   Social Determinants of Health   Financial Resource Strain: Not on file  Food Insecurity: Not on file  Transportation Needs: Not on file  Physical Activity: Not on file  Stress: Not on file  Social Connections: Not on file     Family History: The patient's family history includes Breast cancer  in her sister; Cancer in her son; Chronic Renal Failure in her father; Diabetes in her brother and father; Heart attack in her mother; Lung cancer in her father. There is no history of Colon cancer, Esophageal cancer, Inflammatory bowel disease, Liver disease, Pancreatic cancer, Stomach cancer, Rectal cancer, or Colon polyps.  ROS:   Review of Systems  Constitutional:  Negative for fever and weight loss.  HENT:  Negative for ear discharge and hearing loss.   Eyes:  Negative for double vision and discharge.  Respiratory:  Negative for cough, hemoptysis and sputum production.   Cardiovascular:  Negative for chest pain, palpitations, orthopnea, claudication, leg swelling and PND.  Gastrointestinal:  Negative for constipation and diarrhea.  Genitourinary:  Negative for flank pain and urgency.  Musculoskeletal:  Negative for myalgias and neck pain.  Neurological:  Negative for speech change, loss  of consciousness and headaches.  Endo/Heme/Allergies:  Negative for polydipsia.  Psychiatric/Behavioral:  Negative for suicidal ideas. The patient does not have insomnia.     EKGs/Labs/Other Studies Reviewed:    The following studies were reviewed today:  2D echo 1/2020TTE: 08/2018   Left ventricle: The cavity size was normal. There was mild   concentric hypertrophy. Systolic function was normal. The   estimated ejection fraction was in the range of 60% to 65%. Wall   motion was normal; there were no regional wall motion   abnormalities. Doppler parameters are consistent with abnormal   left ventricular relaxation (grade 1 diastolic dysfunction).   Doppler parameters are consistent with elevated ventricular   end-diastolic filling pressure. - Aortic valve: There was mild regurgitation. - Right ventricle: The cavity size was normal. Wall thickness was   normal. Systolic function was normal. - Right atrium: The atrium was normal in size. - Tricuspid valve: There was trivial regurgitation. -  Pulmonary arteries: Systolic pressure was within the normal   range. - Inferior vena cava: The vessel was normal in size. - Pericardium, extracardiac: There was no pericardial effusion.   Coronary CTA: 09/2018 1. Coronary calcium score of 46. This was 40 percentile for age and sex matched control. 2. Normal coronary origin with right dominance. 3. Minimal diffuse CAD.  Risk factor modification is recommended.    EKG:  03/08/2021: NSR, HR 79 bpm  Recent Labs: No results found for requested labs within last 8760 hours.  Recent Lipid Panel    Component Value Date/Time   CHOL 157 10/23/2019 0757   TRIG 145 10/23/2019 0757   HDL 53 10/23/2019 0757   CHOLHDL 3.0 10/23/2019 0757   LDLCALC 79 10/23/2019 0757     Risk Assessment/Calculations:           Physical Exam:    VS:  BP 124/72   Pulse 79   Ht 5\' 7"  (1.702 m)   Wt 203 lb 9.6 oz (92.4 kg)   SpO2 98%   BMI 31.89 kg/m     Wt Readings from Last 3 Encounters:  03/08/21 203 lb 9.6 oz (92.4 kg)  09/07/20 198 lb (89.8 kg)  09/01/20 198 lb 3.2 oz (89.9 kg)     GEN: Well nourished, well developed in no acute distress HEENT: Normal NECK: No JVD; No carotid bruits CARDIAC: RRR, no murmurs, rubs, gallops RESPIRATORY:  Clear to auscultation without rales, wheezing or rhonchi  ABDOMEN: Soft, non-tender, non-distended MUSCULOSKELETAL:  No edema; No deformity  SKIN: Warm and dry NEUROLOGIC:  Alert and oriented x 3 PSYCHIATRIC:  Normal affect   ASSESSMENT:    1. Chronic diastolic CHF (congestive heart failure) ([place])   2. Essential hypertension   3. Coronary artery disease involving native coronary artery of native heart without angina pectoris   4. Mixed hyperlipidemia   5. Renal artery stenosis (HCC)   6. Stage 3 chronic kidney disease, unspecified whether stage 3a or 3b CKD (HCC)   7. Obesity (BMI 30-39.9)    PLAN:    In order of problems listed above:  #Chronic Diastolic Heart Failure: TTE in 2020 with normal  LVEF, G1DD. Currently appears compensated and euvolemic on examination. Taking lasix about 1x/2 weeks.  -Continue lasix 40mg  daily as needed -Continue spiro 25mg  daily  -Low Na diet  #Mild Non-Obstructive CAD: No anginal symptoms. -Continue ASA 81mg  daily -Continue lipitor 40mg  daily  #HLD: -Continue lipitor 40mg  daily -Lipids monitored by PCP  #Renal Artery Stenosis s/p stenting: Doing well,  blood pressure controlled.  -Continue lipitor 40mg  daily -Continue spironolactone as above  #CKD Stage IIIA: Cr 1.26 with GFR 43. -Repeat BMET for monitoring  #Obesity: BMI 31. Discussed diet and weight loss at length today. Wants to think about possible ozemic/wegovy. May be interested in intermittent fasting. -Trial of intermittent fasting with mediterranean diet -May consider GLP-1 agonist in future  Exercise recommendations: Goal of exercising for at least 30 minutes a day, at least 5 times per week.  Please exercise to a moderate exertion.  This means that while exercising it is difficult to speak in full sentences, however you are not so short of breath that you feel you must stop, and not so comfortable that you can carry on a full conversation.  Exertion level should be approximately a 5/10, if 10 is the most exertion you can perform.  Diet recommendations: Recommend a heart healthy diet such as the Mediterranean diet.  This diet consists of plant based foods, healthy fats, lean meats, olive oil.  It suggests limiting the intake of simple carbohydrates such as white breads, pastries, and pastas.  It also limits the amount of red meat, wine, and dairy products such as cheese that one should consume on a daily basis.     Follow-up in 1 year.  Medication Adjustments/Labs and Tests Ordered: Current medicines are reviewed at length with the patient today.  Concerns regarding medicines are outlined above.  Orders Placed This Encounter  Procedures   EKG 12-Lead   Meds ordered this  encounter  Medications   spironolactone (ALDACTONE) 25 MG tablet    Sig: TAKE 1 TABLET(25 MG) BY MOUTH TWICE DAILY    Dispense:  180 tablet    Refill:  3   furosemide (LASIX) 40 MG tablet    Sig: Take 1 tablet (40 mg total) by mouth as needed.    Dispense:  30 tablet    Refill:  6   atorvastatin (LIPITOR) 40 MG tablet    Sig: Take 1 tablet (40 mg total) by mouth at bedtime.    Dispense:  90 tablet    Refill:  3    Patient Instructions  Medication Instructions:   Your physician recommends that you continue on your current medications as directed. Please refer to the Current Medication list given to you today.  *If you need a refill on your cardiac medications before your next appointment, please call your pharmacy*   Follow-Up: At [facility], you and your health needs are our priority.  As part of our continuing mission to provide you with exceptional heart care, we have created designated Provider Care Teams.  These Care Teams include your primary Cardiologist (physician) and Advanced Practice Providers (APPs -  Physician Assistants and Nurse Practitioners) who all work together to provide you with the care you need, when you need it.  We recommend signing up for the patient portal called "MyChart".  Sign up information is provided on this After Visit Summary.  MyChart is used to connect with patients for Virtual Visits (Telemedicine).  Patients are able to view lab/test results, encounter notes, upcoming appointments, etc.  Non-urgent messages can be sent to your provider as well.   To learn more about what you can do with MyChart, go to [email or web address].    Your next appointment:   1 year(s)  The format for your next appointment:   In Person  Provider:   Gwyndolyn Kaufman, MD      [facility] Stumpf,acting as a scribe for [employer]  Johney Frame, MD.,have documented all relevant documentation on the behalf of Freada Bergeron, MD,as directed by  Freada Bergeron,  MD while in the presence of Freada Bergeron, MD.  I, Freada Bergeron, MD, have reviewed all documentation for this visit. The documentation on 03/08/21 for the exam, diagnosis, procedures, and orders are all accurate and complete.   Signed, Freada Bergeron, MD  03/08/2021 10:53 AM    [place]

## 2021-03-08 NOTE — Patient Instructions (Signed)

## 2021-03-11 ENCOUNTER — Other Ambulatory Visit: Payer: Self-pay | Admitting: Gastroenterology

## 2021-03-18 ENCOUNTER — Other Ambulatory Visit: Payer: Self-pay | Admitting: *Deleted

## 2021-03-18 DIAGNOSIS — I701 Atherosclerosis of renal artery: Secondary | ICD-10-CM

## 2021-03-23 DIAGNOSIS — N1831 Chronic kidney disease, stage 3a: Secondary | ICD-10-CM | POA: Diagnosis not present

## 2021-03-23 DIAGNOSIS — I701 Atherosclerosis of renal artery: Secondary | ICD-10-CM | POA: Diagnosis not present

## 2021-03-23 DIAGNOSIS — I129 Hypertensive chronic kidney disease with stage 1 through stage 4 chronic kidney disease, or unspecified chronic kidney disease: Secondary | ICD-10-CM | POA: Diagnosis not present

## 2021-03-29 ENCOUNTER — Ambulatory Visit (HOSPITAL_COMMUNITY)
Admission: RE | Admit: 2021-03-29 | Discharge: 2021-03-29 | Disposition: A | Discharge status: Home / Self Care | Payer: Medicare Other | Source: Ambulatory Visit | Attending: Vascular Surgery | Admitting: Vascular Surgery

## 2021-03-29 ENCOUNTER — Other Ambulatory Visit: Payer: Self-pay

## 2021-03-29 ENCOUNTER — Encounter: Payer: Self-pay | Admitting: Vascular Surgery

## 2021-03-29 ENCOUNTER — Ambulatory Visit (INDEPENDENT_AMBULATORY_CARE_PROVIDER_SITE_OTHER): Payer: Medicare Other | Admitting: Vascular Surgery

## 2021-03-29 VITALS — BP 120/72 | HR 67 | Temp 97.3°F | Resp 16 | Ht 66.0 in | Wt 198.0 lb

## 2021-03-29 DIAGNOSIS — I701 Atherosclerosis of renal artery: Secondary | ICD-10-CM | POA: Diagnosis not present

## 2021-03-29 NOTE — Progress Notes (Signed)
Patient name: Katherine Greer MRN: [id number] DOB: May 07, 1951 Sex: female  REASON FOR VISIT: 1 year follow-up after right renal artery stent for high-grade renal artery stenosis  HPI: Katherine Greer is a 70 y.o. female presents for 1 year follow-up after right renal artery stent for high-grade stenosis.  Her right renal artery stent was placed on 04/30/2019 via right common femoral artery approach.  She was having HTN with headaches even while on optimal medical management.  Her blood pressures has been very well controlled since stent placement.  She remains on just spironolactone.  Systolics 99991111 usually.  No further headaches.  Taking a baby aspirin.  Past Medical History:  Diagnosis Date   Abdominal bloating 05/03/2018   Allergy    Arthritis    Asthma    Cataract    Chronic kidney disease    25% function in the right kidney   Colon polyp    GERD (gastroesophageal reflux disease)    Heart murmur    Hypertension    Osteopenia    Pill dysphagia 05/03/2018   SOB (shortness of breath) 05/03/2018   Thyroid disease    UTI (urinary tract infection)     Past Surgical History:  Procedure Laterality Date   FOOT SURGERY Bilateral    PERIPHERAL VASCULAR INTERVENTION Right 04/30/2019   Procedure: PERIPHERAL VASCULAR INTERVENTION;  Surgeon: Marty Heck, MD;  Location: [place] [facility];  Service: Cardiovascular;  Laterality: Right;  Renal artery   RENAL ANGIOGRAPHY Right 04/30/2019   Procedure: RENAL ANGIOGRAPHY;  Surgeon: Marty Heck, MD;  Location: [place] [facility];  Service: Cardiovascular;  Laterality: Right;   ROTATOR CUFF REPAIR Right     Family History  Problem Relation Age of Onset   Cancer Son        Pituitary- Metastatic Brain   Heart attack Mother    Diabetes Father    Chronic Renal Failure Father    Lung cancer Father    Breast cancer Sister    Diabetes Brother    Colon cancer Neg Hx    Esophageal cancer Neg Hx    Inflammatory bowel disease Neg Hx    Liver  disease Neg Hx    Pancreatic cancer Neg Hx    Stomach cancer Neg Hx    Rectal cancer Neg Hx    Colon polyps Neg Hx     SOCIAL HISTORY: Social History   Tobacco Use   Smoking status: Former   Smokeless tobacco: Never   Tobacco comments:    Quit 2015, uses nicotine lozenges  Substance Use Topics   Alcohol use: Yes    Comment: wine 4 times a week    Allergies  Allergen Reactions   Augmentin [Amoxicillin-Pot Clavulanate] Anaphylaxis and Rash    Did it involve swelling of the face/tongue/throat, SOB, or low BP? Yes Did it involve sudden or severe rash/hives, skin peeling, or any reaction on the inside of your mouth or nose? Yes Did you need to seek medical attention at a hospital or doctor's office? No When did it last happen?      18 years If all above answers are "NO", may proceed with cephalosporin use.    Keflex [Cephalexin] Anaphylaxis   Levaquin [Levofloxacin In D5w] Anaphylaxis   Sulfa Antibiotics Anaphylaxis and Rash   Carvedilol Other (See Comments)    Pt reports carvedilol causes her hair loss.    Cephalosporins     Other reaction(s): Other (See Comments) NO REACTION STATED.   Contrast Media [  Iodinated Diagnostic Agents]     Lowered kidney function    Depakote Er [Divalproex Sodium Er] Nausea Only   Latex Itching   Nickel Hives    Topical dermatitis sees Dr. Wilhemina Bonito    Nsaids     Avoid due to kidney function    Tobramycin Swelling    redness   Topamax [Topiramate]     unknown   Tramadol     Per pt this causes flushing and facial swelling    Current Outpatient Medications  Medication Sig Dispense Refill   acetaminophen (TYLENOL) 500 MG tablet Take 1,000 mg by mouth every 6 (six) hours as needed for mild pain or moderate pain.      aspirin EC 81 MG tablet Take 81 mg by mouth daily. Swallow whole.     atorvastatin (LIPITOR) 40 MG tablet Take 1 tablet (40 mg total) by mouth at bedtime. 90 tablet 3   Cholecalciferol 2000 units CAPS Take 2,000 Units by mouth  at bedtime.      famotidine (PEPCID) 20 MG tablet TAKE 1 TABLET BY MOUTH TWICE DAILY 90 tablet 3   fluticasone (FLONASE) 50 MCG/ACT nasal spray Place 1 spray into both nostrils daily as needed for allergies or rhinitis.     furosemide (LASIX) 40 MG tablet Take 1 tablet (40 mg total) by mouth as needed. 30 tablet 6   gabapentin (NEURONTIN) 100 MG capsule Take 100 mg by mouth 2 (two) times daily.     halobetasol (ULTRAVATE) 0.05 % cream Apply 1 application topically 2 (two) times daily as needed (dermatitis).      ibandronate (BONIVA) 150 MG tablet Take 150 mg by mouth every 30 (thirty) days.     levothyroxine (SYNTHROID, LEVOTHROID) 112 MCG tablet Take 112 mcg by mouth daily before breakfast.     nicotine polacrilex (COMMIT) 4 MG lozenge Take 4 mg by mouth as needed for smoking cessation.     omeprazole (PRILOSEC) 40 MG capsule Take 1 capsule (40 mg total) by mouth daily. 90 capsule 3   spironolactone (ALDACTONE) 25 MG tablet TAKE 1 TABLET(25 MG) BY MOUTH TWICE DAILY 180 tablet 3   tolterodine (DETROL LA) 4 MG 24 hr capsule Take 4 mg by mouth daily.     triamcinolone cream (KENALOG) 0.1 % Apply 1 application topically 2 (two) times daily as needed (dermatitis).     cimetidine (TAGAMET) 200 MG tablet Take 200 mg by mouth daily as needed (acid reflux). (Patient not taking: Reported on 03/08/2021)     sucralfate (CARAFATE) 1 GM/10ML suspension Take 10 mLs (1 g total) by mouth 4 (four) times daily. (Patient not taking: Reported on 03/08/2021) 420 mL 3   Current Facility-Administered Medications  Medication Dose Route Frequency Provider Last Rate Last Admin   0.9 %  sodium chloride infusion  500 mL Intravenous Once Mansouraty, Telford Nab., MD        REVIEW OF SYSTEMS:  '[X]'$  denotes positive finding, '[ ]'$  denotes negative finding Cardiac  Comments:  Chest pain or chest pressure:    Shortness of breath upon exertion:    Short of breath when lying flat:    Irregular heart rhythm:        Vascular     Pain in calf, thigh, or hip brought on by ambulation:    Pain in feet at night that wakes you up from your sleep:     Blood clot in your veins:    Leg swelling:  Pulmonary    Oxygen at home:    Productive cough:     Wheezing:         Neurologic    Sudden weakness in arms or legs:     Sudden numbness in arms or legs:     Sudden onset of difficulty speaking or slurred speech:    Temporary loss of vision in one eye:     Problems with dizziness:         Gastrointestinal    Blood in stool:     Vomited blood:         Genitourinary    Burning when urinating:     Blood in urine:        Psychiatric    Major depression:         Hematologic    Bleeding problems:    Problems with blood clotting too easily:        Skin    Rashes or ulcers:        Constitutional    Fever or chills:      PHYSICAL EXAM: Vitals:   03/29/21 1008  BP: 120/72  Pulse: 67  Resp: 16  Temp: (!) 97.3 F (36.3 C)  TempSrc: Temporal  SpO2: 96%  Weight: 198 lb (89.8 kg)  Height: '5\' 6"'$  (1.676 m)    GENERAL: The patient is a well-nourished female, in no acute distress. The vital signs are documented above. Abdomen: soft, ND, NT Vascular: Palpable femoral pulses bilaterally.    DATA:   Right renal artery stent looks patent with no recurrent high-grade stenosis.  Assessment/Plan:  70 year old female who presents for 1 year follow-up after right renal artery angioplasty with stent placement on 04/30/19 for high-grade right renal artery stenosis.  Right renal stent remains patent on duplex with no evidence of recurrent high-grade stenosis.  Discussed I will see her again in 1 year with renal artery duplex.  Discussed she would continue aspirin for overall risk reduction and to maintain patency of the stent.  Very pleased that her blood pressures been stable and she is just on spironolactone.   Marty Heck, MD [facility] Office: [phone]

## 2021-04-27 DIAGNOSIS — I10 Essential (primary) hypertension: Secondary | ICD-10-CM | POA: Diagnosis not present

## 2021-04-27 DIAGNOSIS — E039 Hypothyroidism, unspecified: Secondary | ICD-10-CM | POA: Diagnosis not present

## 2021-04-27 DIAGNOSIS — J449 Chronic obstructive pulmonary disease, unspecified: Secondary | ICD-10-CM | POA: Diagnosis not present

## 2021-05-05 DIAGNOSIS — Z23 Encounter for immunization: Secondary | ICD-10-CM | POA: Diagnosis not present

## 2021-05-27 DIAGNOSIS — I10 Essential (primary) hypertension: Secondary | ICD-10-CM | POA: Diagnosis not present

## 2021-05-27 DIAGNOSIS — N183 Chronic kidney disease, stage 3 unspecified: Secondary | ICD-10-CM | POA: Diagnosis not present

## 2021-05-27 DIAGNOSIS — E039 Hypothyroidism, unspecified: Secondary | ICD-10-CM | POA: Diagnosis not present

## 2021-05-27 DIAGNOSIS — J449 Chronic obstructive pulmonary disease, unspecified: Secondary | ICD-10-CM | POA: Diagnosis not present

## 2021-05-30 DIAGNOSIS — K219 Gastro-esophageal reflux disease without esophagitis: Secondary | ICD-10-CM | POA: Diagnosis not present

## 2021-05-30 DIAGNOSIS — I10 Essential (primary) hypertension: Secondary | ICD-10-CM | POA: Diagnosis not present

## 2021-05-30 DIAGNOSIS — E785 Hyperlipidemia, unspecified: Secondary | ICD-10-CM | POA: Diagnosis not present

## 2021-05-30 DIAGNOSIS — K5900 Constipation, unspecified: Secondary | ICD-10-CM | POA: Diagnosis not present

## 2021-05-30 DIAGNOSIS — R7301 Impaired fasting glucose: Secondary | ICD-10-CM | POA: Diagnosis not present

## 2021-05-30 DIAGNOSIS — N3281 Overactive bladder: Secondary | ICD-10-CM | POA: Diagnosis not present

## 2021-05-30 DIAGNOSIS — I129 Hypertensive chronic kidney disease with stage 1 through stage 4 chronic kidney disease, or unspecified chronic kidney disease: Secondary | ICD-10-CM | POA: Diagnosis not present

## 2021-05-30 DIAGNOSIS — E039 Hypothyroidism, unspecified: Secondary | ICD-10-CM | POA: Diagnosis not present

## 2021-06-27 DIAGNOSIS — M2022 Hallux rigidus, left foot: Secondary | ICD-10-CM | POA: Diagnosis not present

## 2021-06-27 DIAGNOSIS — M7741 Metatarsalgia, right foot: Secondary | ICD-10-CM | POA: Insufficient documentation

## 2021-06-27 DIAGNOSIS — M2021 Hallux rigidus, right foot: Secondary | ICD-10-CM | POA: Diagnosis not present

## 2021-06-27 DIAGNOSIS — M2020 Hallux rigidus, unspecified foot: Secondary | ICD-10-CM | POA: Insufficient documentation

## 2021-06-27 DIAGNOSIS — M7742 Metatarsalgia, left foot: Secondary | ICD-10-CM | POA: Insufficient documentation

## 2021-06-29 ENCOUNTER — Other Ambulatory Visit: Payer: Self-pay | Admitting: Student

## 2021-06-29 DIAGNOSIS — M79671 Pain in right foot: Secondary | ICD-10-CM

## 2021-06-29 DIAGNOSIS — G7249 Other inflammatory and immune myopathies, not elsewhere classified: Secondary | ICD-10-CM | POA: Diagnosis not present

## 2021-07-02 ENCOUNTER — Ambulatory Visit
Admission: RE | Admit: 2021-07-02 | Discharge: 2021-07-02 | Disposition: A | Discharge status: Home / Self Care | Payer: Medicare Other | Source: Ambulatory Visit | Attending: Student | Admitting: Student

## 2021-07-02 DIAGNOSIS — Z9889 Other specified postprocedural states: Secondary | ICD-10-CM | POA: Diagnosis not present

## 2021-07-02 DIAGNOSIS — M79671 Pain in right foot: Secondary | ICD-10-CM

## 2021-07-02 DIAGNOSIS — M19071 Primary osteoarthritis, right ankle and foot: Secondary | ICD-10-CM | POA: Diagnosis not present

## 2021-07-07 ENCOUNTER — Telehealth: Payer: Self-pay | Admitting: Nurse Practitioner

## 2021-07-07 NOTE — Telephone Encounter (Signed)
Katherine Greer at [facility] asked of the pt has ever been diagnosed with gastroparesis. Per the chart we do not have that diagnosis.  While speaking with the pt (she was put on the phone) signal was lost.  Will try the pt tomorrow.

## 2021-07-07 NOTE — Telephone Encounter (Signed)
[facility] calling because they are wanting to start the patient on Weegovy or Ozempic medications for weightloss\pre diabetes but they would like to get Dr. Donneta Romberg advise first [phone].

## 2021-07-08 NOTE — Telephone Encounter (Signed)
Dr Rush Landmark do you have any thought on the pt starting ozempic or wegovy?

## 2021-07-08 NOTE — Telephone Encounter (Signed)
I see no contraindication based on patient not having cholelithiasis on prior imaging, no prior pancreatitis, gastroparesis that she could not be initiated on Ozempic or Wegovy though need to monitor her closely through the prescribing provider. Thanks. GM

## 2021-07-08 NOTE — Telephone Encounter (Signed)
I have made the PCP office aware that there are not contraindications from a GI standpoint.  No further concerns or questions.

## 2021-07-27 DIAGNOSIS — I129 Hypertensive chronic kidney disease with stage 1 through stage 4 chronic kidney disease, or unspecified chronic kidney disease: Secondary | ICD-10-CM | POA: Diagnosis not present

## 2021-07-27 DIAGNOSIS — M542 Cervicalgia: Secondary | ICD-10-CM | POA: Diagnosis not present

## 2021-07-27 DIAGNOSIS — E785 Hyperlipidemia, unspecified: Secondary | ICD-10-CM | POA: Diagnosis not present

## 2021-07-27 DIAGNOSIS — E039 Hypothyroidism, unspecified: Secondary | ICD-10-CM | POA: Diagnosis not present

## 2021-07-27 DIAGNOSIS — I10 Essential (primary) hypertension: Secondary | ICD-10-CM | POA: Diagnosis not present

## 2021-07-29 DIAGNOSIS — M542 Cervicalgia: Secondary | ICD-10-CM | POA: Diagnosis not present

## 2021-08-01 DIAGNOSIS — M542 Cervicalgia: Secondary | ICD-10-CM | POA: Diagnosis not present

## 2021-08-03 DIAGNOSIS — M542 Cervicalgia: Secondary | ICD-10-CM | POA: Diagnosis not present

## 2021-08-05 ENCOUNTER — Encounter: Payer: Self-pay | Admitting: Gastroenterology

## 2021-08-08 DIAGNOSIS — M542 Cervicalgia: Secondary | ICD-10-CM | POA: Diagnosis not present

## 2021-08-12 DIAGNOSIS — M542 Cervicalgia: Secondary | ICD-10-CM | POA: Diagnosis not present

## 2021-08-25 DIAGNOSIS — L309 Dermatitis, unspecified: Secondary | ICD-10-CM | POA: Diagnosis not present

## 2021-08-26 DIAGNOSIS — E785 Hyperlipidemia, unspecified: Secondary | ICD-10-CM | POA: Diagnosis not present

## 2021-08-26 DIAGNOSIS — E039 Hypothyroidism, unspecified: Secondary | ICD-10-CM | POA: Diagnosis not present

## 2021-08-26 DIAGNOSIS — I10 Essential (primary) hypertension: Secondary | ICD-10-CM | POA: Diagnosis not present

## 2021-08-26 DIAGNOSIS — I129 Hypertensive chronic kidney disease with stage 1 through stage 4 chronic kidney disease, or unspecified chronic kidney disease: Secondary | ICD-10-CM | POA: Diagnosis not present

## 2021-08-30 DIAGNOSIS — M542 Cervicalgia: Secondary | ICD-10-CM | POA: Diagnosis not present

## 2021-08-30 DIAGNOSIS — Z6831 Body mass index (BMI) 31.0-31.9, adult: Secondary | ICD-10-CM | POA: Diagnosis not present

## 2021-08-30 DIAGNOSIS — Z124 Encounter for screening for malignant neoplasm of cervix: Secondary | ICD-10-CM | POA: Diagnosis not present

## 2021-08-30 DIAGNOSIS — M8588 Other specified disorders of bone density and structure, other site: Secondary | ICD-10-CM | POA: Diagnosis not present

## 2021-08-30 DIAGNOSIS — N958 Other specified menopausal and perimenopausal disorders: Secondary | ICD-10-CM | POA: Diagnosis not present

## 2021-08-30 DIAGNOSIS — Z7983 Long term (current) use of bisphosphonates: Secondary | ICD-10-CM | POA: Diagnosis not present

## 2021-08-30 DIAGNOSIS — E039 Hypothyroidism, unspecified: Secondary | ICD-10-CM | POA: Diagnosis not present

## 2021-09-01 DIAGNOSIS — M542 Cervicalgia: Secondary | ICD-10-CM | POA: Diagnosis not present

## 2021-09-05 NOTE — Progress Notes (Signed)
Office Visit Note  Patient: Katherine Greer             Date of Birth: 1951-04-10           MRN: [id number]             PCP: Crist Infante, MD Referring: Wylene Simmer, MD Visit Date: 09/06/2021 Occupation: Retired Futures trader  Subjective:  New Patient (Initial Visit) (Bil foot pain, abnormal labs)   History of Present Illness: Katherine Greer is a 71 y.o. female here for evaluation of bilateral foot pain and swelling and positive ANA. 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 She has also noticed some numbness involving the lateral edge of her left foot. She experiences a lot of increased pain with weight bearing and usually more swelling with prolonged walking and standing some improvement after sleeping overnight. She used voltaren gel for arthritis but stopped due to renal impairment with renal artery stenosis. She was previously on dual antiplatelet for this with aspirin and plavix but down to just baby aspirin more recently. She saw a foot specialist Dr. Ouida Sills in [place] for the problem recommending she needs additional surgery to prevent further collapse or degenerative problems. She saw [facility] here more recently who also recommended probably to need surgery but testing for underlying inflammatory causes showed positive ANA needing to rule out inflammatory arthritis with her joint swelling and family history of RA.  Labs reviewed 06/2021 ANA 1:40 speckled RF neg CCP neg ESR 11 CK 84 eGFR 44 Uric acid 6.9 HLA B27 neg  Activities of Daily Living:  Patient reports morning stiffness for 0  none .   Patient Reports nocturnal pain.  Difficulty dressing/grooming: Denies Difficulty climbing stairs: Denies Difficulty getting out of chair:  Denies Difficulty using hands for taps, buttons, cutlery, and/or writing: Reports  Review of Systems  Constitutional:  Negative for fatigue.  HENT:  Negative for mouth dryness.   Eyes:  Positive for dryness.  Respiratory:  Negative for shortness of breath.   Cardiovascular:  Positive for swelling in legs/feet.  Gastrointestinal:  Positive for constipation.  Endocrine: Positive for heat intolerance.  Genitourinary:  Negative for difficulty urinating.  Musculoskeletal:  Positive for joint pain, gait problem, joint pain, joint swelling and muscle tenderness.  Skin:  Positive for rash.  Allergic/Immunologic: Negative for susceptible to infections.  Neurological:  Positive for numbness.  Hematological:  Positive for bruising/bleeding tendency.  Psychiatric/Behavioral:  Negative for sleep disturbance.     PMFS History:  Patient Active Problem List   Diagnosis Date Noted   OAB (overactive bladder) 09/06/2021   Positive ANA (antinuclear antibody) 09/06/2021   Hallux rigidus 06/27/2021   Metatarsalgia of left foot 06/27/2021   Metatarsalgia of right foot 06/27/2021   Arthritis 06/23/2019   Cluster headache 06/23/2019   Hyperlipidemia 06/23/2019   Kidney disease 06/23/2019   Renal artery stenosis ([place]) 05/06/2019   Gastroesophageal reflux disease 05/03/2018   History of colonic polyps 05/03/2018   Hemorrhoids 05/03/2018   Abdominal bloating 05/03/2018   Pill dysphagia 05/03/2018   IFG (impaired fasting glucose) 02/15/2015   Tobacco use 08/26/2013   Osteopenia 56/38/7564   Folliculitis 33/29/5188   Hypothyroidism 12/22/2008    Past Medical History:  Diagnosis Date   Abdominal bloating 05/03/2018   Allergy  Arthritis    Asthma    Cataract    CHF (congestive heart failure) (HCC)    Chronic kidney disease    25% function in the right kidney   Colon polyp    GERD (gastroesophageal reflux disease)    Heart murmur    Hypertension    Osteopenia    Pill dysphagia 05/03/2018    SOB (shortness of breath) 05/03/2018   Thyroid disease    UTI (urinary tract infection)     Family History  Problem Relation Age of Onset   Heart attack Mother    Diabetes Father    Chronic Renal Failure Father    Lung cancer Father    Breast cancer Sister    Lung cancer Sister    Diabetes Brother    Cancer Son        Pituitary- Metastatic Brain   Colon cancer Neg Hx    Esophageal cancer Neg Hx    Inflammatory bowel disease Neg Hx    Liver disease Neg Hx    Pancreatic cancer Neg Hx    Stomach cancer Neg Hx    Rectal cancer Neg Hx    Colon polyps Neg Hx    Past Surgical History:  Procedure Laterality Date   COLONOSCOPY WITH ESOPHAGOGASTRODUODENOSCOPY (EGD)  09/28/2021   [facility]   FOOT SURGERY Bilateral    PERIPHERAL VASCULAR INTERVENTION Right 04/30/2019   Procedure: PERIPHERAL VASCULAR INTERVENTION;  Surgeon: Marty Heck, MD;  Location: [facility];  Service: Cardiovascular;  Laterality: Right;  Renal artery   RENAL ANGIOGRAPHY Right 04/30/2019   Procedure: RENAL ANGIOGRAPHY;  Surgeon: Marty Heck, MD;  Location: [facility];  Service: Cardiovascular;  Laterality: Right;   ROTATOR CUFF REPAIR Right    Social History   Social History Narrative   Not on file   Immunization History  Administered Date(s) Administered   Fluad Quad(high Dose 65+) 05/11/2019   Influenza Split 06/05/2011   Influenza, High Dose Seasonal PF 06/08/2015, 05/25/2016, 05/22/2017, 07/04/2018   Influenza, Seasonal, Injecte, Preservative Fre 07/09/2013, 07/11/2014   Moderna Sars-Covid-2 Vaccination 10/11/2019, 11/08/2019, 02/03/2020, 06/28/2020, 05/05/2021   Pneumococcal Conjugate-13 08/12/2015   Pneumococcal Polysaccharide-23 06/08/2003   Td 08/25/2010   Tdap 07/05/2005, 08/26/2013   Zoster, Live 11/06/2012     Objective: Vital Signs: BP 111/66 (BP Location: Left Arm, Patient Position: Sitting, Cuff Size: Normal)   Pulse 79   Resp 17   Ht 5' 3.75" (1.619  m)   Wt 201 lb (91.2 kg)   BMI 34.77 kg/m    Physical Exam Eyes:     Conjunctiva/sclera: Conjunctivae normal.  Cardiovascular:     Rate and Rhythm: Normal rate and regular rhythm.  Pulmonary:     Effort: Pulmonary effort is normal.     Breath sounds: Normal breath sounds.  Musculoskeletal:     Right lower leg: No edema.     Left lower leg: No edema.  Skin:    General: Skin is warm and dry.     Comments: Bruising on backs of both hands Numerous superficial tortuous veins on bilateral feet and ankles  Neurological:     Mental Status: She is alert.      Musculoskeletal Exam:  Shoulders full ROM no tenderness or swelling Elbows full ROM no tenderness or swelling Wrists full ROM no tenderness or swelling Fingers with heberdon's nodes of PIP and DIP joints bilaterally, mildly decreased flexion ROM Knees full ROM no tenderness or swelling, patellofemoral crepitus present Ankles full  ROM no tenderness or swelling Right 1st MTP shortening, severely decreased ROM, left 1st MTP mildly restricted, no palpable synovitis bilaterally, callus present across all MTPs on plantar surface   Investigation: No additional findings.  Imaging: No results found.  Recent Labs: Lab Results  Component Value Date   WBC 8.0 10/23/2019   HGB 13.3 10/23/2019   PLT 308 10/23/2019   NA 142 10/23/2019   K 4.7 10/23/2019   CL 105 10/23/2019   CO2 21 10/23/2019   GLUCOSE 112 (H) 10/23/2019   BUN 16 10/23/2019   CREATININE 1.26 (H) 10/23/2019   BILITOT 0.8 04/21/2022   ALKPHOS 127 (H) 04/21/2022   AST 18 04/21/2022   ALT 20 04/21/2022   PROT 6.2 04/21/2022   ALBUMIN 4.2 04/21/2022   CALCIUM 9.2 10/23/2019   GFRAA 50 (L) 10/23/2019    Speciality Comments: No specialty comments available.  Procedures:  No procedures performed Allergies: Augmentin [amoxicillin-pot clavulanate], Keflex [cephalexin], Levaquin [levofloxacin in d5w], Sulfa antibiotics, Carvedilol, Cephalosporins, Contrast media  [iodinated contrast media], Depakote er [divalproex sodium er], Latex, Nickel, Nsaids, Tobramycin, Topamax [topiramate], and Tramadol   Assessment / Plan:     Visit Diagnoses: Positive ANA (antinuclear antibody) - Plan: RNP Antibody, Sjogrens syndrome-A extractable nuclear antibody, Anti-DNA antibody, double-stranded, Anti-Smith antibody  Positive ANA at a very low titer and no specific clinical criteria for systemic connective tissue disease on exam and history at this time.  We will check specific antibody panel as detailed above.  Arthritis Hallux rigidus of both feet  There are definite degenerative appearing changes present in the feet no active synovitis or other swelling that I can see today.  Extensive calluses across base of MTPs and foreshortening of the first MTP joint but no inflammation present.  Orders: Orders Placed This Encounter  Procedures   RNP Antibody   Sjogrens syndrome-A extractable nuclear antibody   Anti-DNA antibody, double-stranded   Anti-Smith antibody   No orders of the defined types were placed in this encounter.    Follow-Up Instructions: No follow-ups on file.   Collier Salina, MD  Note - This record has been created using [employer].  Chart creation errors have been sought, but may not always  have been located. Such creation errors do not reflect on  the standard of medical care.

## 2021-09-06 ENCOUNTER — Encounter: Payer: Self-pay | Admitting: Internal Medicine

## 2021-09-06 ENCOUNTER — Ambulatory Visit (INDEPENDENT_AMBULATORY_CARE_PROVIDER_SITE_OTHER): Payer: Medicare Other | Admitting: Internal Medicine

## 2021-09-06 ENCOUNTER — Other Ambulatory Visit: Payer: Self-pay

## 2021-09-06 VITALS — BP 111/66 | HR 79 | Resp 17 | Ht 63.75 in | Wt 201.0 lb

## 2021-09-06 DIAGNOSIS — M1990 Unspecified osteoarthritis, unspecified site: Secondary | ICD-10-CM | POA: Diagnosis not present

## 2021-09-06 DIAGNOSIS — M2022 Hallux rigidus, left foot: Secondary | ICD-10-CM | POA: Diagnosis not present

## 2021-09-06 DIAGNOSIS — M2021 Hallux rigidus, right foot: Secondary | ICD-10-CM

## 2021-09-06 DIAGNOSIS — N3281 Overactive bladder: Secondary | ICD-10-CM | POA: Insufficient documentation

## 2021-09-06 DIAGNOSIS — R768 Other specified abnormal immunological findings in serum: Secondary | ICD-10-CM | POA: Diagnosis not present

## 2021-09-07 LAB — RNP ANTIBODY: Ribonucleic Protein(ENA) Antibody, IgG: <1 AI

## 2021-09-07 LAB — ANTI-DNA ANTIBODY, DOUBLE-STRANDED: ds DNA Ab: <1 IU/mL

## 2021-09-07 LAB — ANTI-SMITH ANTIBODY: ENA SM Ab Ser-aCnc: <1 AI

## 2021-09-07 LAB — SJOGRENS SYNDROME-A EXTRACTABLE NUCLEAR ANTIBODY: SSA (Ro) (ENA) Antibody, IgG: <1 AI

## 2021-09-08 DIAGNOSIS — M542 Cervicalgia: Secondary | ICD-10-CM | POA: Diagnosis not present

## 2021-09-12 NOTE — Progress Notes (Signed)
We can cancel the scheduled f/u on 1/24.  FYI- I spoke with Ms. Sahota lab results are all negative for more specific antibody tests related to the positive ANA. I don't see anything concerning for autoimmune problem causing her foot pain. She will follow up with her orthopedist at [facility] for ongoing management.

## 2021-09-13 DIAGNOSIS — M542 Cervicalgia: Secondary | ICD-10-CM | POA: Diagnosis not present

## 2021-09-14 ENCOUNTER — Other Ambulatory Visit: Payer: Self-pay

## 2021-09-14 ENCOUNTER — Ambulatory Visit (AMBULATORY_SURGERY_CENTER): Payer: Medicare Other | Admitting: *Deleted

## 2021-09-14 VITALS — Ht 66.0 in | Wt 195.0 lb

## 2021-09-14 DIAGNOSIS — K259 Gastric ulcer, unspecified as acute or chronic, without hemorrhage or perforation: Secondary | ICD-10-CM

## 2021-09-14 DIAGNOSIS — K2970 Gastritis, unspecified, without bleeding: Secondary | ICD-10-CM

## 2021-09-14 DIAGNOSIS — Z86010 Personal history of colonic polyps: Secondary | ICD-10-CM

## 2021-09-14 DIAGNOSIS — K2990 Gastroduodenitis, unspecified, without bleeding: Secondary | ICD-10-CM

## 2021-09-14 MED ORDER — PLENVU 140 G PO SOLR: 1.0000 | ORAL | 0 refills | Status: DC

## 2021-09-14 MED ORDER — ONDANSETRON HCL 4 MG PO TABS: 4.0000 mg | ORAL_TABLET | ORAL | 0 refills | Status: DC

## 2021-09-14 NOTE — Progress Notes (Signed)
No egg or soy allergy known to patient  No issues known to pt with past sedation with any surgeries or procedures Patient denies ever being told they had issues or difficulty with intubation  No FH of Malignant Hyperthermia Pt is not on diet pills Pt is not on  home 02  Pt is not on blood thinners  Pt denies issues with constipation  No A fib or A flutter  Pt is fully vaccinated  for Covid   Plenvu Coupon to pt in PV today , Code to Pharmacy and  NO PA's for preps discussed with pt In PV today  Discussed with pt there will be an out-of-pocket cost for prep and that varies from $0 to 70 +  dollars - pt verbalized understanding   Due to the COVID-19 pandemic we are asking patients to follow certain guidelines in PV and the [place]   Pt aware of COVID protocols and [facility] guidelines   PV completed over the phone. Pt verified name, DOB, address and insurance during PV today.  Pt mailed instruction packet with copy of consent form to read and not return, and instructions.  Pt encouraged to call with questions or issues.  If pt has My chart, procedure instructions sent via My Chart

## 2021-09-15 ENCOUNTER — Other Ambulatory Visit: Payer: Self-pay | Admitting: Gastroenterology

## 2021-09-16 DIAGNOSIS — M542 Cervicalgia: Secondary | ICD-10-CM | POA: Diagnosis not present

## 2021-09-20 ENCOUNTER — Ambulatory Visit: Payer: Medicare Other | Admitting: Internal Medicine

## 2021-09-20 DIAGNOSIS — M542 Cervicalgia: Secondary | ICD-10-CM | POA: Diagnosis not present

## 2021-09-22 DIAGNOSIS — M542 Cervicalgia: Secondary | ICD-10-CM | POA: Diagnosis not present

## 2021-09-25 DIAGNOSIS — I10 Essential (primary) hypertension: Secondary | ICD-10-CM | POA: Diagnosis not present

## 2021-09-25 DIAGNOSIS — E785 Hyperlipidemia, unspecified: Secondary | ICD-10-CM | POA: Diagnosis not present

## 2021-09-25 DIAGNOSIS — E039 Hypothyroidism, unspecified: Secondary | ICD-10-CM | POA: Diagnosis not present

## 2021-09-25 DIAGNOSIS — I129 Hypertensive chronic kidney disease with stage 1 through stage 4 chronic kidney disease, or unspecified chronic kidney disease: Secondary | ICD-10-CM | POA: Diagnosis not present

## 2021-09-26 DIAGNOSIS — M2022 Hallux rigidus, left foot: Secondary | ICD-10-CM | POA: Diagnosis not present

## 2021-09-26 DIAGNOSIS — M19079 Primary osteoarthritis, unspecified ankle and foot: Secondary | ICD-10-CM | POA: Diagnosis not present

## 2021-09-26 DIAGNOSIS — M2021 Hallux rigidus, right foot: Secondary | ICD-10-CM | POA: Diagnosis not present

## 2021-09-27 DIAGNOSIS — M542 Cervicalgia: Secondary | ICD-10-CM | POA: Diagnosis not present

## 2021-09-28 ENCOUNTER — Ambulatory Visit (AMBULATORY_SURGERY_CENTER): Payer: Medicare Other | Admitting: Gastroenterology

## 2021-09-28 ENCOUNTER — Encounter: Payer: Self-pay | Admitting: Gastroenterology

## 2021-09-28 VITALS — BP 133/63 | HR 66 | Temp 97.5°F | Resp 12 | Ht 66.0 in | Wt 195.0 lb

## 2021-09-28 DIAGNOSIS — Z860100 Personal history of colon polyps, unspecified: Secondary | ICD-10-CM

## 2021-09-28 DIAGNOSIS — R1310 Dysphagia, unspecified: Secondary | ICD-10-CM | POA: Diagnosis not present

## 2021-09-28 DIAGNOSIS — Z8719 Personal history of other diseases of the digestive system: Secondary | ICD-10-CM | POA: Diagnosis not present

## 2021-09-28 DIAGNOSIS — N183 Chronic kidney disease, stage 3 unspecified: Secondary | ICD-10-CM | POA: Diagnosis not present

## 2021-09-28 DIAGNOSIS — I129 Hypertensive chronic kidney disease with stage 1 through stage 4 chronic kidney disease, or unspecified chronic kidney disease: Secondary | ICD-10-CM | POA: Diagnosis not present

## 2021-09-28 DIAGNOSIS — K259 Gastric ulcer, unspecified as acute or chronic, without hemorrhage or perforation: Secondary | ICD-10-CM

## 2021-09-28 HISTORY — PX: COLONOSCOPY WITH ESOPHAGOGASTRODUODENOSCOPY (EGD): SHX5779

## 2021-09-28 MED ORDER — SODIUM CHLORIDE 0.9 % IV SOLN: 500.0000 mL | Freq: Once | INTRAVENOUS | Status: DC

## 2021-09-28 NOTE — Progress Notes (Signed)
Pt's states no medical or surgical changes since previsit or office visit. VS by AS.

## 2021-09-28 NOTE — Op Note (Signed)
[facility] Patient Name: Katherine Greer Procedure Date: 09/28/2021 10:02 AM MRN: [id number] Endoscopist: Justice Britain , MD Age: 71 Referring MD:  Date of Birth: Jun 03, 1951 Gender: Female Account #: [id number] Procedure:                Upper GI endoscopy Indications:              Surveillance procedure, Dysphagia, Follow-up of                            gastric ulcer Medicines:                Monitored Anesthesia Care Procedure:                Pre-Anesthesia Assessment:                           - Prior to the procedure, a History and Physical                            was performed, and patient medications and                            allergies were reviewed. The patient's tolerance of                            previous anesthesia was also reviewed. The risks                            and benefits of the procedure and the sedation                            options and risks were discussed with the patient.                            All questions were answered, and informed consent                            was obtained. Prior Anticoagulants: The patient has                            taken no previous anticoagulant or antiplatelet                            agents except for aspirin. ASA Grade Assessment: II                            - A patient with mild systemic disease. After                            reviewing the risks and benefits, the patient was                            deemed in satisfactory condition to undergo the  procedure.                           After obtaining informed consent, the endoscope was                            passed under direct vision. Throughout the                            procedure, the patient's blood pressure, pulse, and                            oxygen saturations were monitored continuously. The                            Endoscope was introduced through the mouth, and                             advanced to the second part of duodenum. The upper                            GI endoscopy was accomplished without difficulty.                            The patient tolerated the procedure. Scope In: Scope Out: Findings:                 No gross lesions were noted in the entire esophagus.                           The Z-line was regular and was found 39 cm from the                            incisors.                           A 3 cm hiatal hernia was present.                           No gross lesions were noted in the entire examined                            stomach.                           No gross lesions were noted in the duodenal bulb,                            in the first portion of the duodenum and in the                            second portion of the duodenum. Complications:            No immediate complications. Estimated Blood Loss:     Estimated blood loss was minimal. Estimated blood  loss: none. Impression:               - No gross lesions in esophagus.                           - Z-line regular, 39 cm from the incisors.                           - 3 cm hiatal hernia.                           - No gross lesions in the stomach.                           - No gross lesions in the duodenal bulb, in the                            first portion of the duodenum and in the second                            portion of the duodenum. Recommendation:           - Proceed to scheduled colonoscopy.                           - Continue present medications.                           - Repeat upper endoscopy PRN for retreatment if                            dysphagia symptoms recur (she has had >1 year of                            improvement post dilation).                           - The findings and recommendations were discussed                            with the patient.                           - The findings and recommendations were  discussed                            with the patient's family. Justice Britain, MD 09/28/2021 11:04:12 AM

## 2021-09-28 NOTE — Patient Instructions (Signed)
Please read handouts provided. Continue present medications. High Fiber Diet. Use FiberCon 1-2 tablets daily.   YOU HAD AN ENDOSCOPIC PROCEDURE TODAY AT [facility]:   Refer to the procedure report that was given to you for any specific questions about what was found during the examination.  If the procedure report does not answer your questions, please call your gastroenterologist to clarify.  If you requested that your care partner not be given the details of your procedure findings, then the procedure report has been included in a sealed envelope for you to review at your convenience later.  YOU SHOULD EXPECT: Some feelings of bloating in the abdomen. Passage of more gas than usual.  Walking can help get rid of the air that was put into your GI tract during the procedure and reduce the bloating. If you had a lower endoscopy (such as a colonoscopy or flexible sigmoidoscopy) you may notice spotting of blood in your stool or on the toilet paper. If you underwent a bowel prep for your procedure, you may not have a normal bowel movement for a few days.  Please Note:  You might notice some irritation and congestion in your nose or some drainage.  This is from the oxygen used during your procedure.  There is no need for concern and it should clear up in a day or so.  SYMPTOMS TO REPORT IMMEDIATELY:  Following lower endoscopy (colonoscopy or flexible sigmoidoscopy):  Excessive amounts of blood in the stool  Significant tenderness or worsening of abdominal pains  Swelling of the abdomen that is new, acute  Fever of 100F or higher  Following upper endoscopy (EGD)  Vomiting of blood or coffee ground material  New chest pain or pain under the shoulder blades  Painful or persistently difficult swallowing  New shortness of breath  Fever of 100F or higher  Black, tarry-looking stools  For urgent or emergent issues, a gastroenterologist can be reached at any hour by calling [phone]  [phone]. Do not use MyChart messaging for urgent concerns.    DIET:  We do recommend a small meal at first, but then you may proceed to your regular diet.  Drink plenty of fluids but you should avoid alcoholic beverages for 24 hours.  ACTIVITY:  You should plan to take it easy for the rest of today and you should NOT DRIVE or use heavy machinery until tomorrow (because of the sedation medicines used during the test).    FOLLOW UP: Our staff will call the number listed on your records 48-72 hours following your procedure to check on you and address any questions or concerns that you may have regarding the information given to you following your procedure. If we do not reach you, we will leave a message.  We will attempt to reach you two times.  During this call, we will ask if you have developed any symptoms of COVID 19. If you develop any symptoms (ie: fever, flu-like symptoms, shortness of breath, cough etc.) before then, please call [phone].  If you test positive for Covid 19 in the 2 weeks post procedure, please call and report this information to [place].    If any biopsies were taken you will be contacted by phone or by letter within the next 1-3 weeks.  Please call us at [phone] if you have not heard about the biopsies in 3 weeks.    SIGNATURES/CONFIDENTIALITY: You and/or your care partner have signed paperwork which will be entered into your electronic  medical record.  These signatures attest to the fact that that the information above on your After Visit Summary has been reviewed and is understood.  Full responsibility of the confidentiality of this discharge information lies with you and/or your care-partner.

## 2021-09-28 NOTE — Progress Notes (Signed)
Report to PACU, RN, vss, BBS= Clear.  

## 2021-09-28 NOTE — Op Note (Addendum)
[facility] Patient Name: Katherine Greer Procedure Date: 09/28/2021 10:02 AM MRN: [id number] Endoscopist: Justice Britain , MD Age: 71 Referring MD:  Date of Birth: [birth year] Gender: Female Account #: [id number] Procedure:                Colonoscopy Indications:              Surveillance: Personal history of adenomatous                            polyps on last colonoscopy 3 years ago Medicines:                Monitored Anesthesia Care Procedure:                Pre-Anesthesia Assessment:                           - Prior to the procedure, a History and Physical                            was performed, and patient medications and                            allergies were reviewed. The patient's tolerance of                            previous anesthesia was also reviewed. The risks                            and benefits of the procedure and the sedation                            options and risks were discussed with the patient.                            All questions were answered, and informed consent                            was obtained. Prior Anticoagulants: The patient has                            taken no previous anticoagulant or antiplatelet                            agents except for aspirin. ASA Grade Assessment: II                            - A patient with mild systemic disease. After                            reviewing the risks and benefits, the patient was                            deemed in satisfactory condition to undergo the  procedure.                           After obtaining informed consent, the colonoscope                            was passed under direct vision. Throughout the                            procedure, the patient's blood pressure, pulse, and                            oxygen saturations were monitored continuously. The                            CF [id number] #[id number] was introduced through the anus                             and advanced to the the cecum, identified by                            appendiceal orifice and ileocecal valve. Due to a                            suction malfunction we switch adult colonoscopes.                            The CF [id number] #[id number] was introduced through the                            anus and advanced to the the cecum, identified by                            appendiceal orifice and ileocecal valve. The                            colonoscopy was performed without difficulty. The                            patient tolerated the procedure. The quality of the                            bowel preparation was adequate. The ileocecal                            valve, appendiceal orifice, and rectum were                            photographed. Scope In: 10:27:16 AM Scope Out: 10:58:36 AM Scope Withdrawal Time: 0 hours 22 minutes 41 seconds  Total Procedure Duration: 0 hours 31 minutes 20 seconds  Findings:                 The digital rectal exam findings include  hemorrhoids. Pertinent negatives include no                            palpable rectal lesions.                           A moderate amount of liquid stool was found in the                            entire colon, interfering with visualization.                            Lavage of the area was performed using copious                            amounts, resulting in clearance with adequate                            visualization. It did require [place] to change out                            scopes due to suction malfunction.                           Many small-mouthed diverticula were found in the                            recto-sigmoid colon and sigmoid colon.                           Normal mucosa was found in the entire colon                            otherwise.                           Non-bleeding non-thrombosed external and internal                             hemorrhoids were found during retroflexion, during                            perianal exam and during digital exam. The                            hemorrhoids were Grade II (internal hemorrhoids                            that prolapse but reduce spontaneously). Complications:            No immediate complications. Estimated Blood Loss:     Estimated blood loss: none. Impression:               - Hemorrhoids found on digital rectal exam.                           -  Stool in the entire examined colon.                           - Diverticulosis in the recto-sigmoid colon and in                            the sigmoid colon.                           - Normal mucosa in the entire examined colon.                           - Non-bleeding non-thrombosed external and internal                            hemorrhoids. Recommendation:           - The patient will be observed post-procedure,                            until all discharge criteria are met.                           - Discharge patient to home.                           - Patient has a contact number available for                            emergencies. The signs and symptoms of potential                            delayed complications were discussed with the                            patient. Return to normal activities tomorrow.                            Written discharge instructions were provided to the                            patient.                           - High fiber diet.                           - Use FiberCon 1-2 tablets PO daily.                           - Consider Anusol suppositories in future if                            hemorrhoids are bothering patient.                           - Continue present medications.                           -  Repeat colonoscopy in 7 years for surveillance                            due to prior history of adenomatous colon polyps.                            You will be 77 at  that time, so we should see you                            in clinic to discuss whether we continue                            colonscopies or whether you would not want to                            continue procedures at that point.                           - The findings and recommendations were discussed                            with the patient.                           - The findings and recommendations were discussed                            with the patient's family. Justice Britain, MD 09/28/2021 11:08:41 AM

## 2021-09-28 NOTE — Progress Notes (Signed)
GASTROENTEROLOGY PROCEDURE H&P NOTE   Primary Care Physician: Crist Infante, MD  HPI: Katherine Greer is a 71 y.o. female who presents for EGD for follow up of prior esophagitis and gastric ulcers and prior dysphagia with prior dilation; Colonoscopy for surveillance of prior adenomas last in 2019.  Past Medical History:  Diagnosis Date   Abdominal bloating 05/03/2018   Allergy    Arthritis    Asthma    Cataract    CHF (congestive heart failure) (HCC)    Chronic kidney disease    25% function in the right kidney   Colon polyp    GERD (gastroesophageal reflux disease)    Heart murmur    Hypertension    Osteopenia    Pill dysphagia 05/03/2018   SOB (shortness of breath) 05/03/2018   Thyroid disease    UTI (urinary tract infection)    Past Surgical History:  Procedure Laterality Date   FOOT SURGERY Bilateral    PERIPHERAL VASCULAR INTERVENTION Right 04/30/2019   Procedure: PERIPHERAL VASCULAR INTERVENTION;  Surgeon: Marty Heck, MD;  Location: [facility];  Service: Cardiovascular;  Laterality: Right;  Renal artery   RENAL ANGIOGRAPHY Right 04/30/2019   Procedure: RENAL ANGIOGRAPHY;  Surgeon: Marty Heck, MD;  Location: [facility];  Service: Cardiovascular;  Laterality: Right;   ROTATOR CUFF REPAIR Right    Current Outpatient Medications  Medication Sig Dispense Refill   acetaminophen (TYLENOL) 500 MG tablet Take 1,000 mg by mouth every 6 (six) hours as needed for mild pain or moderate pain.      aspirin EC 81 MG tablet Take 81 mg by mouth daily. Swallow whole.     atorvastatin (LIPITOR) 40 MG tablet Take 1 tablet (40 mg total) by mouth at bedtime. 90 tablet 3   Cholecalciferol 2000 units CAPS Take 2,000 Units by mouth at bedtime.      famotidine (PEPCID) 20 MG tablet TAKE 1 TABLET BY MOUTH TWICE DAILY 90 tablet 3   fluticasone (FLONASE) 50 MCG/ACT nasal spray Place 1 spray into both nostrils daily as needed for allergies or rhinitis.      furosemide (LASIX) 40 MG tablet Take 1 tablet (40 mg total) by mouth as needed. 30 tablet 6   gabapentin (NEURONTIN) 100 MG capsule Take 100 mg by mouth 2 (two) times daily.     halobetasol (ULTRAVATE) 0.05 % cream Apply 1 application topically 2 (two) times daily as needed (dermatitis).      ibandronate (BONIVA) 150 MG tablet Take 150 mg by mouth every 30 (thirty) days.     ketoconazole (NIZORAL) 2 % shampoo Apply topically 2 (two) times a week.     levothyroxine (SYNTHROID, LEVOTHROID) 112 MCG tablet Take 112 mcg by mouth daily before breakfast.     nicotine polacrilex (COMMIT) 4 MG lozenge Take 4 mg by mouth as needed for smoking cessation.     omeprazole (PRILOSEC) 40 MG capsule Take 1 capsule (40 mg total) by mouth daily. 90 capsule 3   ondansetron (ZOFRAN) 4 MG tablet Take 1 tablet (4 mg total) by mouth as directed. Take 1 tablet 30-60 minutes prior to each colonoscopy prep dose 2 tablet 0   OZEMPIC, 0.25 OR 0.5 MG/DOSE, 2 MG/1.5ML SOPN Inject into the skin.     PEG-KCl-NaCl-NaSulf-Na Asc-C (PLENVU) 140 g SOLR Take 1 kit by mouth as directed. 1 each 0   spironolactone (ALDACTONE) 25 MG tablet TAKE 1 TABLET(25 MG) BY MOUTH TWICE DAILY 180 tablet 3  tolterodine (DETROL LA) 4 MG 24 hr capsule Take 4 mg by mouth daily.     triamcinolone cream (KENALOG) 0.1 % Apply 1 application topically 2 (two) times daily as needed (dermatitis).     Current Facility-Administered Medications  Medication Dose Route Frequency Provider Last Rate Last Admin   0.9 %  sodium chloride infusion  500 mL Intravenous Once Mansouraty, Telford Nab., MD        Current Outpatient Medications:    acetaminophen (TYLENOL) 500 MG tablet, Take 1,000 mg by mouth every 6 (six) hours as needed for mild pain or moderate pain. , Disp: , Rfl:    aspirin EC 81 MG tablet, Take 81 mg by mouth daily. Swallow whole., Disp: , Rfl:    atorvastatin (LIPITOR) 40 MG tablet, Take 1 tablet (40 mg total) by mouth at bedtime., Disp: 90 tablet,  Rfl: 3   Cholecalciferol 2000 units CAPS, Take 2,000 Units by mouth at bedtime. , Disp: , Rfl:    famotidine (PEPCID) 20 MG tablet, TAKE 1 TABLET BY MOUTH TWICE DAILY, Disp: 90 tablet, Rfl: 3   fluticasone (FLONASE) 50 MCG/ACT nasal spray, Place 1 spray into both nostrils daily as needed for allergies or rhinitis., Disp: , Rfl:    furosemide (LASIX) 40 MG tablet, Take 1 tablet (40 mg total) by mouth as needed., Disp: 30 tablet, Rfl: 6   gabapentin (NEURONTIN) 100 MG capsule, Take 100 mg by mouth 2 (two) times daily., Disp: , Rfl:    halobetasol (ULTRAVATE) 0.05 % cream, Apply 1 application topically 2 (two) times daily as needed (dermatitis). , Disp: , Rfl:    ibandronate (BONIVA) 150 MG tablet, Take 150 mg by mouth every 30 (thirty) days., Disp: , Rfl:    ketoconazole (NIZORAL) 2 % shampoo, Apply topically 2 (two) times a week., Disp: , Rfl:    levothyroxine (SYNTHROID, LEVOTHROID) 112 MCG tablet, Take 112 mcg by mouth daily before breakfast., Disp: , Rfl:    nicotine polacrilex (COMMIT) 4 MG lozenge, Take 4 mg by mouth as needed for smoking cessation., Disp: , Rfl:    omeprazole (PRILOSEC) 40 MG capsule, Take 1 capsule (40 mg total) by mouth daily., Disp: 90 capsule, Rfl: 3   ondansetron (ZOFRAN) 4 MG tablet, Take 1 tablet (4 mg total) by mouth as directed. Take 1 tablet 30-60 minutes prior to each colonoscopy prep dose, Disp: 2 tablet, Rfl: 0   OZEMPIC, 0.25 OR 0.5 MG/DOSE, 2 MG/1.5ML SOPN, Inject into the skin., Disp: , Rfl:    PEG-KCl-NaCl-NaSulf-Na Asc-C (PLENVU) 140 g SOLR, Take 1 kit by mouth as directed., Disp: 1 each, Rfl: 0   spironolactone (ALDACTONE) 25 MG tablet, TAKE 1 TABLET(25 MG) BY MOUTH TWICE DAILY, Disp: 180 tablet, Rfl: 3   tolterodine (DETROL LA) 4 MG 24 hr capsule, Take 4 mg by mouth daily., Disp: , Rfl:    triamcinolone cream (KENALOG) 0.1 %, Apply 1 application topically 2 (two) times daily as needed (dermatitis)., Disp: , Rfl:   Current Facility-Administered Medications:     0.9 %  sodium chloride infusion, 500 mL, Intravenous, Once, Mansouraty, Telford Nab., MD Allergies  Allergen Reactions   Augmentin [Amoxicillin-Pot Clavulanate] Anaphylaxis and Rash    Did it involve swelling of the face/tongue/throat, SOB, or low BP? Yes Did it involve sudden or severe rash/hives, skin peeling, or any reaction on the inside of your mouth or nose? Yes Did you need to seek medical attention at a hospital or doctor's office? No When did it last happen?  18 years If all above answers are "NO", may proceed with cephalosporin use.    Keflex [Cephalexin] Anaphylaxis   Levaquin [Levofloxacin In D5w] Anaphylaxis   Sulfa Antibiotics Anaphylaxis and Rash   Amoxicillin    Carvedilol Other (See Comments)    Pt reports carvedilol causes her hair loss.    Cephalosporins     Other reaction(s): Other (See Comments) NO REACTION STATED.   Contrast Media [Iodinated Contrast Media]     Lowered kidney function    Depakote Er [Divalproex Sodium Er] Nausea Only   Latex Itching   Nickel Hives    Topical dermatitis sees Dr. Wilhemina Bonito    Nsaids     Avoid due to kidney function    Tobramycin Swelling    redness   Topamax [Topiramate]     unknown   Tramadol     Per pt this causes flushing and facial swelling   Family History  Problem Relation Age of Onset   Heart attack Mother    Diabetes Father    Chronic Renal Failure Father    Lung cancer Father    Breast cancer Sister    Lung cancer Sister    Diabetes Brother    Cancer Son        Pituitary- Metastatic Brain   Colon cancer Neg Hx    Esophageal cancer Neg Hx    Inflammatory bowel disease Neg Hx    Liver disease Neg Hx    Pancreatic cancer Neg Hx    Stomach cancer Neg Hx    Rectal cancer Neg Hx    Colon polyps Neg Hx    Social History   Socioeconomic History   Marital status: Married    Spouse name: Not on file   Number of children: 2   Years of education: Not on file   Highest education level: Not on file   Occupational History   Occupation: Teacher, 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  Tobacco Use   Smoking status: Former    Packs/day: 1.00    Years: 40.00    Pack years: 40.00    Types: Cigarettes    Quit date: 2012    Years since quitting: 11.0   Smokeless tobacco: Never   Tobacco comments:    Quit 2015, uses nicotine lozenges  Vaping Use   Vaping Use: Never used  Substance and Sexual Activity   Alcohol use: Yes    Comment: wine 4 times a week   Drug use: Never   Sexual activity: Yes  Other Topics Concern   Not on file  Social History Narrative   Not on file   Social Determinants of Health   Financial Resource Strain: Not on file  Food Insecurity: Not on file  Transportation Needs: Not on file  Physical Activity: Not on file  Stress: Not on file  Social Connections: Not on file  Intimate Partner Violence: Not on file    Physical Exam: There were no vitals filed for this visit. There is no height or weight on file to calculate BMI. GEN: NAD EYE: Sclerae anicteric ENT: MMM CV: Non-tachycardic GI: Soft, NT/ND NEURO:  Alert & Oriented x 3  Lab Results: No results for input(s): WBC, HGB, HCT, PLT in the last 72 hours. BMET No results for input(s): NA, K, CL, CO2, GLUCOSE, BUN, CREATININE, CALCIUM in the last 72 hours. LFT No results for input(s): PROT, ALBUMIN, AST, ALT, ALKPHOS, BILITOT, BILIDIR, IBILI in the last 72 hours. PT/INR No results for  input(s): LABPROT, INR in the last 72 hours.   Impression / Plan: This is a 71 y.o.female who presents for EGD for follow up of prior esophagitis and gastric ulcers and prior dysphagia with prior dilation; Colonoscopy for surveillance of prior adenomas last in 2019.  The risks and benefits of endoscopic evaluation/treatment were discussed with the patient and/or family; these include but are not limited to the risk of perforation, infection, bleeding, missed lesions, lack of diagnosis, severe illness requiring hospitalization,  as well as anesthesia and sedation related illnesses.  The patient's history has been reviewed, patient examined, no change in status, and deemed stable for procedure.  The patient and/or family is agreeable to proceed.    Justice Britain, MD [facility] Office # [phone]

## 2021-09-30 ENCOUNTER — Telehealth: Payer: Self-pay | Admitting: *Deleted

## 2021-09-30 NOTE — Telephone Encounter (Signed)
Attempted f/u phone call. No answer. Left message. °

## 2021-09-30 NOTE — Telephone Encounter (Signed)
°  Follow up Call-  Call back number 09/28/2021 09/07/2020  Post procedure Call Back phone  # [phone]  Permission to leave phone message Yes Yes  Some recent data might be hidden     Patient questions:  Do you have a fever, pain , or abdominal swelling? No. Pain Score  0 *  Have you tolerated food without any problems? Yes.    Have you been able to return to your normal activities? Yes.    Do you have any questions about your discharge instructions: Diet   No. Medications  No. Follow up visit  No.  Do you have questions or concerns about your Care? No.  Actions: * If pain score is 4 or above: No action needed, pain <4.

## 2021-10-11 DIAGNOSIS — M542 Cervicalgia: Secondary | ICD-10-CM | POA: Diagnosis not present

## 2021-10-25 DIAGNOSIS — E039 Hypothyroidism, unspecified: Secondary | ICD-10-CM | POA: Diagnosis not present

## 2021-10-25 DIAGNOSIS — M19071 Primary osteoarthritis, right ankle and foot: Secondary | ICD-10-CM | POA: Diagnosis not present

## 2021-10-25 DIAGNOSIS — E785 Hyperlipidemia, unspecified: Secondary | ICD-10-CM | POA: Diagnosis not present

## 2021-10-25 DIAGNOSIS — I129 Hypertensive chronic kidney disease with stage 1 through stage 4 chronic kidney disease, or unspecified chronic kidney disease: Secondary | ICD-10-CM | POA: Diagnosis not present

## 2021-10-25 DIAGNOSIS — M7741 Metatarsalgia, right foot: Secondary | ICD-10-CM | POA: Diagnosis not present

## 2021-10-25 DIAGNOSIS — I10 Essential (primary) hypertension: Secondary | ICD-10-CM | POA: Diagnosis not present

## 2021-10-25 DIAGNOSIS — M542 Cervicalgia: Secondary | ICD-10-CM | POA: Diagnosis not present

## 2021-10-25 DIAGNOSIS — M19072 Primary osteoarthritis, left ankle and foot: Secondary | ICD-10-CM | POA: Diagnosis not present

## 2021-10-25 DIAGNOSIS — M7742 Metatarsalgia, left foot: Secondary | ICD-10-CM | POA: Diagnosis not present

## 2021-11-08 DIAGNOSIS — M542 Cervicalgia: Secondary | ICD-10-CM | POA: Diagnosis not present

## 2021-11-09 DIAGNOSIS — J029 Acute pharyngitis, unspecified: Secondary | ICD-10-CM | POA: Diagnosis not present

## 2021-11-09 DIAGNOSIS — R062 Wheezing: Secondary | ICD-10-CM | POA: Diagnosis not present

## 2021-11-09 DIAGNOSIS — R5383 Other fatigue: Secondary | ICD-10-CM | POA: Diagnosis not present

## 2021-11-09 DIAGNOSIS — R059 Cough, unspecified: Secondary | ICD-10-CM | POA: Diagnosis not present

## 2021-11-09 DIAGNOSIS — Z1152 Encounter for screening for COVID-19: Secondary | ICD-10-CM | POA: Diagnosis not present

## 2021-11-09 DIAGNOSIS — R0981 Nasal congestion: Secondary | ICD-10-CM | POA: Diagnosis not present

## 2021-11-09 DIAGNOSIS — J069 Acute upper respiratory infection, unspecified: Secondary | ICD-10-CM | POA: Diagnosis not present

## 2021-11-18 DIAGNOSIS — Z20822 Contact with and (suspected) exposure to covid-19: Secondary | ICD-10-CM | POA: Diagnosis not present

## 2021-11-22 DIAGNOSIS — M542 Cervicalgia: Secondary | ICD-10-CM | POA: Diagnosis not present

## 2021-11-25 ENCOUNTER — Other Ambulatory Visit: Payer: Self-pay | Admitting: Gastroenterology

## 2021-11-25 DIAGNOSIS — E039 Hypothyroidism, unspecified: Secondary | ICD-10-CM | POA: Diagnosis not present

## 2021-11-25 DIAGNOSIS — E785 Hyperlipidemia, unspecified: Secondary | ICD-10-CM | POA: Diagnosis not present

## 2021-11-25 DIAGNOSIS — I10 Essential (primary) hypertension: Secondary | ICD-10-CM | POA: Diagnosis not present

## 2021-11-25 DIAGNOSIS — I129 Hypertensive chronic kidney disease with stage 1 through stage 4 chronic kidney disease, or unspecified chronic kidney disease: Secondary | ICD-10-CM | POA: Diagnosis not present

## 2021-11-29 DIAGNOSIS — M542 Cervicalgia: Secondary | ICD-10-CM | POA: Diagnosis not present

## 2021-12-20 DIAGNOSIS — M542 Cervicalgia: Secondary | ICD-10-CM | POA: Diagnosis not present

## 2021-12-23 DIAGNOSIS — M2022 Hallux rigidus, left foot: Secondary | ICD-10-CM | POA: Diagnosis not present

## 2021-12-23 DIAGNOSIS — M2021 Hallux rigidus, right foot: Secondary | ICD-10-CM | POA: Diagnosis not present

## 2021-12-23 DIAGNOSIS — M19072 Primary osteoarthritis, left ankle and foot: Secondary | ICD-10-CM | POA: Diagnosis not present

## 2021-12-25 DIAGNOSIS — E785 Hyperlipidemia, unspecified: Secondary | ICD-10-CM | POA: Diagnosis not present

## 2021-12-25 DIAGNOSIS — I10 Essential (primary) hypertension: Secondary | ICD-10-CM | POA: Diagnosis not present

## 2021-12-25 DIAGNOSIS — I129 Hypertensive chronic kidney disease with stage 1 through stage 4 chronic kidney disease, or unspecified chronic kidney disease: Secondary | ICD-10-CM | POA: Diagnosis not present

## 2021-12-25 DIAGNOSIS — E039 Hypothyroidism, unspecified: Secondary | ICD-10-CM | POA: Diagnosis not present

## 2021-12-26 DIAGNOSIS — Z20822 Contact with and (suspected) exposure to covid-19: Secondary | ICD-10-CM | POA: Diagnosis not present

## 2022-01-05 DIAGNOSIS — H35371 Puckering of macula, right eye: Secondary | ICD-10-CM | POA: Diagnosis not present

## 2022-01-05 DIAGNOSIS — H26493 Other secondary cataract, bilateral: Secondary | ICD-10-CM | POA: Diagnosis not present

## 2022-01-05 DIAGNOSIS — H53001 Unspecified amblyopia, right eye: Secondary | ICD-10-CM | POA: Diagnosis not present

## 2022-01-10 DIAGNOSIS — R7301 Impaired fasting glucose: Secondary | ICD-10-CM | POA: Diagnosis not present

## 2022-01-10 DIAGNOSIS — I10 Essential (primary) hypertension: Secondary | ICD-10-CM | POA: Diagnosis not present

## 2022-01-10 DIAGNOSIS — N1831 Chronic kidney disease, stage 3a: Secondary | ICD-10-CM | POA: Diagnosis not present

## 2022-01-10 DIAGNOSIS — E039 Hypothyroidism, unspecified: Secondary | ICD-10-CM | POA: Diagnosis not present

## 2022-01-10 DIAGNOSIS — M810 Age-related osteoporosis without current pathological fracture: Secondary | ICD-10-CM | POA: Diagnosis not present

## 2022-01-10 DIAGNOSIS — E785 Hyperlipidemia, unspecified: Secondary | ICD-10-CM | POA: Diagnosis not present

## 2022-01-11 DIAGNOSIS — Z1231 Encounter for screening mammogram for malignant neoplasm of breast: Secondary | ICD-10-CM | POA: Diagnosis not present

## 2022-01-11 DIAGNOSIS — M542 Cervicalgia: Secondary | ICD-10-CM | POA: Diagnosis not present

## 2022-01-12 DIAGNOSIS — Z0000 Encounter for general adult medical examination without abnormal findings: Secondary | ICD-10-CM | POA: Diagnosis not present

## 2022-01-17 DIAGNOSIS — E785 Hyperlipidemia, unspecified: Secondary | ICD-10-CM | POA: Diagnosis not present

## 2022-01-17 DIAGNOSIS — Z0000 Encounter for general adult medical examination without abnormal findings: Secondary | ICD-10-CM | POA: Diagnosis not present

## 2022-01-17 DIAGNOSIS — R82998 Other abnormal findings in urine: Secondary | ICD-10-CM | POA: Diagnosis not present

## 2022-01-17 DIAGNOSIS — N3281 Overactive bladder: Secondary | ICD-10-CM | POA: Diagnosis not present

## 2022-01-17 DIAGNOSIS — J449 Chronic obstructive pulmonary disease, unspecified: Secondary | ICD-10-CM | POA: Diagnosis not present

## 2022-01-17 DIAGNOSIS — Z1331 Encounter for screening for depression: Secondary | ICD-10-CM | POA: Diagnosis not present

## 2022-01-17 DIAGNOSIS — M810 Age-related osteoporosis without current pathological fracture: Secondary | ICD-10-CM | POA: Diagnosis not present

## 2022-01-17 DIAGNOSIS — I2510 Atherosclerotic heart disease of native coronary artery without angina pectoris: Secondary | ICD-10-CM | POA: Diagnosis not present

## 2022-01-17 DIAGNOSIS — E039 Hypothyroidism, unspecified: Secondary | ICD-10-CM | POA: Diagnosis not present

## 2022-01-17 DIAGNOSIS — Z1389 Encounter for screening for other disorder: Secondary | ICD-10-CM | POA: Diagnosis not present

## 2022-01-17 DIAGNOSIS — F17209 Nicotine dependence, unspecified, with unspecified nicotine-induced disorders: Secondary | ICD-10-CM | POA: Diagnosis not present

## 2022-01-17 DIAGNOSIS — N1831 Chronic kidney disease, stage 3a: Secondary | ICD-10-CM | POA: Diagnosis not present

## 2022-01-17 DIAGNOSIS — M5030 Other cervical disc degeneration, unspecified cervical region: Secondary | ICD-10-CM | POA: Diagnosis not present

## 2022-01-17 DIAGNOSIS — R7301 Impaired fasting glucose: Secondary | ICD-10-CM | POA: Diagnosis not present

## 2022-01-17 DIAGNOSIS — Z23 Encounter for immunization: Secondary | ICD-10-CM | POA: Diagnosis not present

## 2022-01-17 DIAGNOSIS — I129 Hypertensive chronic kidney disease with stage 1 through stage 4 chronic kidney disease, or unspecified chronic kidney disease: Secondary | ICD-10-CM | POA: Diagnosis not present

## 2022-01-18 ENCOUNTER — Other Ambulatory Visit: Payer: Self-pay | Admitting: Internal Medicine

## 2022-01-18 DIAGNOSIS — F17209 Nicotine dependence, unspecified, with unspecified nicotine-induced disorders: Secondary | ICD-10-CM

## 2022-01-31 DIAGNOSIS — M542 Cervicalgia: Secondary | ICD-10-CM | POA: Diagnosis not present

## 2022-02-01 DIAGNOSIS — D692 Other nonthrombocytopenic purpura: Secondary | ICD-10-CM | POA: Diagnosis not present

## 2022-02-01 DIAGNOSIS — D225 Melanocytic nevi of trunk: Secondary | ICD-10-CM | POA: Diagnosis not present

## 2022-02-01 DIAGNOSIS — L720 Epidermal cyst: Secondary | ICD-10-CM | POA: Diagnosis not present

## 2022-02-01 DIAGNOSIS — L738 Other specified follicular disorders: Secondary | ICD-10-CM | POA: Diagnosis not present

## 2022-02-01 DIAGNOSIS — L570 Actinic keratosis: Secondary | ICD-10-CM | POA: Diagnosis not present

## 2022-02-01 DIAGNOSIS — L821 Other seborrheic keratosis: Secondary | ICD-10-CM | POA: Diagnosis not present

## 2022-02-15 ENCOUNTER — Ambulatory Visit
Admission: RE | Admit: 2022-02-15 | Discharge: 2022-02-15 | Disposition: A | Discharge status: Home / Self Care | Payer: Medicare Other | Source: Ambulatory Visit | Attending: Internal Medicine | Admitting: Internal Medicine

## 2022-02-15 DIAGNOSIS — J432 Centrilobular emphysema: Secondary | ICD-10-CM | POA: Diagnosis not present

## 2022-02-15 DIAGNOSIS — Z87891 Personal history of nicotine dependence: Secondary | ICD-10-CM | POA: Diagnosis not present

## 2022-02-15 DIAGNOSIS — I2510 Atherosclerotic heart disease of native coronary artery without angina pectoris: Secondary | ICD-10-CM | POA: Diagnosis not present

## 2022-02-15 DIAGNOSIS — F17209 Nicotine dependence, unspecified, with unspecified nicotine-induced disorders: Secondary | ICD-10-CM

## 2022-02-15 DIAGNOSIS — K449 Diaphragmatic hernia without obstruction or gangrene: Secondary | ICD-10-CM | POA: Diagnosis not present

## 2022-02-24 DIAGNOSIS — I2510 Atherosclerotic heart disease of native coronary artery without angina pectoris: Secondary | ICD-10-CM | POA: Diagnosis not present

## 2022-02-24 DIAGNOSIS — I10 Essential (primary) hypertension: Secondary | ICD-10-CM | POA: Diagnosis not present

## 2022-02-24 DIAGNOSIS — R7301 Impaired fasting glucose: Secondary | ICD-10-CM | POA: Diagnosis not present

## 2022-03-08 DIAGNOSIS — N1831 Chronic kidney disease, stage 3a: Secondary | ICD-10-CM | POA: Diagnosis not present

## 2022-03-08 DIAGNOSIS — E669 Obesity, unspecified: Secondary | ICD-10-CM | POA: Diagnosis not present

## 2022-03-08 DIAGNOSIS — I129 Hypertensive chronic kidney disease with stage 1 through stage 4 chronic kidney disease, or unspecified chronic kidney disease: Secondary | ICD-10-CM | POA: Diagnosis not present

## 2022-03-08 DIAGNOSIS — I701 Atherosclerosis of renal artery: Secondary | ICD-10-CM | POA: Diagnosis not present

## 2022-03-13 ENCOUNTER — Other Ambulatory Visit: Payer: Self-pay

## 2022-03-13 DIAGNOSIS — R079 Chest pain, unspecified: Secondary | ICD-10-CM | POA: Diagnosis not present

## 2022-03-13 DIAGNOSIS — S4991XA Unspecified injury of right shoulder and upper arm, initial encounter: Secondary | ICD-10-CM | POA: Diagnosis not present

## 2022-03-13 DIAGNOSIS — I10 Essential (primary) hypertension: Secondary | ICD-10-CM | POA: Diagnosis not present

## 2022-03-13 DIAGNOSIS — I2510 Atherosclerotic heart disease of native coronary artery without angina pectoris: Secondary | ICD-10-CM

## 2022-03-13 DIAGNOSIS — S3991XA Unspecified injury of abdomen, initial encounter: Secondary | ICD-10-CM | POA: Diagnosis not present

## 2022-03-13 DIAGNOSIS — K429 Umbilical hernia without obstruction or gangrene: Secondary | ICD-10-CM | POA: Diagnosis not present

## 2022-03-13 DIAGNOSIS — S2231XA Fracture of one rib, right side, initial encounter for closed fracture: Secondary | ICD-10-CM | POA: Diagnosis not present

## 2022-03-13 DIAGNOSIS — K5730 Diverticulosis of large intestine without perforation or abscess without bleeding: Secondary | ICD-10-CM | POA: Diagnosis not present

## 2022-03-13 DIAGNOSIS — T1490XA Injury, unspecified, initial encounter: Secondary | ICD-10-CM | POA: Diagnosis not present

## 2022-03-13 DIAGNOSIS — M25511 Pain in right shoulder: Secondary | ICD-10-CM | POA: Diagnosis not present

## 2022-03-13 DIAGNOSIS — K449 Diaphragmatic hernia without obstruction or gangrene: Secondary | ICD-10-CM | POA: Diagnosis not present

## 2022-03-13 DIAGNOSIS — M5134 Other intervertebral disc degeneration, thoracic region: Secondary | ICD-10-CM | POA: Diagnosis not present

## 2022-03-13 DIAGNOSIS — S299XXA Unspecified injury of thorax, initial encounter: Secondary | ICD-10-CM | POA: Diagnosis not present

## 2022-03-13 MED ORDER — SPIRONOLACTONE 25 MG PO TABS: ORAL_TABLET | ORAL | 0 refills | Status: DC

## 2022-04-03 ENCOUNTER — Other Ambulatory Visit: Payer: Self-pay | Admitting: Gastroenterology

## 2022-04-03 ENCOUNTER — Telehealth: Payer: Self-pay | Admitting: Gastroenterology

## 2022-04-03 ENCOUNTER — Other Ambulatory Visit: Payer: Self-pay | Admitting: *Deleted

## 2022-04-03 DIAGNOSIS — I10 Essential (primary) hypertension: Secondary | ICD-10-CM

## 2022-04-03 DIAGNOSIS — I2510 Atherosclerotic heart disease of native coronary artery without angina pectoris: Secondary | ICD-10-CM

## 2022-04-03 MED ORDER — ATORVASTATIN CALCIUM 40 MG PO TABS: 40.0000 mg | ORAL_TABLET | Freq: Every day | ORAL | 0 refills | Status: DC

## 2022-04-03 NOTE — Telephone Encounter (Signed)
Patient called states she has been having a lot of stool leakage and is seeking advise.

## 2022-04-04 ENCOUNTER — Telehealth: Payer: Self-pay | Admitting: Cardiology

## 2022-04-04 DIAGNOSIS — I10 Essential (primary) hypertension: Secondary | ICD-10-CM

## 2022-04-04 DIAGNOSIS — I2510 Atherosclerotic heart disease of native coronary artery without angina pectoris: Secondary | ICD-10-CM

## 2022-04-04 MED ORDER — ATORVASTATIN CALCIUM 40 MG PO TABS: 40.0000 mg | ORAL_TABLET | Freq: Every day | ORAL | 0 refills | Status: DC

## 2022-04-04 NOTE — Telephone Encounter (Signed)
Tried again to reach the pt and had to leave a message

## 2022-04-04 NOTE — Telephone Encounter (Signed)
Left message on machine to call back  

## 2022-04-04 NOTE — Telephone Encounter (Signed)
*  STAT* If patient is at the pharmacy, call can be transferred to refill team.   1. Which medications need to be refilled? (please list name of each medication and dose if known) atorvastatin (LIPITOR) 40 MG tablet  2. Which pharmacy/location (including street and city if local pharmacy) is medication to be sent to? [place], [address]  3. Do they need a 30 day or 90 day supply? Pt states they made appt with Dr. Johney Frame for 10/06 and they need 90 day refill for this med for insurance.

## 2022-04-04 NOTE — Telephone Encounter (Signed)
Refill has been sent in.  

## 2022-04-05 ENCOUNTER — Other Ambulatory Visit: Payer: Self-pay

## 2022-04-05 DIAGNOSIS — I10 Essential (primary) hypertension: Secondary | ICD-10-CM

## 2022-04-05 DIAGNOSIS — I2510 Atherosclerotic heart disease of native coronary artery without angina pectoris: Secondary | ICD-10-CM

## 2022-04-05 MED ORDER — SPIRONOLACTONE 25 MG PO TABS: ORAL_TABLET | ORAL | 2 refills | Status: DC

## 2022-04-05 NOTE — Telephone Encounter (Signed)
The pt has a history of constipation with stool leakage.  She has been told to take miralax daily but admits that she is not taking it regularly.  She has been advised to do 6 doses and purge her bowels and then take her miralax as prescribed. She has also been scheduled for a follow up with [facility] on 9/8.  She will call back if her symptoms worsen in the meantime.

## 2022-04-19 ENCOUNTER — Telehealth: Payer: Self-pay | Admitting: Cardiology

## 2022-04-19 DIAGNOSIS — E782 Mixed hyperlipidemia: Secondary | ICD-10-CM

## 2022-04-19 DIAGNOSIS — Z79899 Other long term (current) drug therapy: Secondary | ICD-10-CM

## 2022-04-19 DIAGNOSIS — I2510 Atherosclerotic heart disease of native coronary artery without angina pectoris: Secondary | ICD-10-CM

## 2022-04-19 NOTE — Telephone Encounter (Signed)
Will send this information to Dr. Johney Frame as a general FYI.

## 2022-04-19 NOTE — Telephone Encounter (Signed)
Pt c/o medication issue:  1. Name of Medication: Atorvastatin 80 mg atorvastatin (LIPITOR) 40 MG tablet  2. How are you currently taking this medication (dosage and times per day)? Not currently taking  3. Are you having a reaction (difficulty breathing--STAT)? No   4. What is your medication issue? Jon from [place] is calling stating the patient was distributed 80 mg tablets of atorvastatin instead of 40 mg tablets in error on 05/08. They were unaware of this until the patient picked up her 40 mg tablets on 08/11 and stated she was taking 80 mg tablets instead. Patient is now back on 40 mg tablets and did not report any reaction to the increased dosage. [facility] just wanted to make Dr. Johney Frame aware.

## 2022-04-20 NOTE — Telephone Encounter (Signed)
Message  This is perfect. There should be no issue at all with the increased dose expect maybe a little lower cholesterol which is always a good thing. We frequently prescribe lipitor '80mg'$  daily for people so she did not exceed the recommended max amount. Will follow-up her labs.

## 2022-04-20 NOTE — Telephone Encounter (Signed)
Spoke with the pt.  She has been mistakenly taking her atorvastatin incorrectly, despite having the correct dosage written on her pill bottle.   Pt states she has been taking atorvastatin 80 mg po daily vs prescribed dose of 40 mg po daily, since the beginning of May.  She states she didn't even look at the bottle with dosing instructions.  She said that her PCP added another med around that time and also though he said for her to double up on her statin.  She misunderstood.   She reports she's had no interactions or side effects since taking increased dose.  Pt is wandering if there is any "ramifications" with taking increased dose.  Advised the pt that we will check her LFTs and lipids tomorrow, if she is available to do so.   Pt agreed to the appt.  Scheduled her to come into the office tomorrow 8/25 to check LFTs and lipids.  She is aware to come fasting to this lab appt.  She is aware this is to ensure stability of her liver function with increased dose.  She is aware we will also assess lipids to see how she responded, and being she hasn't had those checked in quite sometime.  She is aware we will provide her further dosing instructions of her statin, once those results come in.   Pt verbalized understanding and agrees with this plan.  Pt was more than gracious for all the assistance provided.   Will route this message to Dr. Johney Frame as an Juluis Rainier, to make her aware of this plan.

## 2022-04-20 NOTE — Telephone Encounter (Signed)
Patient calling. She would like to know if there are any ramifications from her taking '80mg'$  instead of '40mg'$ , and if there is anything she needs to do.

## 2022-04-21 ENCOUNTER — Other Ambulatory Visit: Payer: Medicare Other

## 2022-04-21 DIAGNOSIS — I2510 Atherosclerotic heart disease of native coronary artery without angina pectoris: Secondary | ICD-10-CM | POA: Diagnosis not present

## 2022-04-21 DIAGNOSIS — Z79899 Other long term (current) drug therapy: Secondary | ICD-10-CM | POA: Diagnosis not present

## 2022-04-21 DIAGNOSIS — E782 Mixed hyperlipidemia: Secondary | ICD-10-CM

## 2022-04-21 LAB — HEPATIC FUNCTION PANEL
ALT: 20 IU/L (ref 0–32)
AST: 18 IU/L (ref 0–40)
Albumin: 4.2 g/dL (ref 3.8–4.8)
Alkaline Phosphatase: 127 IU/L — ABNORMAL HIGH (ref 44–121)
Bilirubin Total: 0.8 mg/dL (ref 0.0–1.2)
Bilirubin, Direct: 0.19 mg/dL (ref 0.00–0.40)
Total Protein: 6.2 g/dL (ref 6.0–8.5)

## 2022-04-21 LAB — LIPID PANEL
Chol/HDL Ratio: 2.5 ratio (ref 0.0–4.4)
Cholesterol, Total: 113 mg/dL (ref 100–199)
HDL: 45 mg/dL (ref >39)
LDL Chol Calc (NIH): 47 mg/dL (ref 0–99)
Triglycerides: 119 mg/dL (ref 0–149)
VLDL Cholesterol Cal: 21 mg/dL (ref 5–40)

## 2022-04-24 ENCOUNTER — Telehealth: Payer: Self-pay | Admitting: *Deleted

## 2022-04-24 MED ORDER — ATORVASTATIN CALCIUM 80 MG PO TABS: 80.0000 mg | ORAL_TABLET | Freq: Every day | ORAL | 3 refills | Status: DC

## 2022-04-24 NOTE — Telephone Encounter (Signed)
The patient has been notified of the result and verbalized understanding.  All questions (if any) were answered.  Pt aware that being her cholesterol responded so well on mistaken increased atorvastatin 80 mg po daily (pt had been taking this dose incorrectly  vs taking just 40 mg daily since May and these labs ordered for this reason), Dr. Johney Frame advised that she continue taking that dose, but we will call in the 80 mg tablets for her to take.  So pt is aware she will take atorvastatin 80 mg po daily, and when she picks this up, she should only take 1 tablet to get this equivalent.  Confirmed the pharmacy of choice with the pt. Pt verbalized understanding and agrees with this plan.

## 2022-04-24 NOTE — Telephone Encounter (Signed)
-----   Message from Freada Bergeron, MD sent at 04/24/2022  8:12 AM EDT ----- Cholesterol looks excellent. Liver function test look stable.

## 2022-05-05 ENCOUNTER — Encounter: Payer: Self-pay | Admitting: Nurse Practitioner

## 2022-05-05 ENCOUNTER — Ambulatory Visit (INDEPENDENT_AMBULATORY_CARE_PROVIDER_SITE_OTHER): Payer: Medicare Other | Admitting: Nurse Practitioner

## 2022-05-05 VITALS — BP 100/68 | HR 84 | Ht 66.0 in | Wt 185.4 lb

## 2022-05-05 DIAGNOSIS — K5900 Constipation, unspecified: Secondary | ICD-10-CM

## 2022-05-05 DIAGNOSIS — K649 Unspecified hemorrhoids: Secondary | ICD-10-CM

## 2022-05-05 DIAGNOSIS — K625 Hemorrhage of anus and rectum: Secondary | ICD-10-CM | POA: Diagnosis not present

## 2022-05-05 NOTE — Progress Notes (Signed)
05/05/2022 Katherine Greer [id number] June 14, 1951   Chief Complaint: Hemorrhoids, constipation   History of Present Illness: Katherine Greer is a 71 year old female with a past medical history of arthritis, hypertension, nonobstructive coronary artery disease per CTA 2020, CHF with LV EF 60 to 65%, CKD stage III, thyroid disease, GERD and colon polyps.  Right renal artery stenosis s/p right renal stent placement by Dr. Monica Martinez 04/30/2019.  She presents today with complaints of having hemorrhoidal symptoms.  She has intermittent hemorrhoidal discomfort and sees a small amount of bright red blood on the toilet tissue.  She has intermittent constipation, tries not to strain.  She takes MiraLAX every other day, sometimes takes MiraLAX for 2 consecutive days when needed to results in passing a formed bowel movement the next day.  She uses an Anusol suppository for 2 to 3 days every few weeks.  She eats a high-fiber diet.  She is not actively exercising. She takes Miralax every other day, sometimes 2 consecutive days which results in passing. She eats a high fiber diet. She is not exercising due to having foot pain.  Her most recent colonoscopy was 09/28/2021 which identified diverticulosis in the rectosigmoid and sigmoid colon and nonbleeding internal and external hemorrhoids.  A repeat colonoscopy in 7 years was recommended if medically appropriate at that time as she will be 71 years old at that point.  Her GERD symptoms are well controlled on omeprazole 40 mg once daily.     Latest Ref Rng & Units 10/23/2019    7:57 AM 07/01/2019   10:53 AM 04/30/2019    9:17 AM  CBC  WBC 3.4 - 10.8 x10E3/uL 8.0  6.0    Hemoglobin 11.1 - 15.9 g/dL 13.3  12.9  15.0   Hematocrit 34.0 - 46.6 % 39.6  36.9  44.0   Platelets 150 - 450 x10E3/uL 308  295         Latest Ref Rng & Units 04/21/2022   10:47 AM 10/23/2019    7:57 AM 07/01/2019   10:53 AM  CMP  Glucose 65 - 99 mg/dL  112  97   BUN 8 - 27 mg/dL  16  13    Creatinine 0.57 - 1.00 mg/dL  1.26  1.26   Sodium 134 - 144 mmol/L  142  140   Potassium 3.5 - 5.2 mmol/L  4.7  4.8   Chloride 96 - 106 mmol/L  105  105   CO2 20 - 29 mmol/L  21  22   Calcium 8.7 - 10.3 mg/dL  9.2  9.3   Total Protein 6.0 - 8.5 g/dL 6.2  6.2  6.1   Total Bilirubin 0.0 - 1.2 mg/dL 0.8  0.5  0.7   Alkaline Phos 44 - 121 IU/L 127  128  139   AST 0 - 40 IU/L '18  15  26   '$ ALT 0 - 32 IU/L 20  14  44     Colonoscopy 09/28/2021: - Hemorrhoids found on digital rectal exam. - A moderate amount of liquid stool was found in the entire colon, interfering with visualization. Lavage of the area was performed using copious amounts, resulting in clearance with with adequate visualization. - Diverticulosis in the recto-sigmoid colon and in the sigmoid colon. - Normal mucosa in the entire examined colon. - Non-bleeding non-thrombosed external and internal hemorrhoids. - 7 year colonoscopy recall  EGD 09/28/2021: - No gross lesions in esophagus. - Z-line regular, 39 cm from the incisors. -  3 cm hiatal hernia. - No gross lesions in the stomach. - No gross lesions in the duodenal bulb, in the first portion of the duodenum and in the second portion of the duodenum.  Colonoscopy 07/03/2018:  - Skin tags were found on perianal exam. - The digital rectal exam was abnormal. Pertinent negatives include no palpable rectal lesions. - The terminal ileum and ileocecal valve appeared normal. - Six sessile polyps were found in the recto-sigmoid colon, ascending colon and cecum. The polyps were 1 to 6 mm in size. These polyps were removed with a cold snare. Resection and retrieval were complete. - A 8 mm polyp was found in the recto-sigmoid colon. The polyp was sessile. The polyp was removed with a hot snare. Resection and retrieval were complete. - Multiple small-mouthed diverticula were found in the recto-sigmoid colon, sigmoid colon and descending colon. -3 year recall Surgical [P], cecal,  ascending, rectosigmoid, polyp (7) - TUBULAR ADENOMA(S). - HYPERPLASTIC POLYP(S) (TWO FRAGMENTS) - HIGH GRADE DYSPLASIA IS NOT IDENTIFIED.   Colonoscopy 8/10/162016 in [place]: 5 polyps removed via forceps or hot snare and pathology reviewed suggested that she had multiple tubular adenomas as well as multiple sessile serrated polyps    Past Medical History:  Diagnosis Date   Abdominal bloating 05/03/2018   Allergy    Arthritis    Asthma    Cataract    CHF (congestive heart failure) (HCC)    Chronic kidney disease    25% function in the right kidney   Colon polyp    GERD (gastroesophageal reflux disease)    Heart murmur    Hypertension    Osteopenia    Pill dysphagia 05/03/2018   SOB (shortness of breath) 05/03/2018   Thyroid disease    UTI (urinary tract infection)    Past Surgical History:  Procedure Laterality Date   COLONOSCOPY WITH ESOPHAGOGASTRODUODENOSCOPY (EGD)  09/28/2021   Mansouraty   FOOT SURGERY Bilateral    PERIPHERAL VASCULAR INTERVENTION Right 04/30/2019   Procedure: PERIPHERAL VASCULAR INTERVENTION;  Surgeon: Marty Heck, MD;  Location: [place] [facility];  Service: Cardiovascular;  Laterality: Right;  Renal artery   RENAL ANGIOGRAPHY Right 04/30/2019   Procedure: RENAL ANGIOGRAPHY;  Surgeon: Marty Heck, MD;  Location: [place] [facility];  Service: Cardiovascular;  Laterality: Right;   ROTATOR CUFF REPAIR Right    Current Outpatient Medications on File Prior to Visit  Medication Sig Dispense Refill   acetaminophen (TYLENOL) 500 MG tablet Take 1,000 mg by mouth every 6 (six) hours as needed for mild pain or moderate pain.      aspirin EC 81 MG tablet Take 81 mg by mouth daily. Swallow whole.     atorvastatin (LIPITOR) 80 MG tablet Take 1 tablet (80 mg total) by mouth daily. 90 tablet 3   Cholecalciferol 2000 units CAPS Take 2,000 Units by mouth at bedtime.      famotidine (PEPCID) 20 MG tablet TAKE 1 TABLET BY MOUTH TWICE DAILY 90  tablet 3   fluticasone (FLONASE) 50 MCG/ACT nasal spray Place 1 spray into both nostrils daily as needed for allergies or rhinitis.     furosemide (LASIX) 40 MG tablet Take 1 tablet (40 mg total) by mouth as needed. 30 tablet 6   gabapentin (NEURONTIN) 100 MG capsule Take 100 mg by mouth 2 (two) times daily.     halobetasol (ULTRAVATE) 0.05 % cream Apply 1 application topically 2 (two) times daily as needed (dermatitis).      ibandronate (BONIVA)  150 MG tablet Take 150 mg by mouth every 30 (thirty) days.     ketoconazole (NIZORAL) 2 % shampoo Apply topically 2 (two) times a week.     levothyroxine (SYNTHROID, LEVOTHROID) 112 MCG tablet Take 112 mcg by mouth daily before breakfast.     nicotine polacrilex (COMMIT) 4 MG lozenge Take 4 mg by mouth as needed for smoking cessation.     omeprazole (PRILOSEC) 40 MG capsule TAKE 1 CAPSULE BY MOUTH EVERY DAY 90 capsule 3   OZEMPIC, 0.25 OR 0.5 MG/DOSE, 2 MG/1.5ML SOPN Inject into the skin.     spironolactone (ALDACTONE) 25 MG tablet TAKE 1 TABLET(25 MG) BY MOUTH TWICE DAILY 60 tablet 2   tolterodine (DETROL LA) 4 MG 24 hr capsule Take 4 mg by mouth daily.     triamcinolone cream (KENALOG) 0.1 % Apply 1 application topically 2 (two) times daily as needed (dermatitis).     Current Facility-Administered Medications on File Prior to Visit  Medication Dose Route Frequency Provider Last Rate Last Admin   0.9 %  sodium chloride infusion  500 mL Intravenous Once Mansouraty, Telford Nab., MD       Allergies  Allergen Reactions   Augmentin [Amoxicillin-Pot Clavulanate] Anaphylaxis and Rash    Did it involve swelling of the face/tongue/throat, SOB, or low BP? Yes Did it involve sudden or severe rash/hives, skin peeling, or any reaction on the inside of your mouth or nose? Yes Did you need to seek medical attention at a hospital or doctor's office? No When did it last happen?      18 years If all above answers are "NO", may proceed with cephalosporin use.     Keflex [Cephalexin] Anaphylaxis   Levaquin [Levofloxacin In D5w] Anaphylaxis   Sulfa Antibiotics Anaphylaxis and Rash   Carvedilol Other (See Comments)    Pt reports carvedilol causes her hair loss.    Cephalosporins     Other reaction(s): Other (See Comments) NO REACTION STATED.   Contrast Media [Iodinated Contrast Media]     Lowered kidney function    Depakote Er [Divalproex Sodium Er] Nausea Only   Latex Itching   Nickel Hives    Topical dermatitis sees Dr. Wilhemina Bonito    Nsaids     Avoid due to kidney function    Tobramycin Swelling    redness   Topamax [Topiramate]     unknown   Tramadol     Per pt this causes flushing and facial swelling    Current Medications, Allergies, Past Medical History, Past Surgical History, Family History and Social History were reviewed in [employer] record.  Review of Systems:   Constitutional: Negative for fever, sweats, chills or weight loss.  Respiratory: Negative for shortness of breath.   Cardiovascular: Negative for chest pain, palpitations and leg swelling.  Gastrointestinal: See HPI.  Musculoskeletal: Negative for back pain or muscle aches.  Neurological: Negative for dizziness, headaches or paresthesias.   Physical Exam: BP 100/68   Pulse 84   Ht '5\' 6"'$  (1.676 m)   Wt 185 lb 6 oz (84.1 kg)   BMI 29.92 kg/m  General: 71 year old female in no acute distress. Head: Normocephalic and atraumatic. Eyes: No scleral icterus. Conjunctiva pink . Ears: Normal auditory acuity. Mouth: Dentition intact. No ulcers or lesions.  Lungs: Clear throughout to auscultation. Heart: Regular rate and rhythm, no murmur. Abdomen: Soft, nontender and nondistended. No masses or hepatomegaly. Normal bowel sounds x 4 quadrants.  Rectal: No significant external hemorrhoids. Internal  hemorrhoids without prolapse. Anal hemorrhoids with minor erythema. No blood, stool or mass in the rectal vault. Shell CMA present during  exam. Musculoskeletal: Symmetrical with no gross deformities. Extremities: No edema. Neurological: Alert oriented x 4. No focal deficits.  Psychological: Alert and cooperative. Normal mood and affect  Assessment and Recommendations:  77) 71 year old female with remittent rectal bleeding, likely from hemorrhoids -Patient to have labs with her PCP -Take Miralax 1 capful mixed in 8 ounces of water at bed time for constipation as tolerated. -Apply a small amount of Desitin inside the anal opening and to the external anal area tid as needed for anal or hemorrhoidal irritation/bleeding.  -Anusol suppository 1 PR nightly for 3-5 consecutive nights as needed -Patient to contact office if symptoms worsen -May consider internal hemorrhoid banding if symptoms persist  2) Constipation -MiraLAX is ordered above  3) History of colon polyps.  No polyps per colonoscopy 09/2021 -Consider colonoscopy in 7 years if medically appropriate  4) GERD, stable -Continue omeprazole 40 mg once daily and famotidine 20 mg twice daily

## 2022-05-05 NOTE — Patient Instructions (Addendum)
  If you are age 73 or older, your body mass index should be between 23-30. Your Body mass index is 29.92 kg/m. If this is out of the aforementioned range listed, please consider follow up with your Primary Care Provider.  If you are age 22 or younger, your body mass index should be between 19-25. Your Body mass index is 29.92 kg/m. If this is out of the aformentioned range listed, please consider follow up with your Primary Care Provider.   The [facility] providers would like to encourage you to use [facility] to communicate with providers for non-urgent requests or questions.  Due to long hold times on the telephone, sending your provider a message by [facility] may be a faster and more efficient way to get a response.  Please allow 48 business hours for a response.  Please remember that this is for non-urgent requests.    Apply a small amount of Desitin inside the anal opening and to the external anal area tid as needed for anal or hemorrhoidal irritation/bleeding.   May use Anusol suppository one per the rectum at bed time for 3 to 5 consecutive nights as needed   Take Miralax 1 capful mixed in 8 ounces of water at bed time for constipation as tolerated  Contact our office if no improvement and we will schedule an appointment for internal hemorrhoid banding   It was a pleasure to see you today!  Thank you for trusting me with your gastrointestinal care!

## 2022-05-06 NOTE — Progress Notes (Signed)
Attending Physician's Attestation   I have reviewed the chart.   I agree with the Advanced Practitioner's note, impression, and recommendations with any updates as below.    Matilde Markie Mansouraty, MD [facility] Office # [phone]  

## 2022-05-10 DIAGNOSIS — Z23 Encounter for immunization: Secondary | ICD-10-CM | POA: Diagnosis not present

## 2022-05-19 DIAGNOSIS — Z23 Encounter for immunization: Secondary | ICD-10-CM | POA: Diagnosis not present

## 2022-05-31 NOTE — Progress Notes (Deleted)
Cardiology Office Note:    Date:  05/31/2022   ID:  Katherine Greer, DOB [birth year], MRN [id number]  PCP:  Crist Infante, MD   [facility] Providers Cardiologist:  Ena Dawley, MD {  Referring MD: Crist Infante, MD    History of Present Illness:    Katherine Greer is a 71 y.o. female with a long history of intractable headaches and extensive work-up at [facility], hypertension, HLD, CKD stage III, chronic diastolic heart failure, RAS s/p stenting in 04/30/2019 and mild nonobstructive CAD on coronary CTA 2020 who was previously followed by Dr. Meda Coffee who now presentes to clinic for follow-up.  Last saw Dr. Meda Coffee 02/13/20 where she was doing well. Exercising without anginal symptoms. Blood pressure had improved since renal artery stenting. Last TTE 08/2018 LVEF 60 to 65% with grade 1 DD and increased filling pressures.   Was last seen 02/2021 where she was doing well from a CV standpoint.  Today, ***   Past Medical History:  Diagnosis Date   Abdominal bloating 05/03/2018   Allergy    Arthritis    Asthma    Cataract    CHF (congestive heart failure) (HCC)    Chronic kidney disease    25% function in the right kidney   Colon polyp    GERD (gastroesophageal reflux disease)    Heart murmur    Hypertension    Osteopenia    Pill dysphagia 05/03/2018   SOB (shortness of breath) 05/03/2018   Thyroid disease    UTI (urinary tract infection)     Past Surgical History:  Procedure Laterality Date   COLONOSCOPY WITH ESOPHAGOGASTRODUODENOSCOPY (EGD)  09/28/2021   Mansouraty   FOOT SURGERY Bilateral    PERIPHERAL VASCULAR INTERVENTION Right 04/30/2019   Procedure: PERIPHERAL VASCULAR INTERVENTION;  Surgeon: Marty Heck, MD;  Location: [place] [facility];  Service: Cardiovascular;  Laterality: Right;  Renal artery   RENAL ANGIOGRAPHY Right 04/30/2019   Procedure: RENAL ANGIOGRAPHY;  Surgeon: Marty Heck, MD;  Location: [place] [facility];  Service: Cardiovascular;  Laterality:  Right;   ROTATOR CUFF REPAIR Right     Current Medications: No outpatient medications have been marked as taking for the 06/02/22 encounter (Appointment) with Freada Bergeron, MD.   Current Facility-Administered Medications for the 06/02/22 encounter (Appointment) with Freada Bergeron, MD  Medication   0.9 %  sodium chloride infusion     Allergies:   Augmentin [amoxicillin-pot clavulanate], Keflex [cephalexin], Levaquin [levofloxacin in d5w], Sulfa antibiotics, Carvedilol, Cephalosporins, Contrast media [iodinated contrast media], Depakote er [divalproex sodium er], Latex, Nickel, Nsaids, Tobramycin, Topamax [topiramate], and Tramadol   Social History   Socioeconomic History   Marital status: Married    Spouse name: Not on file   Number of children: 2   Years of education: Not on file   Highest education level: Not on file  Occupational History   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  Tobacco Use   Smoking status: Former    Packs/day: 1.00    Years: 40.00    Total pack years: 40.00    Types: Cigarettes    Quit date: 2012    Years since quitting: 11.7   Smokeless tobacco: Never   Tobacco comments:    Quit 2015, uses nicotine lozenges  Vaping Use   Vaping Use: Never used  Substance and Sexual Activity   Alcohol use: Yes    Comment: wine 4 times a week   Drug use: Never   Sexual  activity: Yes  Other Topics Concern   Not on file  Social History Narrative   Not on file   Social Determinants of Health   Financial Resource Strain: Not on file  Food Insecurity: Not on file  Transportation Needs: Not on file  Physical Activity: Not on file  Stress: Not on file  Social Connections: Not on file     Family History: The patient's family history includes Breast cancer in her sister; Cancer in her son; Chronic Renal Failure in her father; Diabetes in her brother and father; Heart attack in her mother; Lung cancer in her father and sister. There is no  history of Colon cancer, Esophageal cancer, Inflammatory bowel disease, Liver disease, Pancreatic cancer, Stomach cancer, Rectal cancer, or Colon polyps.  ROS:   Review of Systems  Constitutional:  Negative for fever and weight loss.  HENT:  Negative for ear discharge and hearing loss.   Eyes:  Negative for double vision and discharge.  Respiratory:  Negative for cough, hemoptysis and sputum production.   Cardiovascular:  Negative for chest pain, palpitations, orthopnea, claudication, leg swelling and PND.  Gastrointestinal:  Negative for constipation and diarrhea.  Genitourinary:  Negative for flank pain and urgency.  Musculoskeletal:  Negative for myalgias and neck pain.  Neurological:  Negative for speech change, loss of consciousness and headaches.  Endo/Heme/Allergies:  Negative for polydipsia.  Psychiatric/Behavioral:  Negative for suicidal ideas. The patient does not have insomnia.      EKGs/Labs/Other Studies Reviewed:    The following studies were reviewed today: Renal artery ultrasound 03/2021: Summary:  Renal:     Right: Abnormal cortical thickness of right kidney. Abnormal right         Resistive Index. 1-59% stenosis of the distal right renal         artery.   2D echo 1/2020TTE: 08/2018   Left ventricle: The cavity size was normal. There was mild   concentric hypertrophy. Systolic function was normal. The   estimated ejection fraction was in the range of 60% to 65%. Wall   motion was normal; there were no regional wall motion   abnormalities. Doppler parameters are consistent with abnormal   left ventricular relaxation (grade 1 diastolic dysfunction).   Doppler parameters are consistent with elevated ventricular   end-diastolic filling pressure. - Aortic valve: There was mild regurgitation. - Right ventricle: The cavity size was normal. Wall thickness was   normal. Systolic function was normal. - Right atrium: The atrium was normal in size. - Tricuspid valve:  There was trivial regurgitation. - Pulmonary arteries: Systolic pressure was within the normal   range. - Inferior vena cava: The vessel was normal in size. - Pericardium, extracardiac: There was no pericardial effusion.   Coronary CTA: 09/2018 1. Coronary calcium score of 46. This was 80 percentile for age and sex matched control. 2. Normal coronary origin with right dominance. 3. Minimal diffuse CAD.  Risk factor modification is recommended.    EKG:  03/08/2021: NSR, HR 79 bpm  Recent Labs: 04/21/2022: ALT 20  Recent Lipid Panel    Component Value Date/Time   CHOL 113 04/21/2022 1047   TRIG 119 04/21/2022 1047   HDL 45 04/21/2022 1047   CHOLHDL 2.5 04/21/2022 1047   LDLCALC 47 04/21/2022 1047     Risk Assessment/Calculations:           Physical Exam:    VS:  There were no vitals taken for this visit.    Wt Readings from Last  3 Encounters:  05/05/22 185 lb 6 oz (84.1 kg)  09/28/21 195 lb (88.5 kg)  09/14/21 195 lb (88.5 kg)     GEN: Well nourished, well developed in no acute distress HEENT: Normal NECK: No JVD; No carotid bruits CARDIAC: RRR, no murmurs, rubs, gallops RESPIRATORY:  Clear to auscultation without rales, wheezing or rhonchi  ABDOMEN: Soft, non-tender, non-distended MUSCULOSKELETAL:  No edema; No deformity  SKIN: Warm and dry NEUROLOGIC:  Alert and oriented x 3 PSYCHIATRIC:  Normal affect   ASSESSMENT:    No diagnosis found.  PLAN:    In order of problems listed above:  #Chronic Diastolic Heart Failure: TTE in 2020 with normal LVEF, G1DD. Currently appears compensated and euvolemic on examination. Taking lasix about 1x/2 weeks.  -Continue lasix '40mg'$  daily as needed -Continue spiro '25mg'$  daily  -Low Na diet  #Mild Non-Obstructive CAD: No anginal symptoms. -Continue ASA '81mg'$  daily -Continue lipitor '40mg'$  daily  #HLD: -Continue lipitor '40mg'$  daily -Lipids monitored by PCP  #Renal Artery Stenosis s/p stenting: Doing well, blood  pressure controlled.  -Continue lipitor '40mg'$  daily -Continue spironolactone as above  #CKD Stage IIIA: ***  #Obesity: BMI 31. Discussed diet and weight loss at length today. Wants to think about possible ozemic/wegovy. May be interested in intermittent fasting. -Trial of intermittent fasting with mediterranean diet -May consider GLP-1 agonist in future  Exercise recommendations: Goal of exercising for at least 30 minutes a day, at least 5 times per week.  Please exercise to a moderate exertion.  This means that while exercising it is difficult to speak in full sentences, however you are not so short of breath that you feel you must stop, and not so comfortable that you can carry on a full conversation.  Exertion level should be approximately a 5/10, if 10 is the most exertion you can perform.  Diet recommendations: Recommend a heart healthy diet such as the Mediterranean diet.  This diet consists of plant based foods, healthy fats, lean meats, olive oil.  It suggests limiting the intake of simple carbohydrates such as white breads, pastries, and pastas.  It also limits the amount of red meat, wine, and dairy products such as cheese that one should consume on a daily basis.     Follow-up in 1 year.  Medication Adjustments/Labs and Tests Ordered: Current medicines are reviewed at length with the patient today.  Concerns regarding medicines are outlined above.  No orders of the defined types were placed in this encounter.  No orders of the defined types were placed in this encounter.   There are no Patient Instructions on file for this visit.     Signed, Freada Bergeron, MD  05/31/2022 9:00 PM    [place]

## 2022-06-02 ENCOUNTER — Ambulatory Visit: Payer: Medicare Other | Admitting: Cardiology

## 2022-07-04 NOTE — Progress Notes (Unsigned)
Cardiology Office Note:    Date:  07/06/2022   ID:  Katherine Greer, DOB 17-Nov-1950, MRN [id number]  PCP:  Crist Infante, MD   [facility] Providers Cardiologist:  Ena Dawley, MD {  Referring MD: Crist Infante, MD    History of Present Illness:    Katherine Greer is a 71 y.o. 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 Meda Coffee who now presentes to clinic for follow-up.  Last saw Dr. Meda Coffee 02/13/20 where she was doing well. Exercising without anginal symptoms. Blood pressure had improved since renal artery stenting. Last TTE 08/2018 LVEF 60 to 65% with grade 1 DD and increased filling pressures.   Was last seen on 02/2021 where she was doing well from a CV standpoint. Remained active.  Today, ***   Past Medical History:  Diagnosis Date   Abdominal bloating 05/03/2018   Allergy    Arthritis    Asthma    Cataract    CHF (congestive heart failure) (HCC)    Chronic kidney disease    25% function in the right kidney   Colon polyp    GERD (gastroesophageal reflux disease)    Heart murmur    Hypertension    Osteopenia    Pill dysphagia 05/03/2018   SOB (shortness of breath) 05/03/2018   Thyroid disease    UTI (urinary tract infection)     Past Surgical History:  Procedure Laterality Date   COLONOSCOPY WITH ESOPHAGOGASTRODUODENOSCOPY (EGD)  09/28/2021   [place]   FOOT SURGERY Bilateral    PERIPHERAL VASCULAR INTERVENTION Right 04/30/2019   Procedure: PERIPHERAL VASCULAR INTERVENTION;  Surgeon: Marty Heck, MD;  Location: [place] [facility];  Service: Cardiovascular;  Laterality: Right;  Renal artery   RENAL ANGIOGRAPHY Right 04/30/2019   Procedure: RENAL ANGIOGRAPHY;  Surgeon: Marty Heck, MD;  Location: [place] [facility];  Service:  Cardiovascular;  Laterality: Right;   ROTATOR CUFF REPAIR Right     Current Medications: Current Meds  Medication Sig   acetaminophen (TYLENOL) 500 MG tablet Take 1,000 mg by mouth every 6 (six) hours as needed for mild pain or moderate pain.    aspirin EC 81 MG tablet Take 81 mg by mouth daily. Swallow whole.   atorvastatin (LIPITOR) 80 MG tablet Take 1 tablet (80 mg total) by mouth daily.   Cholecalciferol 2000 units CAPS Take 2,000 Units by mouth at bedtime.    ezetimibe (ZETIA) 10 MG tablet Take 10 mg by mouth daily.   famotidine (PEPCID) 20 MG tablet TAKE 1 TABLET BY MOUTH TWICE DAILY   fluticasone (FLONASE) 50 MCG/ACT nasal spray Place 1 spray into both nostrils daily as needed for allergies or rhinitis.   furosemide (LASIX) 40 MG tablet Take 1 tablet (40 mg total) by mouth as needed.   gabapentin (NEURONTIN) 100 MG capsule Take 100 mg by mouth 2 (two) times daily.   halobetasol (ULTRAVATE) 0.05 % cream Apply 1 application topically 2 (two) times daily as needed (dermatitis).    ibandronate (BONIVA) 150 MG tablet Take 150 mg by mouth every 30 (thirty) days.   ketoconazole (NIZORAL) 2 % shampoo Apply topically 2 (two) times a week.   levothyroxine (SYNTHROID, LEVOTHROID) 112 MCG tablet Take 112 mcg by mouth daily before breakfast.   nicotine polacrilex (COMMIT) 4 MG lozenge Take 4  mg by mouth as needed for smoking cessation.   omeprazole (PRILOSEC) 40 MG capsule TAKE 1 CAPSULE BY MOUTH EVERY DAY   OZEMPIC, 2 MG/DOSE, 8 MG/3ML SOPN Inject 2 mg into the skin once a week.   spironolactone (ALDACTONE) 25 MG tablet TAKE 1 TABLET(25 MG) BY MOUTH TWICE DAILY   tolterodine (DETROL LA) 4 MG 24 hr capsule Take 4 mg by mouth daily.   triamcinolone cream (KENALOG) 0.1 % Apply 1 application topically 2 (two) times daily as needed (dermatitis).   Current Facility-Administered Medications for the 07/06/22 encounter (Office Visit) with Freada Bergeron, MD  Medication   0.9 %  sodium chloride  infusion     Allergies:   Augmentin [amoxicillin-pot clavulanate], Keflex [cephalexin], Levaquin [levofloxacin in d5w], Sulfa antibiotics, Carvedilol, Cephalosporins, Contrast media [iodinated contrast media], Depakote er [divalproex sodium er], Latex, Nickel, Nsaids, Tobramycin, Topamax [topiramate], and Tramadol   Social History   Socioeconomic History   Marital status: Married    Spouse name: Not on file   Number of children: 2   Years of education: Not on file   Highest education level: Not on file  Occupational History   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  Tobacco Use   Smoking status: Former    Packs/day: 1.00    Years: 40.00    Total pack years: 40.00    Types: Cigarettes    Quit date: 2012    Years since quitting: 11.8   Smokeless tobacco: Never   Tobacco comments:    Quit 2015, uses nicotine lozenges  Vaping Use   Vaping Use: Never used  Substance and Sexual Activity   Alcohol use: Yes    Comment: wine 4 times a week   Drug use: Never   Sexual activity: Yes  Other Topics Concern   Not on file  Social History Narrative   Not on file   Social Determinants of Health   Financial Resource Strain: Not on file  Food Insecurity: Not on file  Transportation Needs: Not on file  Physical Activity: Not on file  Stress: Not on file  Social Connections: Not on file     Family History: The patient's family history includes Breast cancer in her sister; Cancer in her son; Chronic Renal Failure in her father; Diabetes in her brother and father; Heart attack in her mother; Lung cancer in her father and sister. There is no history of Colon cancer, Esophageal cancer, Inflammatory bowel disease, Liver disease, Pancreatic cancer, Stomach cancer, Rectal cancer, or Colon polyps.  ROS:   Review of Systems  Constitutional:  Negative for fever and weight loss.  HENT:  Negative for ear discharge and hearing loss.   Eyes:  Negative for double vision and discharge.   Respiratory:  Negative for cough, hemoptysis and sputum production.   Cardiovascular:  Negative for chest pain, palpitations, orthopnea, claudication, leg swelling and PND.  Gastrointestinal:  Negative for constipation and diarrhea.  Genitourinary:  Negative for flank pain and urgency.  Musculoskeletal:  Negative for myalgias and neck pain.  Neurological:  Negative for speech change, loss of consciousness and headaches.  Endo/Heme/Allergies:  Negative for polydipsia.  Psychiatric/Behavioral:  Negative for suicidal ideas. The patient does not have insomnia.      EKGs/Labs/Other Studies Reviewed:    The following studies were reviewed today:  2D echo 1/2020TTE: 08/2018   Left ventricle: The cavity size was normal. There was mild   concentric hypertrophy. Systolic function was normal. The  estimated ejection fraction was in the range of 60% to 65%. Wall   motion was normal; there were no regional wall motion   abnormalities. Doppler parameters are consistent with abnormal   left ventricular relaxation (grade 1 diastolic dysfunction).   Doppler parameters are consistent with elevated ventricular   end-diastolic filling pressure. - Aortic valve: There was mild regurgitation. - Right ventricle: The cavity size was normal. Wall thickness was   normal. Systolic function was normal. - Right atrium: The atrium was normal in size. - Tricuspid valve: There was trivial regurgitation. - Pulmonary arteries: Systolic pressure was within the normal   range. - Inferior vena cava: The vessel was normal in size. - Pericardium, extracardiac: There was no pericardial effusion.   Coronary CTA: 09/2018 1. Coronary calcium score of 46. This was 70 percentile for age and sex matched control. 2. Normal coronary origin with right dominance. 3. Minimal diffuse CAD.  Risk factor modification is recommended.    EKG:  03/08/2021: NSR, HR 79 bpm  Recent Labs: 04/21/2022: ALT 20  Recent Lipid Panel     Component Value Date/Time   CHOL 113 04/21/2022 1047   TRIG 119 04/21/2022 1047   HDL 45 04/21/2022 1047   CHOLHDL 2.5 04/21/2022 1047   LDLCALC 47 04/21/2022 1047     Risk Assessment/Calculations:           Physical Exam:    VS:  BP 128/82   Pulse 92   Ht '5\' 6"'$  (1.676 m)   Wt 183 lb (83 kg)   SpO2 98%   BMI 29.54 kg/m     Wt Readings from Last 3 Encounters:  07/06/22 183 lb (83 kg)  05/05/22 185 lb 6 oz (84.1 kg)  09/28/21 195 lb (88.5 kg)     GEN: Well nourished, well developed in no acute distress HEENT: Normal NECK: No JVD; No carotid bruits CARDIAC: RRR, no murmurs, rubs, gallops RESPIRATORY:  Clear to auscultation without rales, wheezing or rhonchi  ABDOMEN: Soft, non-tender, non-distended MUSCULOSKELETAL:  No edema; No deformity  SKIN: Warm and dry NEUROLOGIC:  Alert and oriented x 3 PSYCHIATRIC:  Normal affect   ASSESSMENT:    1. Aortic valve insufficiency, etiology of cardiac valve disease unspecified   2. Coronary artery disease involving native coronary artery of native heart without angina pectoris   3. Mixed hyperlipidemia   4. Chronic diastolic CHF (congestive heart failure) ([place])   5. Renal artery stenosis ([place])   6. Essential hypertension   7. Stage 3a chronic kidney disease (HCC)    PLAN:    In order of problems listed above:  #Chronic Diastolic Heart Failure: TTE in 2020 with normal LVEF, G1DD. Currently appears compensated and euvolemic on examination. Taking lasix about 1x/2 weeks.  -Continue lasix '40mg'$  daily as needed -Continue spiro '25mg'$  daily  -Low Na diet  #Mild Non-Obstructive CAD: No anginal symptoms. -Continue ASA '81mg'$  daily -Continue lipitor '40mg'$  daily  #HLD: -Continue lipitor '40mg'$  daily -Lipids monitored by PCP  #Renal Artery Stenosis s/p stenting: Doing well, blood pressure controlled.  -Continue lipitor '40mg'$  daily -LDL controlled at 47  #CKD Stage IIIA: Follows with nephrology  #Obesity: -Continue  lifestyle modifications as below  Exercise recommendations: Goal of exercising for at least 30 minutes a day, at least 5 times per week.  Please exercise to a moderate exertion.  This means that while exercising it is difficult to speak in full sentences, however you are not so short of breath that you feel you must  stop, and not so comfortable that you can carry on a full conversation.  Exertion level should be approximately a 5/10, if 10 is the most exertion you can perform.  Diet recommendations: Recommend a heart healthy diet such as the Mediterranean diet.  This diet consists of plant based foods, healthy fats, lean meats, olive oil.  It suggests limiting the intake of simple carbohydrates such as white breads, pastries, and pastas.  It also limits the amount of red meat, wine, and dairy products such as cheese that one should consume on a daily basis.     Follow-up in 1 year.  Medication Adjustments/Labs and Tests Ordered: Current medicines are reviewed at length with the patient today.  Concerns regarding medicines are outlined above.  Orders Placed This Encounter  Procedures   EKG 12-Lead   ECHOCARDIOGRAM COMPLETE   No orders of the defined types were placed in this encounter.   Patient Instructions  Medication Instructions:   Your physician recommends that you continue on your current medications as directed. Please refer to the Current Medication list given to you today.  *If you need a refill on your cardiac medications before your next appointment, please call your pharmacy*   Testing/Procedures:  Your physician has requested that you have an echocardiogram. Echocardiography is a painless test that uses sound waves to create images of your heart. It provides your doctor with information about the size and shape of your heart and how well your heart's chambers and valves are working. This procedure takes approximately one hour. There are no restrictions for this procedure.  SCHEDULE ECHO TO BE DONE IN JANUARY 2024 PER DR. Johney Frame  Please do NOT wear cologne, perfume, aftershave, or lotions (deodorant is allowed). Please arrive 15 minutes prior to your appointment time.    Follow-Up: At [facility], you and your health needs are our priority.  As part of our continuing mission to provide you with exceptional heart care, we have created designated Provider Care Teams.  These Care Teams include your primary Cardiologist (physician) and Advanced Practice Providers (APPs -  Physician Assistants and Nurse Practitioners) who all work together to provide you with the care you need, when you need it.  We recommend signing up for the patient portal called "MyChart".  Sign up information is provided on this After Visit Summary.  MyChart is used to connect with patients for Virtual Visits (Telemedicine).  Patients are able to view lab/test results, encounter notes, upcoming appointments, etc.  Non-urgent messages can be sent to your provider as well.   To learn more about what you can do with MyChart, go to [email or web address].    Your next appointment:   6 month(s)  The format for your next appointment:   In Person  Provider:   DR. Johney Frame   Important Information About Sugar          Signed, Freada Bergeron, MD  07/06/2022 4:58 PM    [place]

## 2022-07-06 ENCOUNTER — Encounter: Payer: Self-pay | Admitting: Cardiology

## 2022-07-06 ENCOUNTER — Ambulatory Visit: Payer: Medicare Other | Attending: Cardiology | Admitting: Cardiology

## 2022-07-06 VITALS — BP 128/82 | HR 92 | Ht 66.0 in | Wt 183.0 lb

## 2022-07-06 DIAGNOSIS — I5032 Chronic diastolic (congestive) heart failure: Secondary | ICD-10-CM | POA: Insufficient documentation

## 2022-07-06 DIAGNOSIS — I701 Atherosclerosis of renal artery: Secondary | ICD-10-CM | POA: Diagnosis not present

## 2022-07-06 DIAGNOSIS — I10 Essential (primary) hypertension: Secondary | ICD-10-CM | POA: Diagnosis not present

## 2022-07-06 DIAGNOSIS — E782 Mixed hyperlipidemia: Secondary | ICD-10-CM | POA: Diagnosis not present

## 2022-07-06 DIAGNOSIS — I351 Nonrheumatic aortic (valve) insufficiency: Secondary | ICD-10-CM | POA: Insufficient documentation

## 2022-07-06 DIAGNOSIS — I2510 Atherosclerotic heart disease of native coronary artery without angina pectoris: Secondary | ICD-10-CM | POA: Diagnosis not present

## 2022-07-06 DIAGNOSIS — N1831 Chronic kidney disease, stage 3a: Secondary | ICD-10-CM | POA: Diagnosis not present

## 2022-07-06 NOTE — Progress Notes (Signed)
Cardiology Office Note:    Date:  07/06/2022   ID:  Katherine Greer, DOB Oct 18, 1950, MRN [id number]  PCP:  Crist Infante, MD   [facility] Providers Cardiologist:  Ena Dawley, MD {  Referring MD: Crist Infante, MD    History of Present Illness:    Katherine Greer is a 71 y.o. female with a long history of intractable headaches and extensive work-up at [facility], hypertension, HLD, CKD stage III, chronic diastolic heart failure, RAS s/p stenting in 04/30/2019 and mild nonobstructive CAD on coronary CTA 2020 who was previously followed by Dr. Meda Coffee who now presentes to clinic for follow-up.  Last saw Dr. Meda Coffee 02/13/20 where she was doing well. Exercising without anginal symptoms. Blood pressure had improved since renal artery stenting. Last TTE 08/2018 LVEF 60 to 65% with grade 1 DD and increased filling pressures.   Was last seen on 02/2021 where she was doing well from a CV standpoint. Remained active.  Today, the patient states that she is feeling well. She has remained active without anginal symptoms. No chest pain, SOB, LE edema or orthopnea. Renal function is stable and BP is well controlled.   Has occasional LE cramps at night but no significant myalgias during the day.  She denies any palpitations, chest pain, shortness of breath, or peripheral edema. No lightheadedness, headaches, syncope, orthopnea, or PND.   Past Medical History:  Diagnosis Date   Abdominal bloating 05/03/2018   Allergy    Arthritis    Asthma    Cataract    CHF (congestive heart failure) (HCC)    Chronic kidney disease    25% function in the right kidney   Colon polyp    GERD (gastroesophageal reflux disease)    Heart murmur    Hypertension    Osteopenia    Pill dysphagia 05/03/2018   SOB (shortness of breath) 05/03/2018   Thyroid disease    UTI (urinary tract infection)     Past Surgical History:  Procedure Laterality Date   COLONOSCOPY WITH ESOPHAGOGASTRODUODENOSCOPY (EGD)  09/28/2021   [place]    FOOT SURGERY Bilateral    PERIPHERAL VASCULAR INTERVENTION Right 04/30/2019   Procedure: PERIPHERAL VASCULAR INTERVENTION;  Surgeon: Marty Heck, MD;  Location: [place] [facility];  Service: Cardiovascular;  Laterality: Right;  Renal artery   RENAL ANGIOGRAPHY Right 04/30/2019   Procedure: RENAL ANGIOGRAPHY;  Surgeon: Marty Heck, MD;  Location: [place] [facility];  Service: Cardiovascular;  Laterality: Right;   ROTATOR CUFF REPAIR Right     Current Medications: Current Meds  Medication Sig   acetaminophen (TYLENOL) 500 MG tablet Take 1,000 mg by mouth every 6 (six) hours as needed for mild pain or moderate pain.    aspirin EC 81 MG tablet Take 81 mg by mouth daily. Swallow whole.   atorvastatin (LIPITOR) 80 MG tablet Take 1 tablet (80 mg total) by mouth daily.   Cholecalciferol 2000 units CAPS Take 2,000 Units by mouth at bedtime.    ezetimibe (ZETIA) 10 MG tablet Take 10 mg by mouth daily.   famotidine (PEPCID) 20 MG tablet TAKE 1 TABLET BY MOUTH TWICE DAILY   fluticasone (FLONASE) 50 MCG/ACT nasal spray Place 1 spray into both nostrils daily as needed for allergies or rhinitis.   furosemide (LASIX) 40 MG tablet Take 1 tablet (40 mg total) by mouth as needed.   gabapentin (NEURONTIN) 100 MG capsule Take 100 mg by mouth 2 (two) times daily.   halobetasol (ULTRAVATE) 0.05 % cream Apply 1  application topically 2 (two) times daily as needed (dermatitis).    ibandronate (BONIVA) 150 MG tablet Take 150 mg by mouth every 30 (thirty) days.   ketoconazole (NIZORAL) 2 % shampoo Apply topically 2 (two) times a week.   levothyroxine (SYNTHROID, LEVOTHROID) 112 MCG tablet Take 112 mcg by mouth daily before breakfast.   nicotine polacrilex (COMMIT) 4 MG lozenge Take 4 mg by mouth as needed for smoking cessation.   omeprazole (PRILOSEC) 40 MG capsule TAKE 1 CAPSULE BY MOUTH EVERY DAY   OZEMPIC, 2 MG/DOSE, 8 MG/3ML SOPN Inject 2 mg into the skin once a week.   spironolactone  (ALDACTONE) 25 MG tablet TAKE 1 TABLET(25 MG) BY MOUTH TWICE DAILY   tolterodine (DETROL LA) 4 MG 24 hr capsule Take 4 mg by mouth daily.   triamcinolone cream (KENALOG) 0.1 % Apply 1 application topically 2 (two) times daily as needed (dermatitis).   Current Facility-Administered Medications for the 07/06/22 encounter (Office Visit) with Freada Bergeron, MD  Medication   0.9 %  sodium chloride infusion     Allergies:   Augmentin [amoxicillin-pot clavulanate], Keflex [cephalexin], Levaquin [levofloxacin in d5w], Sulfa antibiotics, Carvedilol, Cephalosporins, Contrast media [iodinated contrast media], Depakote er [divalproex sodium er], Latex, Nickel, Nsaids, Tobramycin, Topamax [topiramate], and Tramadol   Social History   Socioeconomic History   Marital status: Married    Spouse name: Not on file   Number of children: 2   Years of education: Not on file   Highest education level: Not on file  Occupational History   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  Tobacco Use   Smoking status: Former    Packs/day: 1.00    Years: 40.00    Total pack years: 40.00    Types: Cigarettes    Quit date: 2012    Years since quitting: 11.8   Smokeless tobacco: Never   Tobacco comments:    Quit 2015, uses nicotine lozenges  Vaping Use   Vaping Use: Never used  Substance and Sexual Activity   Alcohol use: Yes    Comment: wine 4 times a week   Drug use: Never   Sexual activity: Yes  Other Topics Concern   Not on file  Social History Narrative   Not on file   Social Determinants of Health   Financial Resource Strain: Not on file  Food Insecurity: Not on file  Transportation Needs: Not on file  Physical Activity: Not on file  Stress: Not on file  Social Connections: Not on file     Family History: The patient's family history includes Breast cancer in her sister; Cancer in her son; Chronic Renal Failure in her father; Diabetes in her brother and father; Heart attack in  her mother; Lung cancer in her father and sister. There is no history of Colon cancer, Esophageal cancer, Inflammatory bowel disease, Liver disease, Pancreatic cancer, Stomach cancer, Rectal cancer, or Colon polyps.  ROS:   Review of Systems  Constitutional:  Negative for fever and weight loss.  HENT:  Negative for ear discharge and hearing loss.   Eyes:  Negative for double vision and discharge.  Respiratory:  Negative for cough, hemoptysis and sputum production.   Cardiovascular:  Negative for chest pain, palpitations, orthopnea, claudication, leg swelling and PND.  Gastrointestinal:  Negative for constipation and diarrhea.  Genitourinary:  Negative for flank pain and urgency.  Musculoskeletal:  Positive for myalgias (Bilateral LE). Negative for neck pain.  Neurological:  Negative for speech change,  loss of consciousness and headaches.  Endo/Heme/Allergies:  Negative for polydipsia.  Psychiatric/Behavioral:  Negative for suicidal ideas. The patient does not have insomnia.     EKGs/Labs/Other Studies Reviewed:    The following studies were reviewed today:  CT Chest  02/15/2022: FINDINGS: Cardiovascular: Normal heart size. No pericardial effusion. No significant coronary artery calcifications. Mild atherosclerotic disease of the thoracic aorta.   Mediastinum/Nodes: Small hiatal hernia. No pathologically enlarged lymph nodes seen in the chest.   Lungs/Pleura: Central airways are patent. Mild centrilobular emphysema. No consolidation, pleural effusion or pneumothorax. New tiny solid pulmonary nodule of the left upper lobe measuring 3 mm on image 53. Other previously seen solid pulmonary nodules are stable.   Upper Abdomen: Calcifications of the left adrenal gland, likely due to prior hemorrhage. Atrophic right kidney. No acute abnormality.   Musculoskeletal: No chest wall mass or suspicious bone lesions identified.  IMPRESSION: 1. Lung-RADS 2, benign appearance or behavior.  Continue annual screening with low-dose chest CT without contrast in 12 months. 2. Aortic Atherosclerosis (ICD10-I70.0) and Emphysema (ICD10-J43.9).  Right Renal Artery Doppler  03/29/2021: Summary:  Renal:    Right: Abnormal cortical thickness of right kidney. Abnormal right         Resistive Index. 1-59% stenosis of the distal right renal         artery.   Renal Angiography with PVI 04/30/2019: Pre-operative Diagnosis: High-grade right renal artery stenosis Post-operative diagnosis:  Same Surgeon:  Marty Heck, MD Procedure Performed: 1.  Ultrasound-guided access of the right common femoral artery 2.  Aortogram with renal artery arteriogram 3.  Right renal artery angioplasty (4 mm x 15 mm Viatrac) 4.  Right renal artery stent placement (5 mm x 12 mm Herculink) 5.  Mynx closure of the right common femoral artery after limited right femoral arteriogram 6.  43 minutes of monitored moderate conscious sedation time   Indications: Patient is a 71 year old female who was recently seen in clinic by Dr. Donnetta Hutching for evaluation of high-grade right renal artery stenosis.  She had both duplex imaging as well as MRI that suggested high-grade right renal artery stenosis in setting of rising blood pressure as well as renal insufficiency with optimal medical management.  She presents after risks and benefits were discussed..   Findings:  Limited aortogram showed high-grade greater than 99% right renal artery stenosis at the ostium.  The left renal artery is widely patent.  After the right renal artery was cannulated with a 014 wire could not get the catheter to track into the renal artery.  I subsequently predilated the right renal artery lesion with a 4 mm Viatrac.  I then deployed a 5 mm Herculink stent.  There is no evidence of residual stenosis in the right renal artery and it is widely patent including the stent with no dissection or flow-limiting defect.  The right kidney is smaller than the  left suggesting some atrophy.  MRA Abdomen  04/05/2019: IMPRESSION: Findings consistent with significant proximal stenosis involving the origin and immediate proximal segment of a single right renal artery. There is associated right renal atrophy as depicted by prior ultrasound. There are 3 separate left renal arteries that appear to be normally patent.  Renal Artery Doppler  02/13/2019: IMPRESSION: 1. Potential hemodynamically significant stenosis involving the origin of the right renal artery with mild asymmetric atrophy of the right kidney comparison to the left, findings which could be seen in the setting of renal artery stenosis. Further evaluation with CTA of the  abdomen and pelvis could be performed as indicated. 2. No evidence of left-sided renal artery stenosis or renal atrophy.  Coronary CTA: 09/2018 1. Coronary calcium score of 46. This was 28 percentile for age and sex matched control. 2. Normal coronary origin with right dominance. 3. Minimal diffuse CAD.  Risk factor modification is recommended.  TTE: 08/2018   Left ventricle: The cavity size was normal. There was mild   concentric hypertrophy. Systolic function was normal. The   estimated ejection fraction was in the range of 60% to 65%. Wall   motion was normal; there were no regional wall motion   abnormalities. Doppler parameters are consistent with abnormal   left ventricular relaxation (grade 1 diastolic dysfunction).   Doppler parameters are consistent with elevated ventricular   end-diastolic filling pressure. - Aortic valve: There was mild regurgitation. - Right ventricle: The cavity size was normal. Wall thickness was   normal. Systolic function was normal. - Right atrium: The atrium was normal in size. - Tricuspid valve: There was trivial regurgitation. - Pulmonary arteries: Systolic pressure was within the normal   range. - Inferior vena cava: The vessel was normal in size. - Pericardium, extracardiac:  There was no pericardial effusion.    EKG:  EKG is personally reviewed. 07/06/2022:  EKG was not ordered. 03/08/2021: NSR, HR 79 bpm  Recent Labs: 04/21/2022: ALT 20   Recent Lipid Panel    Component Value Date/Time   CHOL 113 04/21/2022 1047   TRIG 119 04/21/2022 1047   HDL 45 04/21/2022 1047   CHOLHDL 2.5 04/21/2022 1047   LDLCALC 47 04/21/2022 1047     Risk Assessment/Calculations:           Physical Exam:    VS:  BP 128/82   Pulse 92   Ht '5\' 6"'$  (1.676 m)   Wt 183 lb (83 kg)   SpO2 98%   BMI 29.54 kg/m     Wt Readings from Last 3 Encounters:  07/06/22 183 lb (83 kg)  05/05/22 185 lb 6 oz (84.1 kg)  09/28/21 195 lb (88.5 kg)     GEN: Well nourished, well developed in no acute distress HEENT: Normal NECK: No JVD; No carotid bruits CARDIAC: RRR, 2/6 soft systolic murmur. No rubs, no gallops RESPIRATORY:  Clear to auscultation without rales, wheezing or rhonchi  ABDOMEN: Soft, non-tender, non-distended MUSCULOSKELETAL:  No edema; No deformity  SKIN: Warm and dry NEUROLOGIC:  Alert and oriented x 3 PSYCHIATRIC:  Normal affect   ASSESSMENT:    1. Chronic diastolic CHF (congestive heart failure) ([place])   2. Aortic valve insufficiency, etiology of cardiac valve disease unspecified   3. Coronary artery disease involving native coronary artery of native heart without angina pectoris   4. Mixed hyperlipidemia   5. Renal artery stenosis ([place])   6. Essential hypertension   7. Stage 3a chronic kidney disease (HCC)    PLAN:    In order of problems listed above:  #Chronic Diastolic Heart Failure: TTE in 2020 with normal LVEF, G1DD. Currently appears compensated and euvolemic on examination.  -Continue lasix '40mg'$  daily as needed -Continue spiro '25mg'$  daily  -Low Na diet  #Mild Non-Obstructive CAD: No anginal symptoms. -Continue ASA '81mg'$  daily -Continue lipitor '40mg'$  daily  #HLD: -Continue lipitor '40mg'$  daily -LDL controlled at 47 in 03/2022  #Mild  AR: Noted on TTE 08/2018 with mild AR. Will continue serial monitoring. -Check TTE for monitoring  #Renal Artery Stenosis s/p stenting: Doing well, blood pressure controlled.  -Continue  lipitor '40mg'$  daily -LDL controlled at 47  #CKD Stage IIIA: Follows with nephrology  #Obesity: -Continue lifestyle modifications as below  Exercise recommendations: Goal of exercising for at least 30 minutes a day, at least 5 times per week.  Please exercise to a moderate exertion.  This means that while exercising it is difficult to speak in full sentences, however you are not so short of breath that you feel you must stop, and not so comfortable that you can carry on a full conversation.  Exertion level should be approximately a 5/10, if 10 is the most exertion you can perform.  Diet recommendations: Recommend a heart healthy diet such as the Mediterranean diet.  This diet consists of plant based foods, healthy fats, lean meats, olive oil.  It suggests limiting the intake of simple carbohydrates such as white breads, pastries, and pastas.  It also limits the amount of red meat, wine, and dairy products such as cheese that one should consume on a daily basis.     Follow-up in 6 months  Medication Adjustments/Labs and Tests Ordered: Current medicines are reviewed at length with the patient today.  Concerns regarding medicines are outlined above.   Orders Placed This Encounter  Procedures   EKG 12-Lead   ECHOCARDIOGRAM COMPLETE   No orders of the defined types were placed in this encounter.  Patient Instructions  Medication Instructions:   Your physician recommends that you continue on your current medications as directed. Please refer to the Current Medication list given to you today.  *If you need a refill on your cardiac medications before your next appointment, please call your pharmacy*   Testing/Procedures:  Your physician has requested that you have an echocardiogram. Echocardiography  is a painless test that uses sound waves to create images of your heart. It provides your doctor with information about the size and shape of your heart and how well your heart's chambers and valves are working. This procedure takes approximately one hour. There are no restrictions for this procedure. SCHEDULE ECHO TO BE DONE IN JANUARY 2024 PER DR. Johney Frame  Please do NOT wear cologne, perfume, aftershave, or lotions (deodorant is allowed). Please arrive 15 minutes prior to your appointment time.    Follow-Up: At [facility], you and your health needs are our priority.  As part of our continuing mission to provide you with exceptional heart care, we have created designated Provider Care Teams.  These Care Teams include your primary Cardiologist (physician) and Advanced Practice Providers (APPs -  Physician Assistants and Nurse Practitioners) who all work together to provide you with the care you need, when you need it.  We recommend signing up for the patient portal called "MyChart".  Sign up information is provided on this After Visit Summary.  MyChart is used to connect with patients for Virtual Visits (Telemedicine).  Patients are able to view lab/test results, encounter notes, upcoming appointments, etc.  Non-urgent messages can be sent to your provider as well.   To learn more about what you can do with MyChart, go to [email or web address].    Your next appointment:   6 month(s)  The format for your next appointment:   In Person  Provider:   DR. Johney Frame   Important Information About Sugar       I,Mitra Faeizi,acting as a scribe for Freada Bergeron, MD.,have documented all relevant documentation on the behalf of Freada Bergeron, MD,as directed by  Freada Bergeron, MD while in the presence of [facility]  Renae Fickle, MD.   I, Freada Bergeron, MD, have reviewed all documentation for this visit. The documentation on 07/06/22 for the exam, diagnosis,  procedures, and orders are all accurate and complete.   Signed, Freada Bergeron, MD  07/06/2022 8:23 PM    [place]

## 2022-07-06 NOTE — Patient Instructions (Signed)
Medication Instructions:   Your physician recommends that you continue on your current medications as directed. Please refer to the Current Medication list given to you today.  *If you need a refill on your cardiac medications before your next appointment, please call your pharmacy*   Testing/Procedures:  Your physician has requested that you have an echocardiogram. Echocardiography is a painless test that uses sound waves to create images of your heart. It provides your doctor with information about the size and shape of your heart and how well your heart's chambers and valves are working. This procedure takes approximately one hour. There are no restrictions for this procedure. SCHEDULE ECHO TO BE DONE IN JANUARY 2024 PER DR. Johney Frame  Please do NOT wear cologne, perfume, aftershave, or lotions (deodorant is allowed). Please arrive 15 minutes prior to your appointment time.    Follow-Up: At [facility], you and your health needs are our priority.  As part of our continuing mission to provide you with exceptional heart care, we have created designated Provider Care Teams.  These Care Teams include your primary Cardiologist (physician) and Advanced Practice Providers (APPs -  Physician Assistants and Nurse Practitioners) who all work together to provide you with the care you need, when you need it.  We recommend signing up for the patient portal called "MyChart".  Sign up information is provided on this After Visit Summary.  MyChart is used to connect with patients for Virtual Visits (Telemedicine).  Patients are able to view lab/test results, encounter notes, upcoming appointments, etc.  Non-urgent messages can be sent to your provider as well.   To learn more about what you can do with MyChart, go to [email or web address].    Your next appointment:   6 month(s)  The format for your next appointment:   In Person  Provider:   DR. Johney Frame   Important Information About  Sugar

## 2022-07-12 ENCOUNTER — Other Ambulatory Visit: Payer: Self-pay

## 2022-07-12 DIAGNOSIS — I2510 Atherosclerotic heart disease of native coronary artery without angina pectoris: Secondary | ICD-10-CM

## 2022-07-12 DIAGNOSIS — I10 Essential (primary) hypertension: Secondary | ICD-10-CM

## 2022-07-12 MED ORDER — SPIRONOLACTONE 25 MG PO TABS: ORAL_TABLET | ORAL | 3 refills | Status: DC

## 2022-07-12 MED ORDER — FUROSEMIDE 40 MG PO TABS: 40.0000 mg | ORAL_TABLET | ORAL | 11 refills | Status: DC | PRN

## 2022-07-12 MED ORDER — EZETIMIBE 10 MG PO TABS: 10.0000 mg | ORAL_TABLET | Freq: Every day | ORAL | 3 refills | Status: DC

## 2022-07-13 ENCOUNTER — Other Ambulatory Visit: Payer: Self-pay | Admitting: *Deleted

## 2022-07-13 DIAGNOSIS — I701 Atherosclerosis of renal artery: Secondary | ICD-10-CM

## 2022-07-26 ENCOUNTER — Other Ambulatory Visit (HOSPITAL_BASED_OUTPATIENT_CLINIC_OR_DEPARTMENT_OTHER): Payer: Self-pay

## 2022-07-26 MED ORDER — OZEMPIC (2 MG/DOSE) 8 MG/3ML ~~LOC~~ SOPN
2.0000 mg | PEN_INJECTOR | SUBCUTANEOUS | 3 refills | Status: DC
  Filled 2022-07-26: qty 3, 28d supply, fill #0

## 2022-08-01 ENCOUNTER — Encounter: Payer: Self-pay | Admitting: Vascular Surgery

## 2022-08-01 ENCOUNTER — Ambulatory Visit (INDEPENDENT_AMBULATORY_CARE_PROVIDER_SITE_OTHER): Payer: Medicare Other | Admitting: Vascular Surgery

## 2022-08-01 ENCOUNTER — Ambulatory Visit (HOSPITAL_COMMUNITY)
Admission: RE | Admit: 2022-08-01 | Discharge: 2022-08-01 | Disposition: A | Discharge status: Home / Self Care | Payer: Medicare Other | Source: Ambulatory Visit | Attending: Vascular Surgery | Admitting: Vascular Surgery

## 2022-08-01 VITALS — BP 103/66 | HR 74 | Temp 97.3°F | Resp 14 | Ht 66.0 in | Wt 177.0 lb

## 2022-08-01 DIAGNOSIS — I701 Atherosclerosis of renal artery: Secondary | ICD-10-CM

## 2022-08-01 NOTE — Progress Notes (Signed)
Patient name: Katherine Greer MRN: [id number] DOB: 23-Jun-1951 Sex: female  REASON FOR VISIT: 1 year follow-up after right renal artery stent for high-grade renal artery stenosis  HPI: Katherine Greer is a 71 y.o. female presents for 1 year follow-up after right renal artery stent for high-grade stenosis.  Her right renal artery stent was placed on 04/30/2019 via right common femoral artery approach.  She was having HTN with headaches even while on optimal medical management.  Her blood pressures has been very well controlled since stent placement and since her last visit.  She remains on spironolactone and now lasix given chronic diastolic heart failure.  Blood pressure has been well controlled at home.  Remains on aspirin and high dose statin therapy.   Past Medical History:  Diagnosis Date   Abdominal bloating 05/03/2018   Allergy    Arthritis    Asthma    Cataract    CHF (congestive heart failure) (HCC)    Chronic kidney disease    25% function in the right kidney   Colon polyp    GERD (gastroesophageal reflux disease)    Heart murmur    Hypertension    Osteopenia    Pill dysphagia 05/03/2018   SOB (shortness of breath) 05/03/2018   Thyroid disease    UTI (urinary tract infection)     Past Surgical History:  Procedure Laterality Date   COLONOSCOPY WITH ESOPHAGOGASTRODUODENOSCOPY (EGD)  09/28/2021   Mansouraty   FOOT SURGERY Bilateral    PERIPHERAL VASCULAR INTERVENTION Right 04/30/2019   Procedure: PERIPHERAL VASCULAR INTERVENTION;  Surgeon: Marty Heck, MD;  Location: [place];  Service: Cardiovascular;  Laterality: Right;  Renal artery   RENAL ANGIOGRAPHY Right 04/30/2019   Procedure: RENAL ANGIOGRAPHY;  Surgeon: Marty Heck, MD;  Location: [facility];  Service: Cardiovascular;  Laterality: Right;   ROTATOR CUFF REPAIR Right     Family History  Problem Relation Age of Onset   Heart attack Mother    Diabetes Father    Chronic Renal Failure  Father    Lung cancer Father    Breast cancer Sister    Lung cancer Sister    Diabetes Brother    Cancer Son        Pituitary- Metastatic Brain   Colon cancer Neg Hx    Esophageal cancer Neg Hx    Inflammatory bowel disease Neg Hx    Liver disease Neg Hx    Pancreatic cancer Neg Hx    Stomach cancer Neg Hx    Rectal cancer Neg Hx    Colon polyps Neg Hx     SOCIAL HISTORY: Social History   Tobacco Use   Smoking status: Former    Packs/day: 1.00    Years: 40.00    Total pack years: 40.00    Types: Cigarettes    Quit date: 2012    Years since quitting: 11.9   Smokeless tobacco: Never   Tobacco comments:    Quit 2015, uses nicotine lozenges  Substance Use Topics   Alcohol use: Yes    Comment: wine 4 times a week    Allergies  Allergen Reactions   Augmentin [Amoxicillin-Pot Clavulanate] Anaphylaxis and Rash    Did it involve swelling of the face/tongue/throat, SOB, or low BP? Yes Did it involve sudden or severe rash/hives, skin peeling, or any reaction on the inside of your mouth or nose? Yes Did you need to seek medical attention at a hospital or doctor's office? No When  did it last happen?      18 years If all above answers are "NO", may proceed with cephalosporin use.    Keflex [Cephalexin] Anaphylaxis   Levaquin [Levofloxacin In D5w] Anaphylaxis   Sulfa Antibiotics Anaphylaxis and Rash   Carvedilol Other (See Comments)    Pt reports carvedilol causes her hair loss.    Cephalosporins     Other reaction(s): Other (See Comments) NO REACTION STATED.   Contrast Media [Iodinated Contrast Media]     Lowered kidney function    Depakote Er [Divalproex Sodium Er] Nausea Only   Latex Itching   Nickel Hives    Topical dermatitis sees Dr. Wilhemina Bonito    Nsaids     Avoid due to kidney function    Tobramycin Swelling    redness   Topamax [Topiramate]     unknown   Tramadol     Per pt this causes flushing and facial swelling    Current Outpatient Medications   Medication Sig Dispense Refill   acetaminophen (TYLENOL) 500 MG tablet Take 1,000 mg by mouth every 6 (six) hours as needed for mild pain or moderate pain.      aspirin EC 81 MG tablet Take 81 mg by mouth daily. Swallow whole.     atorvastatin (LIPITOR) 80 MG tablet Take 1 tablet (80 mg total) by mouth daily. 90 tablet 3   Cholecalciferol 2000 units CAPS Take 2,000 Units by mouth at bedtime.      ezetimibe (ZETIA) 10 MG tablet Take 1 tablet (10 mg total) by mouth daily. 90 tablet 3   famotidine (PEPCID) 20 MG tablet TAKE 1 TABLET BY MOUTH TWICE DAILY 90 tablet 3   fluticasone (FLONASE) 50 MCG/ACT nasal spray Place 1 spray into both nostrils daily as needed for allergies or rhinitis.     furosemide (LASIX) 40 MG tablet Take 1 tablet (40 mg total) by mouth as needed. 30 tablet 11   gabapentin (NEURONTIN) 100 MG capsule Take 100 mg by mouth 2 (two) times daily.     halobetasol (ULTRAVATE) 0.05 % cream Apply 1 application topically 2 (two) times daily as needed (dermatitis).      ibandronate (BONIVA) 150 MG tablet Take 150 mg by mouth every 30 (thirty) days.     ketoconazole (NIZORAL) 2 % shampoo Apply topically 2 (two) times a week.     levothyroxine (SYNTHROID, LEVOTHROID) 112 MCG tablet Take 112 mcg by mouth daily before breakfast.     nicotine polacrilex (COMMIT) 4 MG lozenge Take 4 mg by mouth as needed for smoking cessation.     omeprazole (PRILOSEC) 40 MG capsule TAKE 1 CAPSULE BY MOUTH EVERY DAY 90 capsule 3   OZEMPIC, 2 MG/DOSE, 8 MG/3ML SOPN Inject 2 mg into the skin once a week.     Semaglutide, 2 MG/DOSE, (OZEMPIC, 2 MG/DOSE,) 8 MG/3ML SOPN Inject 2 mg into the skin once a week. 3 mL 3   spironolactone (ALDACTONE) 25 MG tablet TAKE 1 TABLET(25 MG) BY MOUTH TWICE DAILY 180 tablet 3   tolterodine (DETROL LA) 4 MG 24 hr capsule Take 4 mg by mouth daily.     triamcinolone cream (KENALOG) 0.1 % Apply 1 application topically 2 (two) times daily as needed (dermatitis).     Current  Facility-Administered Medications  Medication Dose Route Frequency Provider Last Rate Last Admin   0.9 %  sodium chloride infusion  500 mL Intravenous Once Mansouraty, Telford Nab., MD        REVIEW OF SYSTEMS:  [  X] denotes positive finding, '[ ]'$  denotes negative finding Cardiac  Comments:  Chest pain or chest pressure:    Shortness of breath upon exertion:    Short of breath when lying flat:    Irregular heart rhythm:        Vascular    Pain in calf, thigh, or hip brought on by ambulation:    Pain in feet at night that wakes you up from your sleep:     Blood clot in your veins:    Leg swelling:         Pulmonary    Oxygen at home:    Productive cough:     Wheezing:         Neurologic    Sudden weakness in arms or legs:     Sudden numbness in arms or legs:     Sudden onset of difficulty speaking or slurred speech:    Temporary loss of vision in one eye:     Problems with dizziness:         Gastrointestinal    Blood in stool:     Vomited blood:         Genitourinary    Burning when urinating:     Blood in urine:        Psychiatric    Major depression:         Hematologic    Bleeding problems:    Problems with blood clotting too easily:        Skin    Rashes or ulcers:        Constitutional    Fever or chills:      PHYSICAL EXAM: Vitals:   08/01/22 0921  BP: 103/66  Pulse: 74  Resp: 14  Temp: (!) 97.3 F (36.3 C)  TempSrc: Temporal  SpO2: 97%  Weight: 177 lb (80.3 kg)  Height: '5\' 6"'$  (1.676 m)    GENERAL: The patient is a well-nourished female, in no acute distress. The vital signs are documented above. Abdomen: soft, ND, NT Vascular: Palpable femoral pulses bilaterally.    DATA:   No evidence of recurrent stenosis in the right renal artery after previous stenting.  Assessment/Plan:  71 year old female who presents for 1 year interval follow-up after right renal artery angioplasty with stent placement on 04/30/19 for high-grade right renal artery  stenosis.  Has no evidence of recurrent high-grade stenosis in the right renal artery.  She also has minimal disease in the left renal artery.  Blood pressures have been well controlled.  Discussed she remain on aspirin statin for risk reduction and help maintain patency of the stent.  Will see her again in 1 year with bilateral renal artery duplex.   Marty Heck, MD [facility] Office: [phone]

## 2022-08-03 DIAGNOSIS — M542 Cervicalgia: Secondary | ICD-10-CM | POA: Diagnosis not present

## 2022-08-08 DIAGNOSIS — M542 Cervicalgia: Secondary | ICD-10-CM | POA: Diagnosis not present

## 2022-08-10 DIAGNOSIS — M542 Cervicalgia: Secondary | ICD-10-CM | POA: Diagnosis not present

## 2022-08-14 DIAGNOSIS — M542 Cervicalgia: Secondary | ICD-10-CM | POA: Diagnosis not present

## 2022-08-16 ENCOUNTER — Other Ambulatory Visit (HOSPITAL_BASED_OUTPATIENT_CLINIC_OR_DEPARTMENT_OTHER): Payer: Self-pay

## 2022-08-16 ENCOUNTER — Other Ambulatory Visit: Payer: Self-pay

## 2022-08-16 DIAGNOSIS — M542 Cervicalgia: Secondary | ICD-10-CM | POA: Diagnosis not present

## 2022-08-16 MED ORDER — OZEMPIC (2 MG/DOSE) 8 MG/3ML ~~LOC~~ SOPN
2.0000 mg | PEN_INJECTOR | SUBCUTANEOUS | 3 refills | Status: DC
  Filled 2022-08-16: qty 3, 28d supply, fill #0
  Filled 2022-09-20 – 2022-10-11 (×3): qty 3, 28d supply, fill #1

## 2022-08-17 ENCOUNTER — Other Ambulatory Visit (HOSPITAL_BASED_OUTPATIENT_CLINIC_OR_DEPARTMENT_OTHER): Payer: Self-pay

## 2022-08-29 ENCOUNTER — Other Ambulatory Visit: Payer: Self-pay | Admitting: Internal Medicine

## 2022-08-29 DIAGNOSIS — I701 Atherosclerosis of renal artery: Secondary | ICD-10-CM | POA: Diagnosis not present

## 2022-08-29 DIAGNOSIS — N3281 Overactive bladder: Secondary | ICD-10-CM | POA: Diagnosis not present

## 2022-08-29 DIAGNOSIS — F17209 Nicotine dependence, unspecified, with unspecified nicotine-induced disorders: Secondary | ICD-10-CM | POA: Diagnosis not present

## 2022-08-29 DIAGNOSIS — I519 Heart disease, unspecified: Secondary | ICD-10-CM | POA: Diagnosis not present

## 2022-08-29 DIAGNOSIS — I129 Hypertensive chronic kidney disease with stage 1 through stage 4 chronic kidney disease, or unspecified chronic kidney disease: Secondary | ICD-10-CM | POA: Diagnosis not present

## 2022-08-29 DIAGNOSIS — R911 Solitary pulmonary nodule: Secondary | ICD-10-CM

## 2022-08-29 DIAGNOSIS — M810 Age-related osteoporosis without current pathological fracture: Secondary | ICD-10-CM | POA: Diagnosis not present

## 2022-08-29 DIAGNOSIS — I2510 Atherosclerotic heart disease of native coronary artery without angina pectoris: Secondary | ICD-10-CM | POA: Diagnosis not present

## 2022-08-29 DIAGNOSIS — E785 Hyperlipidemia, unspecified: Secondary | ICD-10-CM | POA: Diagnosis not present

## 2022-08-29 DIAGNOSIS — R7301 Impaired fasting glucose: Secondary | ICD-10-CM | POA: Diagnosis not present

## 2022-08-29 DIAGNOSIS — N1831 Chronic kidney disease, stage 3a: Secondary | ICD-10-CM | POA: Diagnosis not present

## 2022-08-29 DIAGNOSIS — E039 Hypothyroidism, unspecified: Secondary | ICD-10-CM | POA: Diagnosis not present

## 2022-08-30 DIAGNOSIS — M542 Cervicalgia: Secondary | ICD-10-CM | POA: Diagnosis not present

## 2022-09-04 DIAGNOSIS — M542 Cervicalgia: Secondary | ICD-10-CM | POA: Diagnosis not present

## 2022-09-06 ENCOUNTER — Ambulatory Visit (HOSPITAL_COMMUNITY): Payer: Medicare Other | Attending: Cardiology

## 2022-09-06 ENCOUNTER — Telehealth: Payer: Self-pay | Admitting: *Deleted

## 2022-09-06 DIAGNOSIS — I351 Nonrheumatic aortic (valve) insufficiency: Secondary | ICD-10-CM | POA: Insufficient documentation

## 2022-09-06 LAB — ECHOCARDIOGRAM COMPLETE
Area-P 1/2: 3.53 cm2
P 1/2 time: 380 msec
S' Lateral: 2.7 cm

## 2022-09-06 NOTE — Telephone Encounter (Signed)
-----   Message from Freada Bergeron, MD sent at 09/06/2022  2:47 PM EST ----- Her echo shows normal pumping function. Her aortic valve leaks slightly more than prior at mild to moderate but this will not cause symptoms and something we will continue to monitor going forward with a repeat echo in 2 years. They also commented that she has some calcification of her valves which is common as we get older. There is no significant narrowing or leakiness of her other valves.

## 2022-09-06 NOTE — Telephone Encounter (Signed)
The patient has been notified of the result and verbalized understanding.  All questions (if any) were answered.  Pt aware she will need a repeat echo in 2 years for surveillance.   Pt aware that I will place the order for repeat echo in 2 years in the system and send a message to our echo scheduler to call her back closer to that time to arrange this appt.  Pt verbalized understanding and agrees with this plan.

## 2022-09-07 DIAGNOSIS — M542 Cervicalgia: Secondary | ICD-10-CM | POA: Diagnosis not present

## 2022-09-13 DIAGNOSIS — M542 Cervicalgia: Secondary | ICD-10-CM | POA: Diagnosis not present

## 2022-09-14 DIAGNOSIS — Z01419 Encounter for gynecological examination (general) (routine) without abnormal findings: Secondary | ICD-10-CM | POA: Diagnosis not present

## 2022-09-14 DIAGNOSIS — Z6828 Body mass index (BMI) 28.0-28.9, adult: Secondary | ICD-10-CM | POA: Diagnosis not present

## 2022-09-18 DIAGNOSIS — M542 Cervicalgia: Secondary | ICD-10-CM | POA: Diagnosis not present

## 2022-09-20 ENCOUNTER — Other Ambulatory Visit (HOSPITAL_BASED_OUTPATIENT_CLINIC_OR_DEPARTMENT_OTHER): Payer: Self-pay

## 2022-09-20 DIAGNOSIS — M542 Cervicalgia: Secondary | ICD-10-CM | POA: Diagnosis not present

## 2022-09-25 DIAGNOSIS — M542 Cervicalgia: Secondary | ICD-10-CM | POA: Diagnosis not present

## 2022-09-27 ENCOUNTER — Other Ambulatory Visit (HOSPITAL_BASED_OUTPATIENT_CLINIC_OR_DEPARTMENT_OTHER): Payer: Self-pay

## 2022-09-27 DIAGNOSIS — M542 Cervicalgia: Secondary | ICD-10-CM | POA: Diagnosis not present

## 2022-09-30 ENCOUNTER — Other Ambulatory Visit (HOSPITAL_BASED_OUTPATIENT_CLINIC_OR_DEPARTMENT_OTHER): Payer: Self-pay

## 2022-10-02 DIAGNOSIS — M542 Cervicalgia: Secondary | ICD-10-CM | POA: Diagnosis not present

## 2022-10-04 DIAGNOSIS — M542 Cervicalgia: Secondary | ICD-10-CM | POA: Diagnosis not present

## 2022-10-05 ENCOUNTER — Other Ambulatory Visit: Payer: Self-pay | Admitting: Gastroenterology

## 2022-10-09 DIAGNOSIS — M542 Cervicalgia: Secondary | ICD-10-CM | POA: Diagnosis not present

## 2022-10-11 ENCOUNTER — Other Ambulatory Visit (HOSPITAL_BASED_OUTPATIENT_CLINIC_OR_DEPARTMENT_OTHER): Payer: Self-pay

## 2022-10-16 DIAGNOSIS — J40 Bronchitis, not specified as acute or chronic: Secondary | ICD-10-CM | POA: Diagnosis not present

## 2022-10-16 DIAGNOSIS — R0981 Nasal congestion: Secondary | ICD-10-CM | POA: Diagnosis not present

## 2022-10-16 DIAGNOSIS — Z1152 Encounter for screening for COVID-19: Secondary | ICD-10-CM | POA: Diagnosis not present

## 2022-10-16 DIAGNOSIS — J441 Chronic obstructive pulmonary disease with (acute) exacerbation: Secondary | ICD-10-CM | POA: Diagnosis not present

## 2022-10-16 DIAGNOSIS — I129 Hypertensive chronic kidney disease with stage 1 through stage 4 chronic kidney disease, or unspecified chronic kidney disease: Secondary | ICD-10-CM | POA: Diagnosis not present

## 2022-10-16 DIAGNOSIS — R911 Solitary pulmonary nodule: Secondary | ICD-10-CM | POA: Diagnosis not present

## 2022-10-16 DIAGNOSIS — N1831 Chronic kidney disease, stage 3a: Secondary | ICD-10-CM | POA: Diagnosis not present

## 2022-10-16 DIAGNOSIS — J439 Emphysema, unspecified: Secondary | ICD-10-CM | POA: Diagnosis not present

## 2022-10-16 DIAGNOSIS — R051 Acute cough: Secondary | ICD-10-CM | POA: Diagnosis not present

## 2022-10-16 DIAGNOSIS — J069 Acute upper respiratory infection, unspecified: Secondary | ICD-10-CM | POA: Diagnosis not present

## 2022-10-16 DIAGNOSIS — J029 Acute pharyngitis, unspecified: Secondary | ICD-10-CM | POA: Diagnosis not present

## 2022-10-19 ENCOUNTER — Other Ambulatory Visit (HOSPITAL_BASED_OUTPATIENT_CLINIC_OR_DEPARTMENT_OTHER): Payer: Self-pay

## 2022-10-23 ENCOUNTER — Other Ambulatory Visit (HOSPITAL_BASED_OUTPATIENT_CLINIC_OR_DEPARTMENT_OTHER): Payer: Self-pay

## 2022-10-29 ENCOUNTER — Other Ambulatory Visit (HOSPITAL_BASED_OUTPATIENT_CLINIC_OR_DEPARTMENT_OTHER): Payer: Self-pay

## 2022-11-24 ENCOUNTER — Other Ambulatory Visit (HOSPITAL_BASED_OUTPATIENT_CLINIC_OR_DEPARTMENT_OTHER): Payer: Self-pay

## 2022-12-11 ENCOUNTER — Other Ambulatory Visit: Payer: Self-pay | Admitting: Gastroenterology

## 2022-12-31 NOTE — Progress Notes (Deleted)
Cardiology Office Note:    Date:  12/31/2022   ID:  Katherine Greer, DOB Feb 09, 1951, MRN [id number]  PCP:  Rodrigo Ran, MD   [facility] Providers Cardiologist:  Tobias Alexander, MD {  Referring MD: Rodrigo Ran, MD    History of Present Illness:    Katherine Greer is a 72 y.o. 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 Delton See who now presentes to clinic for follow-up.  Last seen in clinic on 06/2022 where she was doing very well from a CV standpoint.  Today ***  Past Medical History:  Diagnosis Date   Abdominal bloating 05/03/2018   Allergy    Arthritis    Asthma    Cataract    CHF (congestive heart failure) (HCC)    Chronic kidney disease    25% function in the right kidney   Colon polyp    GERD (gastroesophageal reflux disease)    Heart murmur    Hypertension    Osteopenia    Pill dysphagia 05/03/2018   SOB (shortness of breath) 05/03/2018   Thyroid disease    UTI (urinary tract infection)     Past Surgical History:  Procedure Laterality Date   COLONOSCOPY WITH ESOPHAGOGASTRODUODENOSCOPY (EGD)  09/28/2021   Mansouraty   FOOT SURGERY Bilateral    PERIPHERAL VASCULAR INTERVENTION Right 04/30/2019   Procedure: PERIPHERAL VASCULAR INTERVENTION;  Surgeon: Cephus Shelling, MD;  Location: [facility];  Service: Cardiovascular;  Laterality: Right;  Renal artery   RENAL ANGIOGRAPHY Right 04/30/2019   Procedure: RENAL ANGIOGRAPHY;  Surgeon: Cephus Shelling, MD;  Location: [facility];  Service: Cardiovascular;  Laterality: Right;   ROTATOR CUFF REPAIR Right     Current Medications: No outpatient medications have been marked as taking for the 01/08/23 encounter (Appointment) with Meriam Sprague, MD.   Current  Facility-Administered Medications for the 01/08/23 encounter (Appointment) with Meriam Sprague, MD  Medication   0.9 %  sodium chloride infusion     Allergies:   Augmentin [amoxicillin-pot clavulanate], Keflex [cephalexin], Levaquin [levofloxacin in d5w], Sulfa antibiotics, Carvedilol, Cephalosporins, Contrast media [iodinated contrast media], Depakote er [divalproex sodium er], Latex, Nickel, Nsaids, Tobramycin, Topamax [topiramate], and Tramadol   Social History   Socioeconomic History   Marital status: Married    Spouse name: Not on file   Number of children: 2   Years of education: Not on file   Highest education level: Not on file  Occupational History   Occupation: nterior Estate agent     Comment: retired  Tobacco Use   Smoking status: Former    Packs/day: 1.00    Years: 40.00    Additional pack years: 0.00    Total pack years: 40.00    Types: Cigarettes    Quit date: 2012    Years since quitting: 12.3   Smokeless tobacco: Never   Tobacco comments:    Quit 2015, uses nicotine lozenges  Vaping Use   Vaping Use: Never used  Substance and Sexual Activity   Alcohol use: Yes    Comment: wine 4 times a week   Drug use: Never   Sexual activity: Yes  Other Topics Concern   Not on file  Social History Narrative   Not on file   Social Determinants of Corporate investment banker  Strain: Not on file  Food Insecurity: Not on file  Transportation Needs: Not on file  Physical Activity: Not on file  Stress: Not on file  Social Connections: Not on file     Family History: The patient's family history includes Breast cancer in her sister; Cancer in her son; Chronic Renal Failure in her father; Diabetes in her brother and father; Heart attack in her mother; Lung cancer in her father and sister. There is no history of Colon cancer, Esophageal cancer, Inflammatory bowel disease, Liver disease, Pancreatic cancer, Stomach cancer, Rectal cancer, or Colon polyps.  ROS:   Review  of Systems  Constitutional:  Negative for fever and weight loss.  HENT:  Negative for ear discharge and hearing loss.   Eyes:  Negative for double vision and discharge.  Respiratory:  Negative for cough, hemoptysis and sputum production.   Cardiovascular:  Negative for chest pain, palpitations, orthopnea, claudication, leg swelling and PND.  Gastrointestinal:  Negative for constipation and diarrhea.  Genitourinary:  Negative for flank pain and urgency.  Musculoskeletal:  Positive for myalgias (Bilateral LE). Negative for neck pain.  Neurological:  Negative for speech change, loss of consciousness and headaches.  Endo/Heme/Allergies:  Negative for polydipsia.  Psychiatric/Behavioral:  Negative for suicidal ideas. The patient does not have insomnia.     EKGs/Labs/Other Studies Reviewed:    The following studies were reviewed today:  CT Chest  02/15/2022: FINDINGS: Cardiovascular: Normal heart size. No pericardial effusion. No significant coronary artery calcifications. Mild atherosclerotic disease of the thoracic aorta.   Mediastinum/Nodes: Small hiatal hernia. No pathologically enlarged lymph nodes seen in the chest.   Lungs/Pleura: Central airways are patent. Mild centrilobular emphysema. No consolidation, pleural effusion or pneumothorax. New tiny solid pulmonary nodule of the left upper lobe measuring 3 mm on image 53. Other previously seen solid pulmonary nodules are stable.   Upper Abdomen: Calcifications of the left adrenal gland, likely due to prior hemorrhage. Atrophic right kidney. No acute abnormality.   Musculoskeletal: No chest wall mass or suspicious bone lesions identified.  IMPRESSION: 1. Lung-RADS 2, benign appearance or behavior. Continue annual screening with low-dose chest CT without contrast in 12 months. 2. Aortic Atherosclerosis (ICD10-I70.0) and Emphysema (ICD10-J43.9).  Right Renal Artery Doppler  03/29/2021: Summary:  Renal:    Right: Abnormal  cortical thickness of right kidney. Abnormal right         Resistive Index. 1-59% stenosis of the distal right renal         artery.   Renal Angiography with PVI 04/30/2019: Pre-operative Diagnosis: High-grade right renal artery stenosis Post-operative diagnosis:  Same Surgeon:  Cephus Shelling, MD Procedure Performed: 1.  Ultrasound-guided access of the right common femoral artery 2.  Aortogram with renal artery arteriogram 3.  Right renal artery angioplasty (4 mm x 15 mm Viatrac) 4.  Right renal artery stent placement (5 mm x 12 mm Herculink) 5.  Mynx closure of the right common femoral artery after limited right femoral arteriogram 6.  43 minutes of monitored moderate conscious sedation time   Indications: Patient is a 72 year old female who was recently seen in clinic by Dr. Arbie Cookey for evaluation of high-grade right renal artery stenosis.  She had both duplex imaging as well as MRI that suggested high-grade right renal artery stenosis in setting of rising blood pressure as well as renal insufficiency with optimal medical management.  She presents after risks and benefits were discussed..   Findings:  Limited aortogram showed high-grade greater than 99% right renal  artery stenosis at the ostium.  The left renal artery is widely patent.  After the right renal artery was cannulated with a 014 wire could not get the catheter to track into the renal artery.  I subsequently predilated the right renal artery lesion with a 4 mm Viatrac.  I then deployed a 5 mm Herculink stent.  There is no evidence of residual stenosis in the right renal artery and it is widely patent including the stent with no dissection or flow-limiting defect.  The right kidney is smaller than the left suggesting some atrophy.  MRA Abdomen  04/05/2019: IMPRESSION: Findings consistent with significant proximal stenosis involving the origin and immediate proximal segment of a single right renal artery. There is associated  right renal atrophy as depicted by prior ultrasound. There are 3 separate left renal arteries that appear to be normally patent.  Renal Artery Doppler  02/13/2019: IMPRESSION: 1. Potential hemodynamically significant stenosis involving the origin of the right renal artery with mild asymmetric atrophy of the right kidney comparison to the left, findings which could be seen in the setting of renal artery stenosis. Further evaluation with CTA of the abdomen and pelvis could be performed as indicated. 2. No evidence of left-sided renal artery stenosis or renal atrophy.  Coronary CTA: 09/2018 1. Coronary calcium score of 46. This was 43 percentile for age and sex matched control. 2. Normal coronary origin with right dominance. 3. Minimal diffuse CAD.  Risk factor modification is recommended.  TTE: 08/2018   Left ventricle: The cavity size was normal. There was mild   concentric hypertrophy. Systolic function was normal. The   estimated ejection fraction was in the range of 60% to 65%. Wall   motion was normal; there were no regional wall motion   abnormalities. Doppler parameters are consistent with abnormal   left ventricular relaxation (grade 1 diastolic dysfunction).   Doppler parameters are consistent with elevated ventricular   end-diastolic filling pressure. - Aortic valve: There was mild regurgitation. - Right ventricle: The cavity size was normal. Wall thickness was   normal. Systolic function was normal. - Right atrium: The atrium was normal in size. - Tricuspid valve: There was trivial regurgitation. - Pulmonary arteries: Systolic pressure was within the normal   range. - Inferior vena cava: The vessel was normal in size. - Pericardium, extracardiac: There was no pericardial effusion.    EKG:  EKG is personally reviewed. 07/06/2022:  EKG was not ordered. 03/08/2021: NSR, HR 79 bpm  Recent Labs: 04/21/2022: ALT 20   Recent Lipid Panel    Component Value Date/Time    CHOL 113 04/21/2022 1047   TRIG 119 04/21/2022 1047   HDL 45 04/21/2022 1047   CHOLHDL 2.5 04/21/2022 1047   LDLCALC 47 04/21/2022 1047     Risk Assessment/Calculations:           Physical Exam:    VS:  There were no vitals taken for this visit.    Wt Readings from Last 3 Encounters:  08/01/22 177 lb (80.3 kg)  07/06/22 183 lb (83 kg)  05/05/22 185 lb 6 oz (84.1 kg)     GEN: Well nourished, well developed in no acute distress HEENT: Normal NECK: No JVD; No carotid bruits CARDIAC: RRR, 2/6 soft systolic murmur. No rubs, no gallops RESPIRATORY:  Clear to auscultation without rales, wheezing or rhonchi  ABDOMEN: Soft, non-tender, non-distended MUSCULOSKELETAL:  No edema; No deformity  SKIN: Warm and dry NEUROLOGIC:  Alert and oriented x 3 PSYCHIATRIC:  Normal affect   ASSESSMENT:    No diagnosis found.  PLAN:    In order of problems listed above:  #Chronic Diastolic Heart Failure: TTE in 2020 with normal LVEF, G1DD. Currently appears compensated and euvolemic on examination.  -Continue lasix 40mg  daily as needed -Continue spiro 25mg  daily  -Low Na diet  #Mild Non-Obstructive CAD: No anginal symptoms. -Continue ASA 81mg  daily -Continue lipitor 40mg  daily  #HLD: -Continue lipitor 40mg  daily -LDL controlled at 47 in 03/2022  #Mild AR: Noted on TTE 08/2018 with mild AR. Will continue serial monitoring. -Check TTE for monitoring  #Renal Artery Stenosis s/p stenting: Doing well, blood pressure controlled.  -Continue lipitor 40mg  daily -LDL controlled at 47  #CKD Stage IIIA: Follows with nephrology  #Obesity: -Continue lifestyle modifications as below  Exercise recommendations: Goal of exercising for at least 30 minutes a day, at least 5 times per week.  Please exercise to a moderate exertion.  This means that while exercising it is difficult to speak in full sentences, however you are not so short of breath that you feel you must stop, and not so  comfortable that you can carry on a full conversation.  Exertion level should be approximately a 5/10, if 10 is the most exertion you can perform.  Diet recommendations: Recommend a heart healthy diet such as the Mediterranean diet.  This diet consists of plant based foods, healthy fats, lean meats, olive oil.  It suggests limiting the intake of simple carbohydrates such as white breads, pastries, and pastas.  It also limits the amount of red meat, wine, and dairy products such as cheese that one should consume on a daily basis.     Follow-up in 6 months  Medication Adjustments/Labs and Tests Ordered: Current medicines are reviewed at length with the patient today.  Concerns regarding medicines are outlined above.   No orders of the defined types were placed in this encounter.  No orders of the defined types were placed in this encounter.  There are no Patient Instructions on file for this visit.   Signed, Meriam Sprague, MD  12/31/2022 4:15 PM    [facility]

## 2023-01-08 ENCOUNTER — Ambulatory Visit: Payer: Medicare Other | Admitting: Cardiology

## 2023-01-08 DIAGNOSIS — M25512 Pain in left shoulder: Secondary | ICD-10-CM | POA: Diagnosis not present

## 2023-01-08 DIAGNOSIS — M4692 Unspecified inflammatory spondylopathy, cervical region: Secondary | ICD-10-CM | POA: Diagnosis not present

## 2023-01-12 DIAGNOSIS — H04123 Dry eye syndrome of bilateral lacrimal glands: Secondary | ICD-10-CM | POA: Diagnosis not present

## 2023-01-15 DIAGNOSIS — Z1231 Encounter for screening mammogram for malignant neoplasm of breast: Secondary | ICD-10-CM | POA: Diagnosis not present

## 2023-01-15 DIAGNOSIS — N644 Mastodynia: Secondary | ICD-10-CM | POA: Diagnosis not present

## 2023-01-25 DIAGNOSIS — H04123 Dry eye syndrome of bilateral lacrimal glands: Secondary | ICD-10-CM | POA: Diagnosis not present

## 2023-01-25 DIAGNOSIS — H35373 Puckering of macula, bilateral: Secondary | ICD-10-CM | POA: Diagnosis not present

## 2023-01-31 DIAGNOSIS — L570 Actinic keratosis: Secondary | ICD-10-CM | POA: Diagnosis not present

## 2023-01-31 DIAGNOSIS — D225 Melanocytic nevi of trunk: Secondary | ICD-10-CM | POA: Diagnosis not present

## 2023-01-31 DIAGNOSIS — L309 Dermatitis, unspecified: Secondary | ICD-10-CM | POA: Diagnosis not present

## 2023-01-31 DIAGNOSIS — D692 Other nonthrombocytopenic purpura: Secondary | ICD-10-CM | POA: Diagnosis not present

## 2023-01-31 DIAGNOSIS — L821 Other seborrheic keratosis: Secondary | ICD-10-CM | POA: Diagnosis not present

## 2023-02-06 DIAGNOSIS — H35371 Puckering of macula, right eye: Secondary | ICD-10-CM | POA: Diagnosis not present

## 2023-02-06 DIAGNOSIS — H43811 Vitreous degeneration, right eye: Secondary | ICD-10-CM | POA: Diagnosis not present

## 2023-02-06 DIAGNOSIS — H43822 Vitreomacular adhesion, left eye: Secondary | ICD-10-CM | POA: Diagnosis not present

## 2023-02-06 DIAGNOSIS — H35033 Hypertensive retinopathy, bilateral: Secondary | ICD-10-CM | POA: Diagnosis not present

## 2023-02-28 ENCOUNTER — Telehealth: Payer: Self-pay | Admitting: Cardiology

## 2023-02-28 DIAGNOSIS — J449 Chronic obstructive pulmonary disease, unspecified: Secondary | ICD-10-CM | POA: Diagnosis not present

## 2023-02-28 DIAGNOSIS — R058 Other specified cough: Secondary | ICD-10-CM | POA: Diagnosis not present

## 2023-02-28 DIAGNOSIS — J029 Acute pharyngitis, unspecified: Secondary | ICD-10-CM | POA: Diagnosis not present

## 2023-02-28 DIAGNOSIS — R0981 Nasal congestion: Secondary | ICD-10-CM | POA: Diagnosis not present

## 2023-02-28 DIAGNOSIS — J209 Acute bronchitis, unspecified: Secondary | ICD-10-CM | POA: Diagnosis not present

## 2023-02-28 DIAGNOSIS — R5383 Other fatigue: Secondary | ICD-10-CM | POA: Diagnosis not present

## 2023-02-28 DIAGNOSIS — Z1152 Encounter for screening for COVID-19: Secondary | ICD-10-CM | POA: Diagnosis not present

## 2023-02-28 NOTE — Telephone Encounter (Signed)
Patient stated she wants a call back directly from [employer].

## 2023-02-28 NOTE — Telephone Encounter (Signed)
Returned call to patient, informed her Katherine Greer is out of the office until next week.   Patient states she received a letter informing her that [name] will be leaving our practice. She would like [name] recommendation on who she should follow-up with.  She will wait to hear from [facility] when she returns next week.  Will forward to [name] for her recommendation on new cardiologist.

## 2023-03-05 NOTE — Telephone Encounter (Signed)
Katherine Greer, Katherine Greer - 02/28/2023  3:02 PM Meriam Sprague, MD  Sent: Mon March 05, 2023  9:33 AM  To: Loa Socks, LPN         Message  I think Dr. Tenny Craw would be a great option at our [facility] street office   Left her a detailed message that I will have our scheduling dept call her back to cancel her Oct appt with Dr. Shari Prows and schedule her to see Dr. Tenny Craw at that time instead, to establish care with.  Left her a detailed message to call back with any additional questions regarding this.

## 2023-03-05 NOTE — Telephone Encounter (Signed)
Will inquire from Dr. Pemberton who she would like the pt to establish care with.  Will follow-up with the pt accordingly thereafter.  Will also have scheduling reach out to the pt to arrange next follow-up appt with whomever she establishes her cardiac care with.  

## 2023-03-07 ENCOUNTER — Ambulatory Visit
Admission: RE | Admit: 2023-03-07 | Discharge: 2023-03-07 | Disposition: A | Discharge status: Home / Self Care | Payer: Medicare Other | Source: Ambulatory Visit | Attending: Internal Medicine | Admitting: Internal Medicine

## 2023-03-07 DIAGNOSIS — R911 Solitary pulmonary nodule: Secondary | ICD-10-CM

## 2023-03-07 DIAGNOSIS — Z87891 Personal history of nicotine dependence: Secondary | ICD-10-CM | POA: Diagnosis not present

## 2023-03-12 DIAGNOSIS — M810 Age-related osteoporosis without current pathological fracture: Secondary | ICD-10-CM | POA: Diagnosis not present

## 2023-03-12 DIAGNOSIS — G43C1 Periodic headache syndromes in child or adult, intractable: Secondary | ICD-10-CM | POA: Diagnosis not present

## 2023-03-12 DIAGNOSIS — Z0000 Encounter for general adult medical examination without abnormal findings: Secondary | ICD-10-CM | POA: Diagnosis not present

## 2023-03-12 DIAGNOSIS — I701 Atherosclerosis of renal artery: Secondary | ICD-10-CM | POA: Diagnosis not present

## 2023-03-12 DIAGNOSIS — N3281 Overactive bladder: Secondary | ICD-10-CM | POA: Diagnosis not present

## 2023-03-12 DIAGNOSIS — H539 Unspecified visual disturbance: Secondary | ICD-10-CM | POA: Diagnosis not present

## 2023-03-12 DIAGNOSIS — J449 Chronic obstructive pulmonary disease, unspecified: Secondary | ICD-10-CM | POA: Diagnosis not present

## 2023-03-12 DIAGNOSIS — R7301 Impaired fasting glucose: Secondary | ICD-10-CM | POA: Diagnosis not present

## 2023-03-12 DIAGNOSIS — N1831 Chronic kidney disease, stage 3a: Secondary | ICD-10-CM | POA: Diagnosis not present

## 2023-03-12 DIAGNOSIS — E039 Hypothyroidism, unspecified: Secondary | ICD-10-CM | POA: Diagnosis not present

## 2023-03-12 DIAGNOSIS — Z1212 Encounter for screening for malignant neoplasm of rectum: Secondary | ICD-10-CM | POA: Diagnosis not present

## 2023-03-12 DIAGNOSIS — I2510 Atherosclerotic heart disease of native coronary artery without angina pectoris: Secondary | ICD-10-CM | POA: Diagnosis not present

## 2023-03-12 DIAGNOSIS — I129 Hypertensive chronic kidney disease with stage 1 through stage 4 chronic kidney disease, or unspecified chronic kidney disease: Secondary | ICD-10-CM | POA: Diagnosis not present

## 2023-03-12 DIAGNOSIS — I10 Essential (primary) hypertension: Secondary | ICD-10-CM | POA: Diagnosis not present

## 2023-03-12 DIAGNOSIS — E785 Hyperlipidemia, unspecified: Secondary | ICD-10-CM | POA: Diagnosis not present

## 2023-03-12 DIAGNOSIS — I1310 Hypertensive heart and chronic kidney disease without heart failure, with stage 1 through stage 4 chronic kidney disease, or unspecified chronic kidney disease: Secondary | ICD-10-CM | POA: Diagnosis not present

## 2023-03-12 DIAGNOSIS — R82998 Other abnormal findings in urine: Secondary | ICD-10-CM | POA: Diagnosis not present

## 2023-03-14 DIAGNOSIS — N1832 Chronic kidney disease, stage 3b: Secondary | ICD-10-CM | POA: Diagnosis not present

## 2023-03-14 DIAGNOSIS — I701 Atherosclerosis of renal artery: Secondary | ICD-10-CM | POA: Diagnosis not present

## 2023-03-14 DIAGNOSIS — I129 Hypertensive chronic kidney disease with stage 1 through stage 4 chronic kidney disease, or unspecified chronic kidney disease: Secondary | ICD-10-CM | POA: Diagnosis not present

## 2023-03-14 DIAGNOSIS — E669 Obesity, unspecified: Secondary | ICD-10-CM | POA: Diagnosis not present

## 2023-04-19 DIAGNOSIS — R0981 Nasal congestion: Secondary | ICD-10-CM | POA: Diagnosis not present

## 2023-04-19 DIAGNOSIS — J449 Chronic obstructive pulmonary disease, unspecified: Secondary | ICD-10-CM | POA: Diagnosis not present

## 2023-04-19 DIAGNOSIS — R051 Acute cough: Secondary | ICD-10-CM | POA: Diagnosis not present

## 2023-04-19 DIAGNOSIS — J441 Chronic obstructive pulmonary disease with (acute) exacerbation: Secondary | ICD-10-CM | POA: Diagnosis not present

## 2023-04-19 DIAGNOSIS — J029 Acute pharyngitis, unspecified: Secondary | ICD-10-CM | POA: Diagnosis not present

## 2023-04-19 DIAGNOSIS — J069 Acute upper respiratory infection, unspecified: Secondary | ICD-10-CM | POA: Diagnosis not present

## 2023-04-19 DIAGNOSIS — R5383 Other fatigue: Secondary | ICD-10-CM | POA: Diagnosis not present

## 2023-04-19 DIAGNOSIS — Z1152 Encounter for screening for COVID-19: Secondary | ICD-10-CM | POA: Diagnosis not present

## 2023-04-22 ENCOUNTER — Other Ambulatory Visit: Payer: Self-pay | Admitting: Gastroenterology

## 2023-05-15 DIAGNOSIS — Z23 Encounter for immunization: Secondary | ICD-10-CM | POA: Diagnosis not present

## 2023-05-22 ENCOUNTER — Other Ambulatory Visit: Payer: Self-pay

## 2023-05-22 DIAGNOSIS — H43822 Vitreomacular adhesion, left eye: Secondary | ICD-10-CM | POA: Diagnosis not present

## 2023-05-22 DIAGNOSIS — H43811 Vitreous degeneration, right eye: Secondary | ICD-10-CM | POA: Diagnosis not present

## 2023-05-22 DIAGNOSIS — Z961 Presence of intraocular lens: Secondary | ICD-10-CM | POA: Diagnosis not present

## 2023-05-22 DIAGNOSIS — H35033 Hypertensive retinopathy, bilateral: Secondary | ICD-10-CM | POA: Diagnosis not present

## 2023-05-22 DIAGNOSIS — H35371 Puckering of macula, right eye: Secondary | ICD-10-CM | POA: Diagnosis not present

## 2023-05-22 MED ORDER — ATORVASTATIN CALCIUM 80 MG PO TABS: 80.0000 mg | ORAL_TABLET | Freq: Every day | ORAL | 0 refills | Status: DC

## 2023-05-23 ENCOUNTER — Other Ambulatory Visit: Payer: Self-pay

## 2023-05-23 DIAGNOSIS — I10 Essential (primary) hypertension: Secondary | ICD-10-CM

## 2023-05-23 DIAGNOSIS — I2510 Atherosclerotic heart disease of native coronary artery without angina pectoris: Secondary | ICD-10-CM

## 2023-05-23 MED ORDER — SPIRONOLACTONE 25 MG PO TABS: ORAL_TABLET | ORAL | 0 refills | Status: DC

## 2023-05-28 DIAGNOSIS — M13871 Other specified arthritis, right ankle and foot: Secondary | ICD-10-CM | POA: Diagnosis not present

## 2023-05-28 DIAGNOSIS — M2022 Hallux rigidus, left foot: Secondary | ICD-10-CM | POA: Diagnosis not present

## 2023-05-28 DIAGNOSIS — M13872 Other specified arthritis, left ankle and foot: Secondary | ICD-10-CM | POA: Diagnosis not present

## 2023-05-28 DIAGNOSIS — M2021 Hallux rigidus, right foot: Secondary | ICD-10-CM | POA: Diagnosis not present

## 2023-05-29 ENCOUNTER — Ambulatory Visit: Payer: Medicare Other | Admitting: Cardiology

## 2023-07-02 DIAGNOSIS — I701 Atherosclerosis of renal artery: Secondary | ICD-10-CM | POA: Diagnosis not present

## 2023-07-02 DIAGNOSIS — I519 Heart disease, unspecified: Secondary | ICD-10-CM | POA: Diagnosis not present

## 2023-07-02 DIAGNOSIS — U071 COVID-19: Secondary | ICD-10-CM | POA: Diagnosis not present

## 2023-07-02 DIAGNOSIS — J441 Chronic obstructive pulmonary disease with (acute) exacerbation: Secondary | ICD-10-CM | POA: Diagnosis not present

## 2023-07-02 DIAGNOSIS — N3281 Overactive bladder: Secondary | ICD-10-CM | POA: Diagnosis not present

## 2023-07-02 DIAGNOSIS — J069 Acute upper respiratory infection, unspecified: Secondary | ICD-10-CM | POA: Diagnosis not present

## 2023-07-02 DIAGNOSIS — E785 Hyperlipidemia, unspecified: Secondary | ICD-10-CM | POA: Diagnosis not present

## 2023-07-02 DIAGNOSIS — N183 Chronic kidney disease, stage 3 unspecified: Secondary | ICD-10-CM | POA: Diagnosis not present

## 2023-07-02 DIAGNOSIS — N1831 Chronic kidney disease, stage 3a: Secondary | ICD-10-CM | POA: Diagnosis not present

## 2023-07-02 DIAGNOSIS — E039 Hypothyroidism, unspecified: Secondary | ICD-10-CM | POA: Diagnosis not present

## 2023-07-02 DIAGNOSIS — J439 Emphysema, unspecified: Secondary | ICD-10-CM | POA: Diagnosis not present

## 2023-07-02 DIAGNOSIS — I10 Essential (primary) hypertension: Secondary | ICD-10-CM | POA: Diagnosis not present

## 2023-07-02 DIAGNOSIS — M810 Age-related osteoporosis without current pathological fracture: Secondary | ICD-10-CM | POA: Diagnosis not present

## 2023-07-02 DIAGNOSIS — D649 Anemia, unspecified: Secondary | ICD-10-CM | POA: Diagnosis not present

## 2023-07-02 DIAGNOSIS — I129 Hypertensive chronic kidney disease with stage 1 through stage 4 chronic kidney disease, or unspecified chronic kidney disease: Secondary | ICD-10-CM | POA: Diagnosis not present

## 2023-07-05 NOTE — Progress Notes (Signed)
Cardiology Office Note:    Date:  07/06/2023   ID:  Katherine Greer, DOB [birth year], MRN [id number]  PCP:  Rodrigo Ran, MD   [facility] Providers Cardiologist:  Tobias Alexander, MD {  Referring MD: Rodrigo Ran, MD    History of Present Illness:    Katherine Greer is a 72 y.o. female with a long history of intractable headaches and extensive work-up at [facility], hypertension, HLD, CKD stage III, chronic diastolic heart failure, RAS s/p stenting in 04/30/2019 and mild nonobstructive CAD on coronary CTA 2020   TTE in Jan 2020 showed LVEF 60 to 65%  .  The pt was previously followed by Katherine Greer   She was seen in Fall 2023 Since seen she denies CP  Went on trip to [place]  Very active walking   Denies SOB or CP    She did get COVID and has recovered     Notes legs cramp R    R arm also uncomfortable         She has cut back on lipitor to see if helps leg discomfort      Diet:    Breakfast:  Friut, smoothie  Fuit  Cereal, Eggs, bacon, pancakes Coffee  Splenda/half/haf   Lunch  Will skip or have late fruit   Sparkling water and diet crna Or 1/2 sandwich and frui Dinner   Protein   Starch   potatoes    Veggies  Splash cran and water  Desserts/snacks   OUt of hous   Rare    Past Medical History:  Diagnosis Date   Abdominal bloating 05/03/2018   Allergy    Arthritis    Asthma    Cataract    CHF (congestive heart failure) (HCC)    Chronic kidney disease    25% function in the right kidney   Colon polyp    GERD (gastroesophageal reflux disease)    Heart murmur    Hypertension    Osteopenia    Pill dysphagia 05/03/2018   SOB (shortness of breath) 05/03/2018   Thyroid disease    UTI (urinary tract infection)     Past Surgical History:  Procedure Laterality Date   COLONOSCOPY WITH ESOPHAGOGASTRODUODENOSCOPY (EGD)  09/28/2021   [facility]   FOOT SURGERY Bilateral    PERIPHERAL VASCULAR INTERVENTION Right 04/30/2019   Procedure: PERIPHERAL VASCULAR INTERVENTION;  Surgeon: Cephus Shelling, MD;  Location: [facility] INVASIVE CV LAB;  Service: Cardiovascular;  Laterality: Right;  Renal artery   RENAL ANGIOGRAPHY Right 04/30/2019   Procedure: RENAL ANGIOGRAPHY;  Surgeon: Cephus Shelling, MD;  Location: [facility] INVASIVE CV LAB;  Service: Cardiovascular;  Laterality: Right;   ROTATOR CUFF REPAIR Right     Current Medications: Current Meds  Medication Sig   acetaminophen (TYLENOL) 500 MG tablet Take 1,000 mg by mouth every 6 (six) hours as needed for mild pain or moderate pain.    aspirin EC 81 MG tablet Take 81 mg by mouth daily. Swallow whole.   atorvastatin (LIPITOR) 80 MG tablet Take 1 tablet (80 mg total) by mouth daily. (Patient taking differently: Take 40 mg by mouth daily.)   Cholecalciferol 2000 units CAPS Take 2,000 Units by mouth at bedtime.    ezetimibe (ZETIA) 10 MG tablet Take 1 tablet (10 mg total) by mouth daily.   famotidine (PEPCID) 20 MG tablet TAKE 1 TABLET BY MOUTH TWICE DAILY   fluticasone (FLONASE) 50 MCG/ACT nasal spray Place 1 spray into both nostrils daily as needed for allergies  or rhinitis.   furosemide (LASIX) 40 MG tablet Take 1 tablet (40 mg total) by mouth as needed.   gabapentin (NEURONTIN) 100 MG capsule Take 100 mg by mouth 2 (two) times daily.   halobetasol (ULTRAVATE) 0.05 % cream Apply 1 application topically 2 (two) times daily as needed (dermatitis).    ibandronate (BONIVA) 150 MG tablet Take 150 mg by mouth every 30 (thirty) days.   ketoconazole (NIZORAL) 2 % shampoo Apply topically 2 (two) times a week.   levothyroxine (SYNTHROID, LEVOTHROID) 112 MCG tablet Take 112 mcg by mouth daily before breakfast. Pt take 1/2 a tablet daily   nicotine polacrilex (COMMIT) 4 MG lozenge Take 4 mg by mouth as needed for smoking cessation.   omeprazole (PRILOSEC) 40 MG capsule TAKE 1 CAPSULE BY MOUTH EVERY DAY   OZEMPIC, 2 MG/DOSE, 8 MG/3ML SOPN Inject 2 mg into the skin once a week.   spironolactone (ALDACTONE) 25 MG tablet TAKE 1 TABLET(25 MG) BY  MOUTH TWICE DAILY   tolterodine (DETROL LA) 4 MG 24 hr capsule Take 4 mg by mouth daily.   triamcinolone cream (KENALOG) 0.1 % Apply 1 application topically 2 (two) times daily as needed (dermatitis).   Current Facility-Administered Medications for the 07/06/23 encounter (Office Visit) with Pricilla Riffle, MD  Medication   0.9 %  sodium chloride infusion     Allergies:   Augmentin [amoxicillin-pot clavulanate], Keflex [cephalexin], Levaquin [levofloxacin in d5w], Sulfa antibiotics, Carvedilol, Cephalosporins, Contrast media [iodinated contrast media], Depakote er [divalproex sodium er], Latex, Nickel, Nsaids, Tobramycin, Topamax [topiramate], and Tramadol   Social History   Socioeconomic History   Marital status: Married    Spouse name: Not on file   Number of children: 2   Years of education: Not on file   Highest education level: Not on file  Occupational History   Occupation: Passenger transport manager     Comment: retired  Tobacco Use   Smoking status: Former    Current packs/day: 0.00    Average packs/day: 1 pack/day for 40.0 years (40.0 ttl pk-yrs)    Types: Cigarettes    Start date: 75    Quit date: 2012    Years since quitting: 12.8   Smokeless tobacco: Never   Tobacco comments:    Quit 2015, uses nicotine lozenges  Vaping Use   Vaping status: Never Used  Substance and Sexual Activity   Alcohol use: Yes    Comment: wine 4 times a week   Drug use: Never   Sexual activity: Yes  Other Topics Concern   Not on file  Social History Narrative   Not on file   Social Determinants of Health   Financial Resource Strain: Not on file  Food Insecurity: Not on file  Transportation Needs: Not on file  Physical Activity: Not on file  Stress: Not on file  Social Connections: Not on file     Family History: The patient's family history includes Breast cancer in her sister; Cancer in her son; Chronic Renal Failure in her father; Diabetes in her brother and father; Heart attack in her  mother; Lung cancer in her father and sister. There is no history of Colon cancer, Esophageal cancer, Inflammatory bowel disease, Liver disease, Pancreatic cancer, Stomach cancer, Rectal cancer, or Colon polyps.    EKGs/Labs/Other Studies Reviewed:    The following studies were reviewed today:  Echo 2024  Echo  Jan 2024  1. Left ventricular ejection fraction, by estimation, is 65 to 70%. Left  ventricular ejection  fraction by PLAX is 67 %. The left ventricle has  normal function. The left ventricle has no regional wall motion  abnormalities. There is mild left ventricular  hypertrophy. Left ventricular diastolic parameters are consistent with  Grade I diastolic dysfunction (impaired relaxation).   2. Right ventricular systolic function is low normal. The right  ventricular size is normal. Tricuspid regurgitation signal is inadequate  for assessing PA pressure.   3. Mild calcification of the leaflet tips mitral valve leaflet(s).   4. The mitral valve is abnormal. Trivial mitral valve regurgitation.   5. The aortic valve is abnormal. Aortic valve regurgitation is mild to  moderate. Aortic valve sclerosis/calcification is present, without any  evidence of aortic stenosis. Aortic regurgitation PHT measures 380 msec.   6. The inferior vena cava is normal in size with greater than 50%  respiratory variability, suggesting right atrial pressure of 3 mmHg.   Comparison(s): Changes from prior study are noted. 09/18/2018: LVEF 60-65%,  grade 1 DD, mild AI.    CT Chest  02/15/2022: FINDINGS: Cardiovascular: Normal heart size. No pericardial effusion. No significant coronary artery calcifications. Mild atherosclerotic disease of the thoracic aorta.   Mediastinum/Nodes: Small hiatal hernia. No pathologically enlarged lymph nodes seen in the chest.   Lungs/Pleura: Central airways are patent. Mild centrilobular emphysema. No consolidation, pleural effusion or pneumothorax. New tiny solid  pulmonary nodule of the left upper lobe measuring 3 mm on image 53. Other previously seen solid pulmonary nodules are stable.   Upper Abdomen: Calcifications of the left adrenal gland, likely due to prior hemorrhage. Atrophic right kidney. No acute abnormality.   Musculoskeletal: No chest wall mass or suspicious bone lesions identified.  IMPRESSION: 1. Lung-RADS 2, benign appearance or behavior. Continue annual screening with low-dose chest CT without contrast in 12 months. 2. Aortic Atherosclerosis (ICD10-I70.0) and Emphysema (ICD10-J43.9).  Right Renal Artery Doppler  03/29/2021: Summary:  Renal:    Right: Abnormal cortical thickness of right kidney. Abnormal right         Resistive Index. 1-59% stenosis of the distal right renal         artery.   Renal Angiography with PVI 04/30/2019: Pre-operative Diagnosis: High-grade right renal artery stenosis Post-operative diagnosis:  Same Surgeon:  Cephus Shelling, MD Procedure Performed: 1.  Ultrasound-guided access of the right common femoral artery 2.  Aortogram with renal artery arteriogram 3.  Right renal artery angioplasty (4 mm x 15 mm Viatrac) 4.  Right renal artery stent placement (5 mm x 12 mm Herculink) 5.  Mynx closure of the right common femoral artery after limited right femoral arteriogram 6.  43 minutes of monitored moderate conscious sedation time   Indications: Patient is a 72 year old female who was recently seen in clinic by Dr. Arbie Cookey for evaluation of high-grade right renal artery stenosis.  She had both duplex imaging as well as MRI that suggested high-grade right renal artery stenosis in setting of rising blood pressure as well as renal insufficiency with optimal medical management.  She presents after risks and benefits were discussed..   Findings:  Limited aortogram showed high-grade greater than 99% right renal artery stenosis at the ostium.  The left renal artery is widely patent.  After the right renal  artery was cannulated with a 014 wire could not get the catheter to track into the renal artery.  I subsequently predilated the right renal artery lesion with a 4 mm Viatrac.  I then deployed a 5 mm Herculink stent.  There is no  evidence of residual stenosis in the right renal artery and it is widely patent including the stent with no dissection or flow-limiting defect.  The right kidney is smaller than the left suggesting some atrophy.  MRA Abdomen  04/05/2019: IMPRESSION: Findings consistent with significant proximal stenosis involving the origin and immediate proximal segment of a single right renal artery. There is associated right renal atrophy as depicted by prior ultrasound. There are 3 separate left renal arteries that appear to be normally patent.  Renal Artery Doppler  02/13/2019: IMPRESSION: 1. Potential hemodynamically significant stenosis involving the origin of the right renal artery with mild asymmetric atrophy of the right kidney comparison to the left, findings which could be seen in the setting of renal artery stenosis. Further evaluation with CTA of the abdomen and pelvis could be performed as indicated. 2. No evidence of left-sided renal artery stenosis or renal atrophy.  Coronary CTA: 09/2018 1. Coronary calcium score of 46. This was 66 percentile for age and sex matched control. 2. Normal coronary origin with right dominance. 3. Minimal diffuse CAD.  Risk factor modification is recommended.  TTE: 08/2018   Left ventricle: The cavity size was normal. There was mild   concentric hypertrophy. Systolic function was normal. The   estimated ejection fraction was in the range of 60% to 65%. Wall   motion was normal; there were no regional wall motion   abnormalities. Doppler parameters are consistent with abnormal   left ventricular relaxation (grade 1 diastolic dysfunction).   Doppler parameters are consistent with elevated ventricular   end-diastolic filling pressure. -  Aortic valve: There was mild regurgitation. - Right ventricle: The cavity size was normal. Wall thickness was   normal. Systolic function was normal. - Right atrium: The atrium was normal in size. - Tricuspid valve: There was trivial regurgitation. - Pulmonary arteries: Systolic pressure was within the normal   range. - Inferior vena cava: The vessel was normal in size. - Pericardium, extracardiac: There was no pericardial effusion.    EKG:  EKG is personally reviewed. 07/06/2022:  EKG was not ordered. 03/08/2021: NSR, HR 79 bpm  Recent Labs: No results found for requested labs within last 365 days.   Recent Lipid Panel    Component Value Date/Time   CHOL 113 04/21/2022 1047   TRIG 119 04/21/2022 1047   HDL 45 04/21/2022 1047   CHOLHDL 2.5 04/21/2022 1047   LDLCALC 47 04/21/2022 1047     Risk Assessment/Calculations:           Physical Exam:    VS:  BP 112/64   Pulse 73   Ht 5\' 6"  (1.676 m)   Wt 181 lb 3.2 oz (82.2 kg)   SpO2 97%   BMI 29.25 kg/m     Wt Readings from Last 3 Encounters:  07/06/23 181 lb 3.2 oz (82.2 kg)  08/01/22 177 lb (80.3 kg)  07/06/22 183 lb (83 kg)     GEN: Well nourished, well developed in no acute distress HEENT: Normal NECK: No JVD; No carotid bruits CARDIAC: RRR, 2/6 soft systolic murmur.  RESPIRATORY:  Clear to auscultation  ABDOMEN: Soft, non-tender,  MUSCULOSKELETAL:  No edema;   PLAN:    CAD   Mild, nonobstructive  Pt denies angina Follow   REcomm risk factor modification as doing   2  HFpEF TTE in 2020 with normal LVEF, G1DD.  Volume status looks good  Keep on lasix prn -Continue spiro 25mg  daily  -Low Na diet  3  HL Pt is on lipitor  Recently cut back on dose due to LE discomfort / cramping  Follow on this  COnsider possible d/c for a trial if it does not improve     4  Mid aortic insuff    Last echo in 2024  AI mild to moderate   Follow with periodic echoes   5  Renal Artery Stenosis   Pt is s/p  stenting: Doing well, blood pressure controlled.   6  CKD Stage IIIA: Follows with nephrology  7  Metabolics  A1C 6.2 in July 2024   -Reviewed diet   Could cut back on carbs Had been on Ozempic  Not covered   Will see if this is available given medical hx Again, does not preclude diet and exercise   Follow-up in 6 months  Medication Adjustments/Labs and Tests Ordered: Current medicines are reviewed at length with the patient today.  Concerns regarding medicines are outlined above.   Orders Placed This Encounter  Procedures   EKG 12-Lead   No orders of the defined types were placed in this encounter.  There are no Patient Instructions on file for this visit. I,Mitra Faeizi,acting as a Neurosurgeon for [employer], MD.,have documented all relevant documentation on the behalf of Dietrich Pates, MD,as directed by  Dietrich Pates, MD while in the presence of Dietrich Pates, MD.   I, Dietrich Pates, MD, have reviewed all documentation for this visit. The documentation on 07/06/23 for the exam, diagnosis, procedures, and orders are all accurate and complete.   Signed, Dietrich Pates, MD  07/06/2023 9:50 AM    [facility]

## 2023-07-06 ENCOUNTER — Encounter: Payer: Self-pay | Admitting: Internal Medicine

## 2023-07-06 ENCOUNTER — Ambulatory Visit: Payer: Medicare Other | Attending: Cardiology | Admitting: Internal Medicine

## 2023-07-06 VITALS — BP 112/64 | HR 73 | Ht 66.0 in | Wt 181.2 lb

## 2023-07-06 DIAGNOSIS — E782 Mixed hyperlipidemia: Secondary | ICD-10-CM | POA: Diagnosis not present

## 2023-07-06 NOTE — Patient Instructions (Signed)
Medication Instructions:   *If you need a refill on your cardiac medications before your next appointment, please call your pharmacy*   Lab Work:  If you have labs (blood work) drawn today and your tests are completely normal, you will receive your results only by: MyChart Message (if you have MyChart) OR A paper copy in the mail If you have any lab test that is abnormal or we need to change your treatment, we will call you to review the results.   Testing/Procedures:    Follow-Up: At [facility], you and your health needs are our priority.  As part of our continuing mission to provide you with exceptional heart care, we have created designated Provider Care Teams.  These Care Teams include your primary Cardiologist (physician) and Advanced Practice Providers (APPs -  Physician Assistants and Nurse Practitioners) who all work together to provide you with the care you need, when you need it.  We recommend signing up for the patient portal called "MyChart".  Sign up information is provided on this After Visit Summary.  MyChart is used to connect with patients for Virtual Visits (Telemedicine).  Patients are able to view lab/test results, encounter notes, upcoming appointments, etc.  Non-urgent messages can be sent to your provider as well.   To learn more about what you can do with MyChart, go to [email or web address].    Your next appointment:   6 month(s)  Provider:   DR Dietrich Pates    Other Instructions

## 2023-07-09 DIAGNOSIS — M25571 Pain in right ankle and joints of right foot: Secondary | ICD-10-CM | POA: Diagnosis not present

## 2023-07-09 DIAGNOSIS — M13871 Other specified arthritis, right ankle and foot: Secondary | ICD-10-CM | POA: Diagnosis not present

## 2023-07-09 DIAGNOSIS — M13872 Other specified arthritis, left ankle and foot: Secondary | ICD-10-CM | POA: Diagnosis not present

## 2023-07-12 NOTE — Progress Notes (Incomplete)
Cardiology Office Note:    Date:  07/06/2023   ID:  Katherine Greer, DOB Jul 20, 1951, MRN [id number]  PCP:  Rodrigo Ran, MD   [facility] Providers Cardiologist:  Tobias Alexander, MD {  Referring MD: Rodrigo Ran, MD    History of Present Illness:    Katherine Greer is a 72 y.o. female with a long history of intractable headaches and extensive work-up at [facility], hypertension, HLD, CKD stage III, chronic diastolic heart failure, RAS s/p stenting in 04/30/2019 and mild nonobstructive CAD on coronary CTA 2020   TTE in Jan 2020 showed LVEF 60 to 65%  .  The pt was previously followed by Jon Billings   She was seen in Fall 2023 Since seen she denies CP  She is recovering from COVID 1 month ago    Notes legs cramp R    R arm also uncomfortable           Diet:    Breakfast:  Friut, smoothie  Fuit  Cereal, Eggs, bacon, pancakes Coffee  Splenda/half/haf   Lunch  Will skip or have late fruit   Sparkling water and diet crna Or 1/2 sandwich and frui Dinner   Protein   Starch   potatoes    Veggies  Splash cran and water  Desserts/snacks   OUt of hous   Rare    Past Medical History:  Diagnosis Date  . Abdominal bloating 05/03/2018  . Allergy   . Arthritis   . Asthma   . Cataract   . CHF (congestive heart failure) (HCC)   . Chronic kidney disease    25% function in the right kidney  . Colon polyp   . GERD (gastroesophageal reflux disease)   . Heart murmur   . Hypertension   . Osteopenia   . Pill dysphagia 05/03/2018  . SOB (shortness of breath) 05/03/2018  . Thyroid disease   . UTI (urinary tract infection)     Past Surgical History:  Procedure Laterality Date  . COLONOSCOPY WITH ESOPHAGOGASTRODUODENOSCOPY (EGD)  09/28/2021   Mansouraty  . FOOT SURGERY Bilateral   . PERIPHERAL VASCULAR INTERVENTION Right 04/30/2019   Procedure: PERIPHERAL VASCULAR INTERVENTION;  Surgeon: Cephus Shelling, MD;  Location: [facility] INVASIVE CV LAB;  Service: Cardiovascular;  Laterality: Right;  Renal  artery  . RENAL ANGIOGRAPHY Right 04/30/2019   Procedure: RENAL ANGIOGRAPHY;  Surgeon: Cephus Shelling, MD;  Location[facility] INVASIVE CV LAB;  Service: Cardiovascular;  Laterality: Right;  . ROTATOR CUFF REPAIR Right     Current Medications: Current Meds  Medication Sig  . acetaminophen (TYLENOL) 500 MG tablet Take 1,000 mg by mouth every 6 (six) hours as needed for mild pain or moderate pain.   Marland Kitchen aspirin EC 81 MG tablet Take 81 mg by mouth daily. Swallow whole.  Marland Kitchen atorvastatin (LIPITOR) 80 MG tablet Take 1 tablet (80 mg total) by mouth daily. (Patient taking differently: Take 40 mg by mouth daily.)  . Cholecalciferol 2000 units CAPS Take 2,000 Units by mouth at bedtime.   Marland Kitchen ezetimibe (ZETIA) 10 MG tablet Take 1 tablet (10 mg total) by mouth daily.  . famotidine (PEPCID) 20 MG tablet TAKE 1 TABLET BY MOUTH TWICE DAILY  . fluticasone (FLONASE) 50 MCG/ACT nasal spray Place 1 spray into both nostrils daily as needed for allergies or rhinitis.  . furosemide (LASIX) 40 MG tablet Take 1 tablet (40 mg total) by mouth as needed.  . gabapentin (NEURONTIN) 100 MG capsule Take 100 mg by mouth 2 (two)  times daily.  . halobetasol (ULTRAVATE) 0.05 % cream Apply 1 application topically 2 (two) times daily as needed (dermatitis).   . ibandronate (BONIVA) 150 MG tablet Take 150 mg by mouth every 30 (thirty) days.  Marland Kitchen ketoconazole (NIZORAL) 2 % shampoo Apply topically 2 (two) times a week.  . levothyroxine (SYNTHROID, LEVOTHROID) 112 MCG tablet Take 112 mcg by mouth daily before breakfast. Pt take 1/2 a tablet daily  . nicotine polacrilex (COMMIT) 4 MG lozenge Take 4 mg by mouth as needed for smoking cessation.  Marland Kitchen omeprazole (PRILOSEC) 40 MG capsule TAKE 1 CAPSULE BY MOUTH EVERY DAY  . OZEMPIC, 2 MG/DOSE, 8 MG/3ML SOPN Inject 2 mg into the skin once a week.  . spironolactone (ALDACTONE) 25 MG tablet TAKE 1 TABLET(25 MG) BY MOUTH TWICE DAILY  . tolterodine (DETROL LA) 4 MG 24 hr capsule Take 4 mg by mouth  daily.  Marland Kitchen triamcinolone cream (KENALOG) 0.1 % Apply 1 application topically 2 (two) times daily as needed (dermatitis).   Current Facility-Administered Medications for the 07/06/23 encounter (Office Visit) with Pricilla Riffle, MD  Medication  . 0.9 %  sodium chloride infusion     Allergies:   Augmentin [amoxicillin-pot clavulanate], Keflex [cephalexin], Levaquin [levofloxacin in d5w], Sulfa antibiotics, Carvedilol, Cephalosporins, Contrast media [iodinated contrast media], Depakote er [divalproex sodium er], Latex, Nickel, Nsaids, Tobramycin, Topamax [topiramate], and Tramadol   Social History   Socioeconomic History  . Marital status: Married    Spouse name: Not on file  . Number of children: 2  . Years of education: Not on file  . Highest education level: Not on file  Occupational History  . Occupation: Passenger transport manager     Comment: retired  Tobacco Use  . Smoking status: Former    Current packs/day: 0.00    Average packs/day: 1 pack/day for 40.0 years (40.0 ttl pk-yrs)    Types: Cigarettes    Start date: 29    Quit date: 2012    Years since quitting: 12.8  . Smokeless tobacco: Never  . Tobacco comments:    Quit 2015, uses nicotine lozenges  Vaping Use  . Vaping status: Never Used  Substance and Sexual Activity  . Alcohol use: Yes    Comment: wine 4 times a week  . Drug use: Never  . Sexual activity: Yes  Other Topics Concern  . Not on file  Social History Narrative  . Not on file   Social Determinants of Health   Financial Resource Strain: Not on file  Food Insecurity: Not on file  Transportation Needs: Not on file  Physical Activity: Not on file  Stress: Not on file  Social Connections: Not on file     Family History: The patient's family history includes Breast cancer in her sister; Cancer in her son; Chronic Renal Failure in her father; Diabetes in her brother and father; Heart attack in her mother; Lung cancer in her father and sister. There is no history  of Colon cancer, Esophageal cancer, Inflammatory bowel disease, Liver disease, Pancreatic cancer, Stomach cancer, Rectal cancer, or Colon polyps.  ROS:   Review of Systems  Constitutional:  Negative for fever and weight loss.  HENT:  Negative for ear discharge and hearing loss.   Eyes:  Negative for double vision and discharge.  Respiratory:  Negative for cough, hemoptysis and sputum production.   Cardiovascular:  Negative for chest pain, palpitations, orthopnea, claudication, leg swelling and PND.  Gastrointestinal:  Negative for constipation and diarrhea.  Genitourinary:  Negative for flank pain and urgency.  Musculoskeletal:  Positive for myalgias (Bilateral LE). Negative for neck pain.  Neurological:  Negative for speech change, loss of consciousness and headaches.  Endo/Heme/Allergies:  Negative for polydipsia.  Psychiatric/Behavioral:  Negative for suicidal ideas. The patient does not have insomnia.     EKGs/Labs/Other Studies Reviewed:    The following studies were reviewed today:  CT Chest  02/15/2022: FINDINGS: Cardiovascular: Normal heart size. No pericardial effusion. No significant coronary artery calcifications. Mild atherosclerotic disease of the thoracic aorta.   Mediastinum/Nodes: Small hiatal hernia. No pathologically enlarged lymph nodes seen in the chest.   Lungs/Pleura: Central airways are patent. Mild centrilobular emphysema. No consolidation, pleural effusion or pneumothorax. New tiny solid pulmonary nodule of the left upper lobe measuring 3 mm on image 53. Other previously seen solid pulmonary nodules are stable.   Upper Abdomen: Calcifications of the left adrenal gland, likely due to prior hemorrhage. Atrophic right kidney. No acute abnormality.   Musculoskeletal: No chest wall mass or suspicious bone lesions identified.  IMPRESSION: 1. Lung-RADS 2, benign appearance or behavior. Continue annual screening with low-dose chest CT without contrast in 12  months. 2. Aortic Atherosclerosis (ICD10-I70.0) and Emphysema (ICD10-J43.9).  Right Renal Artery Doppler  03/29/2021: Summary:  Renal:    Right: Abnormal cortical thickness of right kidney. Abnormal right         Resistive Index. 1-59% stenosis of the distal right renal         artery.   Renal Angiography with PVI 04/30/2019: Pre-operative Diagnosis: High-grade right renal artery stenosis Post-operative diagnosis:  Same Surgeon:  Cephus Shelling, MD Procedure Performed: 1.  Ultrasound-guided access of the right common femoral artery 2.  Aortogram with renal artery arteriogram 3.  Right renal artery angioplasty (4 mm x 15 mm Viatrac) 4.  Right renal artery stent placement (5 mm x 12 mm Herculink) 5.  Mynx closure of the right common femoral artery after limited right femoral arteriogram 6.  43 minutes of monitored moderate conscious sedation time   Indications: Patient is a 72 year old female who was recently seen in clinic by Dr. Arbie Cookey for evaluation of high-grade right renal artery stenosis.  She had both duplex imaging as well as MRI that suggested high-grade right renal artery stenosis in setting of rising blood pressure as well as renal insufficiency with optimal medical management.  She presents after risks and benefits were discussed..   Findings:  Limited aortogram showed high-grade greater than 99% right renal artery stenosis at the ostium.  The left renal artery is widely patent.  After the right renal artery was cannulated with a 014 wire could not get the catheter to track into the renal artery.  I subsequently predilated the right renal artery lesion with a 4 mm Viatrac.  I then deployed a 5 mm Herculink stent.  There is no evidence of residual stenosis in the right renal artery and it is widely patent including the stent with no dissection or flow-limiting defect.  The right kidney is smaller than the left suggesting some atrophy.  MRA Abdomen   04/05/2019: IMPRESSION: Findings consistent with significant proximal stenosis involving the origin and immediate proximal segment of a single right renal artery. There is associated right renal atrophy as depicted by prior ultrasound. There are 3 separate left renal arteries that appear to be normally patent.  Renal Artery Doppler  02/13/2019: IMPRESSION: 1. Potential hemodynamically significant stenosis involving the origin of the right renal artery with mild asymmetric atrophy of  the right kidney comparison to the left, findings which could be seen in the setting of renal artery stenosis. Further evaluation with CTA of the abdomen and pelvis could be performed as indicated. 2. No evidence of left-sided renal artery stenosis or renal atrophy.  Coronary CTA: 09/2018 1. Coronary calcium score of 46. This was 3 percentile for age and sex matched control. 2. Normal coronary origin with right dominance. 3. Minimal diffuse CAD.  Risk factor modification is recommended.  TTE: 08/2018   Left ventricle: The cavity size was normal. There was mild   concentric hypertrophy. Systolic function was normal. The   estimated ejection fraction was in the range of 60% to 65%. Wall   motion was normal; there were no regional wall motion   abnormalities. Doppler parameters are consistent with abnormal   left ventricular relaxation (grade 1 diastolic dysfunction).   Doppler parameters are consistent with elevated ventricular   end-diastolic filling pressure. - Aortic valve: There was mild regurgitation. - Right ventricle: The cavity size was normal. Wall thickness was   normal. Systolic function was normal. - Right atrium: The atrium was normal in size. - Tricuspid valve: There was trivial regurgitation. - Pulmonary arteries: Systolic pressure was within the normal   range. - Inferior vena cava: The vessel was normal in size. - Pericardium, extracardiac: There was no pericardial effusion.    EKG:   EKG is personally reviewed. 07/06/2022:  EKG was not ordered. 03/08/2021: NSR, HR 79 bpm  Recent Labs: No results found for requested labs within last 365 days.   Recent Lipid Panel    Component Value Date/Time   CHOL 113 04/21/2022 1047   TRIG 119 04/21/2022 1047   HDL 45 04/21/2022 1047   CHOLHDL 2.5 04/21/2022 1047   LDLCALC 47 04/21/2022 1047     Risk Assessment/Calculations:           Physical Exam:    VS:  BP 112/64   Pulse 73   Ht 5\' 6"  (1.676 m)   Wt 181 lb 3.2 oz (82.2 kg)   SpO2 97%   BMI 29.25 kg/m     Wt Readings from Last 3 Encounters:  07/06/23 181 lb 3.2 oz (82.2 kg)  08/01/22 177 lb (80.3 kg)  07/06/22 183 lb (83 kg)     GEN: Well nourished, well developed in no acute distress HEENT: Normal NECK: No JVD; No carotid bruits CARDIAC: RRR, 2/6 soft systolic murmur. No rubs, no gallops RESPIRATORY:  Clear to auscultation without rales, wheezing or rhonchi  ABDOMEN: Soft, non-tender, non-distended MUSCULOSKELETAL:  No edema; No deformity  SKIN: Warm and dry NEUROLOGIC:  Alert and oriented x 3 PSYCHIATRIC:  Normal affect   ASSESSMENT:    1. Mixed hyperlipidemia     PLAN:    In order of problems listed above:  #Chronic Diastolic Heart Failure: TTE in 2020 with normal LVEF, G1DD. Currently appears compensated and euvolemic on examination.  -Continue lasix 40mg  daily as needed -Continue spiro 25mg  daily  -Low Na diet  #Mild Non-Obstructive CAD: No anginal symptoms. -Continue ASA 81mg  daily -Continue lipitor 40mg  daily  #HLD: -Continue lipitor 40mg  daily -LDL controlled at 47 in 03/2022  #Mild AR: Noted on TTE 08/2018 with mild AR. Will continue serial monitoring. -Check TTE for monitoring  #Renal Artery Stenosis s/p stenting: Doing well, blood pressure controlled.  -Continue lipitor 40mg  daily -LDL controlled at 47  #CKD Stage IIIA: Follows with nephrology  #Obesity: -Continue lifestyle modifications as below  Exercise  recommendations:  Goal of exercising for at least 30 minutes a day, at least 5 times per week.  Please exercise to a moderate exertion.  This means that while exercising it is difficult to speak in full sentences, however you are not so short of breath that you feel you must stop, and not so comfortable that you can carry on a full conversation.  Exertion level should be approximately a 5/10, if 10 is the most exertion you can perform.  Diet recommendations: Recommend a heart healthy diet such as the Mediterranean diet.  This diet consists of plant based foods, healthy fats, lean meats, olive oil.  It suggests limiting the intake of simple carbohydrates such as white breads, pastries, and pastas.  It also limits the amount of red meat, wine, and dairy products such as cheese that one should consume on a daily basis.     Follow-up in 6 months  Medication Adjustments/Labs and Tests Ordered: Current medicines are reviewed at length with the patient today.  Concerns regarding medicines are outlined above.   Orders Placed This Encounter  Procedures  . EKG 12-Lead   No orders of the defined types were placed in this encounter.  There are no Patient Instructions on file for this visit. I,Mitra Faeizi,acting as a Neurosurgeon for [employer], MD.,have documented all relevant documentation on the behalf of Dietrich Pates, MD,as directed by  Dietrich Pates, MD while in the presence of Dietrich Pates, MD.   I, Dietrich Pates, MD, have reviewed all documentation for this visit. The documentation on 07/06/23 for the exam, diagnosis, procedures, and orders are all accurate and complete.   Signed, Dietrich Pates, MD  07/06/2023 9:50 AM    [facility]

## 2023-07-19 ENCOUNTER — Other Ambulatory Visit (HOSPITAL_BASED_OUTPATIENT_CLINIC_OR_DEPARTMENT_OTHER): Payer: Self-pay

## 2023-07-20 ENCOUNTER — Other Ambulatory Visit: Payer: Self-pay

## 2023-07-20 DIAGNOSIS — I701 Atherosclerosis of renal artery: Secondary | ICD-10-CM

## 2023-07-23 ENCOUNTER — Institutional Professional Consult (permissible substitution): Payer: Medicare Other | Admitting: Pulmonary Disease

## 2023-08-01 ENCOUNTER — Other Ambulatory Visit: Payer: Self-pay

## 2023-08-01 DIAGNOSIS — E782 Mixed hyperlipidemia: Secondary | ICD-10-CM

## 2023-08-01 MED ORDER — EZETIMIBE 10 MG PO TABS
10.0000 mg | ORAL_TABLET | Freq: Every day | ORAL | 3 refills | Status: DC
  Filled 2024-02-01: qty 90, 90d supply, fill #0
  Filled 2024-05-08: qty 90, 90d supply, fill #1

## 2023-08-02 ENCOUNTER — Ambulatory Visit (HOSPITAL_COMMUNITY)
Admission: RE | Admit: 2023-08-02 | Discharge: 2023-08-02 | Disposition: A | Discharge status: Home / Self Care | Payer: Medicare Other | Source: Ambulatory Visit | Attending: Vascular Surgery | Admitting: Vascular Surgery

## 2023-08-02 ENCOUNTER — Encounter (HOSPITAL_COMMUNITY): Payer: Self-pay

## 2023-08-02 DIAGNOSIS — I701 Atherosclerosis of renal artery: Secondary | ICD-10-CM

## 2023-08-03 ENCOUNTER — Ambulatory Visit (HOSPITAL_COMMUNITY): Payer: Medicare Other

## 2023-08-03 ENCOUNTER — Ambulatory Visit (HOSPITAL_COMMUNITY)
Admission: RE | Admit: 2023-08-03 | Discharge: 2023-08-03 | Disposition: A | Discharge status: Home / Self Care | Payer: Medicare Other | Source: Ambulatory Visit | Attending: Vascular Surgery

## 2023-08-03 DIAGNOSIS — I701 Atherosclerosis of renal artery: Secondary | ICD-10-CM | POA: Insufficient documentation

## 2023-08-07 ENCOUNTER — Other Ambulatory Visit: Payer: Self-pay | Admitting: Internal Medicine

## 2023-08-13 NOTE — Progress Notes (Signed)
Patient name: Katherine Greer MRN: [id number] DOB: [birth year] Sex: female  REASON FOR VISIT: 1 year follow-up right renal artery stent for high-grade renal artery stenosis  HPI: Katherine Greer is a 72 y.o. female presents for 1 year follow-up after right renal artery stent for high-grade stenosis.  Her right renal artery stent was placed on 04/30/2019 via right common femoral artery approach.  She was having HTN with headaches even while on optimal medical management.  Her blood pressures has been very well controlled since stent placement and since her last visit.  She remains on spironolactone and prn lasix.  Blood pressure has been well controlled at home.  She has stopped her statin due to leg pains.  Remains on low-dose aspirin.  Past Medical History:  Diagnosis Date   Abdominal bloating 05/03/2018   Allergy    Arthritis    Asthma    Cataract    CHF (congestive heart failure) (HCC)    Chronic kidney disease    25% function in the right kidney   Colon polyp    GERD (gastroesophageal reflux disease)    Heart murmur    Hypertension    Osteopenia    Pill dysphagia 05/03/2018   SOB (shortness of breath) 05/03/2018   Thyroid disease    UTI (urinary tract infection)     Past Surgical History:  Procedure Laterality Date   COLONOSCOPY WITH ESOPHAGOGASTRODUODENOSCOPY (EGD)  09/28/2021   Mansouraty   FOOT SURGERY Bilateral    PERIPHERAL VASCULAR INTERVENTION Right 04/30/2019   Procedure: PERIPHERAL VASCULAR INTERVENTION;  Surgeon: Cephus Shelling, MD;  Location: [facility] INVASIVE CV LAB;  Service: Cardiovascular;  Laterality: Right;  Renal artery   RENAL ANGIOGRAPHY Right 04/30/2019   Procedure: RENAL ANGIOGRAPHY;  Surgeon: Cephus Shelling, MD;  Location: [facility] INVASIVE CV LAB;  Service: Cardiovascular;  Laterality: Right;   ROTATOR CUFF REPAIR Right     Family History  Problem Relation Age of Onset   Heart attack Mother    Diabetes Father    Chronic Renal Failure Father    Lung  cancer Father    Breast cancer Sister    Lung cancer Sister    Diabetes Brother    Cancer Son        Pituitary- Metastatic Brain   Colon cancer Neg Hx    Esophageal cancer Neg Hx    Inflammatory bowel disease Neg Hx    Liver disease Neg Hx    Pancreatic cancer Neg Hx    Stomach cancer Neg Hx    Rectal cancer Neg Hx    Colon polyps Neg Hx     SOCIAL HISTORY: Social History   Tobacco Use   Smoking status: Former    Current packs/day: 0.00    Average packs/day: 1 pack/day for 40.0 years (40.0 ttl pk-yrs)    Types: Cigarettes    Start date: 29    Quit date: 2012    Years since quitting: 12.9   Smokeless tobacco: Never   Tobacco comments:    Quit 2015, uses nicotine lozenges  Substance Use Topics   Alcohol use: Yes    Comment: wine 4 times a week    Allergies  Allergen Reactions   Augmentin [Amoxicillin-Pot Clavulanate] Anaphylaxis and Rash    Did it involve swelling of the face/tongue/throat, SOB, or low BP? Yes Did it involve sudden or severe rash/hives, skin peeling, or any reaction on the inside of your mouth or nose? Yes Did you need to seek medical attention at  a hospital or doctor's office? No When did it last happen?      18 years If all above answers are "NO", may proceed with cephalosporin use.    Keflex [Cephalexin] Anaphylaxis   Levaquin [Levofloxacin In D5w] Anaphylaxis   Sulfa Antibiotics Anaphylaxis and Rash   Carvedilol Other (See Comments)    Pt reports carvedilol causes her hair loss.    Cephalosporins     Other reaction(s): Other (See Comments) NO REACTION STATED.   Contrast Media [Iodinated Contrast Media]     Lowered kidney function    Depakote Er [Divalproex Sodium Er] Nausea Only   Latex Itching   Nickel Hives    Topical dermatitis sees Dr. Karlyn Agee    Nsaids     Avoid due to kidney function    Tobramycin Swelling    redness   Topamax [Topiramate]     unknown   Tramadol     Per pt this causes flushing and facial swelling     Current Outpatient Medications  Medication Sig Dispense Refill   acetaminophen (TYLENOL) 500 MG tablet Take 1,000 mg by mouth every 6 (six) hours as needed for mild pain or moderate pain.      aspirin EC 81 MG tablet Take 81 mg by mouth daily. Swallow whole.     atorvastatin (LIPITOR) 80 MG tablet TAKE 1 TABLET(80 MG) BY MOUTH DAILY 90 tablet 2   Cholecalciferol 2000 units CAPS Take 2,000 Units by mouth at bedtime.      ezetimibe (ZETIA) 10 MG tablet Take 1 tablet (10 mg total) by mouth daily. 90 tablet 3   famotidine (PEPCID) 20 MG tablet TAKE 1 TABLET BY MOUTH TWICE DAILY 90 tablet 3   fluticasone (FLONASE) 50 MCG/ACT nasal spray Place 1 spray into both nostrils daily as needed for allergies or rhinitis.     furosemide (LASIX) 40 MG tablet Take 1 tablet (40 mg total) by mouth as needed. 30 tablet 11   gabapentin (NEURONTIN) 100 MG capsule Take 100 mg by mouth 2 (two) times daily.     halobetasol (ULTRAVATE) 0.05 % cream Apply 1 application topically 2 (two) times daily as needed (dermatitis).      ibandronate (BONIVA) 150 MG tablet Take 150 mg by mouth every 30 (thirty) days.     ketoconazole (NIZORAL) 2 % shampoo Apply topically 2 (two) times a week.     levothyroxine (SYNTHROID, LEVOTHROID) 112 MCG tablet Take 112 mcg by mouth daily before breakfast. Pt take 1/2 a tablet daily     nicotine polacrilex (COMMIT) 4 MG lozenge Take 4 mg by mouth as needed for smoking cessation.     omeprazole (PRILOSEC) 40 MG capsule TAKE 1 CAPSULE BY MOUTH EVERY DAY 90 capsule 3   OZEMPIC, 2 MG/DOSE, 8 MG/3ML SOPN Inject 2 mg into the skin once a week.     Semaglutide, 2 MG/DOSE, (OZEMPIC, 2 MG/DOSE,) 8 MG/3ML SOPN Inject 2mg  into the skin once weekly. (Patient not taking: Reported on 07/06/2023) 3 mL 3   spironolactone (ALDACTONE) 25 MG tablet TAKE 1 TABLET(25 MG) BY MOUTH TWICE DAILY 180 tablet 0   tolterodine (DETROL LA) 4 MG 24 hr capsule Take 4 mg by mouth daily.     triamcinolone cream (KENALOG) 0.1 %  Apply 1 application topically 2 (two) times daily as needed (dermatitis).     Current Facility-Administered Medications  Medication Dose Route Frequency Provider Last Rate Last Admin   0.9 %  sodium chloride infusion  500 mL Intravenous  Once Mansouraty, Netty Starring., MD        REVIEW OF SYSTEMS:  [X]  denotes positive finding, [ ]  denotes negative finding Cardiac  Comments:  Chest pain or chest pressure:    Shortness of breath upon exertion:    Short of breath when lying flat:    Irregular heart rhythm:        Vascular    Pain in calf, thigh, or hip brought on by ambulation:    Pain in feet at night that wakes you up from your sleep:     Blood clot in your veins:    Leg swelling:         Pulmonary    Oxygen at home:    Productive cough:     Wheezing:         Neurologic    Sudden weakness in arms or legs:     Sudden numbness in arms or legs:     Sudden onset of difficulty speaking or slurred speech:    Temporary loss of vision in one eye:     Problems with dizziness:         Gastrointestinal    Blood in stool:     Vomited blood:         Genitourinary    Burning when urinating:     Blood in urine:        Psychiatric    Major depression:         Hematologic    Bleeding problems:    Problems with blood clotting too easily:        Skin    Rashes or ulcers:        Constitutional    Fever or chills:      PHYSICAL EXAM: There were no vitals filed for this visit.   GENERAL: The patient is a well-nourished female, in no acute distress. The vital signs are documented above. Abdomen: soft, ND, NT Vascular: Palpable femoral pulses bilaterally.    DATA:   Renal duplex today was difficult to visualize right renal stent but normal flow in the mid to distal renal artery and stent appears patent.  Assessment/Plan:  72 year old female who presents for 1 year interval follow-up after right renal artery angioplasty with stent placement on 04/30/19 for high-grade right  renal artery stenosis.  Has no evidence of recurrent high-grade stenosis in the right renal artery.  She also has minimal disease in the left renal artery and no indication for intervention for 1-59% stenosis.  Blood pressures have been well controlled.  Discussed she remain on aspirin for risk reduction and help maintain patency of the stent.  Now unable to tolerate statins.  Will see her again in 1 year with bilateral renal artery duplex.   Cephus Shelling, MD [facility] Office: [phone]

## 2023-08-14 ENCOUNTER — Encounter: Payer: Self-pay | Admitting: Vascular Surgery

## 2023-08-14 ENCOUNTER — Ambulatory Visit (INDEPENDENT_AMBULATORY_CARE_PROVIDER_SITE_OTHER): Payer: Medicare Other | Admitting: Vascular Surgery

## 2023-08-14 ENCOUNTER — Encounter (HOSPITAL_COMMUNITY): Payer: Medicare Other

## 2023-08-14 VITALS — BP 113/72 | HR 81 | Temp 97.3°F | Resp 22 | Ht 66.0 in | Wt 180.6 lb

## 2023-08-14 DIAGNOSIS — I701 Atherosclerosis of renal artery: Secondary | ICD-10-CM

## 2023-08-16 ENCOUNTER — Other Ambulatory Visit: Payer: Self-pay

## 2023-08-16 DIAGNOSIS — I701 Atherosclerosis of renal artery: Secondary | ICD-10-CM

## 2023-08-27 ENCOUNTER — Ambulatory Visit: Payer: Medicare Other | Admitting: Pulmonary Disease

## 2023-08-27 ENCOUNTER — Encounter: Payer: Self-pay | Admitting: Pulmonary Disease

## 2023-08-27 VITALS — BP 120/70 | HR 84 | Ht 67.0 in | Wt 183.8 lb

## 2023-08-27 DIAGNOSIS — J431 Panlobular emphysema: Secondary | ICD-10-CM

## 2023-08-27 DIAGNOSIS — J439 Emphysema, unspecified: Secondary | ICD-10-CM | POA: Diagnosis not present

## 2023-08-27 DIAGNOSIS — J449 Chronic obstructive pulmonary disease, unspecified: Secondary | ICD-10-CM

## 2023-08-27 DIAGNOSIS — Z87891 Personal history of nicotine dependence: Secondary | ICD-10-CM

## 2023-08-27 NOTE — Progress Notes (Signed)
Katherine Greer    [id number]    [birth year]  Primary Care Physician:Perini, Loraine Leriche, MD  Referring Physician: Rodrigo Ran, MD [address] [place],  [place] [address]  Chief complaint:   Patient recently had a CT showing emphysema In for evaluation for COPD  HPI:  Reformed smoker quit in 2018 Was smoking about a pack a day, started smoking in her teenage years  Low-dose CT showing evidence of emphysema  Not demented with activities of daily living  Inhalers have not helped in the past She does have a regular cough  Was able to quit smoking with use of nicotine which she still takes at present  Clear mucus Does not feel under the weather  Stays very active  History of reflux, hypothyroidism, chronic kidney disease  No pertinent occupational history Outpatient Encounter Medications as of 08/27/2023  Medication Sig   acetaminophen (TYLENOL) 500 MG tablet Take 1,000 mg by mouth every 6 (six) hours as needed for mild pain or moderate pain.    aspirin EC 81 MG tablet Take 81 mg by mouth daily. Swallow whole.   atorvastatin (LIPITOR) 80 MG tablet TAKE 1 TABLET(80 MG) BY MOUTH DAILY   Cholecalciferol 2000 units CAPS Take 2,000 Units by mouth at bedtime.    ezetimibe (ZETIA) 10 MG tablet Take 1 tablet (10 mg total) by mouth daily.   famotidine (PEPCID) 20 MG tablet TAKE 1 TABLET BY MOUTH TWICE DAILY   fluticasone (FLONASE) 50 MCG/ACT nasal spray Place 1 spray into both nostrils daily as needed for allergies or rhinitis.   furosemide (LASIX) 40 MG tablet Take 1 tablet (40 mg total) by mouth as needed.   gabapentin (NEURONTIN) 100 MG capsule Take 100 mg by mouth 2 (two) times daily.   halobetasol (ULTRAVATE) 0.05 % cream Apply 1 application topically 2 (two) times daily as needed (dermatitis).    ibandronate (BONIVA) 150 MG tablet Take 150 mg by mouth every 30 (thirty) days.   ketoconazole (NIZORAL) 2 % shampoo Apply topically 2 (two) times a week.   levothyroxine  (SYNTHROID, LEVOTHROID) 112 MCG tablet Take 112 mcg by mouth daily before breakfast. Pt take 1/2 a tablet daily   nicotine polacrilex (COMMIT) 4 MG lozenge Take 4 mg by mouth as needed for smoking cessation.   omeprazole (PRILOSEC) 40 MG capsule TAKE 1 CAPSULE BY MOUTH EVERY DAY   OZEMPIC, 2 MG/DOSE, 8 MG/3ML SOPN Inject 2 mg into the skin once a week.   Semaglutide, 2 MG/DOSE, (OZEMPIC, 2 MG/DOSE,) 8 MG/3ML SOPN Inject 2mg  into the skin once weekly.   spironolactone (ALDACTONE) 25 MG tablet TAKE 1 TABLET(25 MG) BY MOUTH TWICE DAILY   tolterodine (DETROL LA) 4 MG 24 hr capsule Take 4 mg by mouth daily.   triamcinolone cream (KENALOG) 0.1 % Apply 1 application topically 2 (two) times daily as needed (dermatitis).   Facility-Administered Encounter Medications as of 08/27/2023  Medication   0.9 %  sodium chloride infusion    Allergies as of 08/27/2023 - Review Complete 08/27/2023  Allergen Reaction Noted   Augmentin [amoxicillin-pot clavulanate] Anaphylaxis and Rash 12/14/2017   Keflex [cephalexin] Anaphylaxis 12/14/2017   Levaquin [levofloxacin in d5w] Anaphylaxis 12/14/2017   Sulfa antibiotics Anaphylaxis and Rash 12/14/2017   Carvedilol Other (See Comments) 02/13/2020   Cephalosporins  04/24/2011   Contrast media [iodinated contrast media]  04/25/2019   Depakote er [divalproex sodium er] Nausea Only 08/12/2018   Latex Itching 06/28/2018   Nickel Hives 05/02/2019  Nsaids  04/25/2019   Tobramycin Swelling 04/25/2019   Topamax [topiramate]  08/12/2018   Tramadol  02/13/2020    Past Medical History:  Diagnosis Date   Abdominal bloating 05/03/2018   Allergy    Arthritis    Asthma    Cataract    CHF (congestive heart failure) (HCC)    Chronic kidney disease    25% function in the right kidney   Colon polyp    GERD (gastroesophageal reflux disease)    Heart murmur    Hypertension    Osteopenia    Pill dysphagia 05/03/2018   SOB (shortness of breath) 05/03/2018   Thyroid  disease    UTI (urinary tract infection)     Past Surgical History:  Procedure Laterality Date   COLONOSCOPY WITH ESOPHAGOGASTRODUODENOSCOPY (EGD)  09/28/2021   Mansouraty   FOOT SURGERY Bilateral    PERIPHERAL VASCULAR INTERVENTION Right 04/30/2019   Procedure: PERIPHERAL VASCULAR INTERVENTION;  Surgeon: Cephus Shelling, MD;  Location: [facility];  Service: Cardiovascular;  Laterality: Right;  Renal artery   RENAL ANGIOGRAPHY Right 04/30/2019   Procedure: RENAL ANGIOGRAPHY;  Surgeon: Cephus Shelling, MD;  Location: [facility];  Service: Cardiovascular;  Laterality: Right;   ROTATOR CUFF REPAIR Right     Family History  Problem Relation Age of Onset   Heart attack Mother    Diabetes Father    Chronic Renal Failure Father    Lung cancer Father    Breast cancer Sister    Lung cancer Sister    Diabetes Brother    Cancer Son        Pituitary- Metastatic Brain   Colon cancer Neg Hx    Esophageal cancer Neg Hx    Inflammatory bowel disease Neg Hx    Liver disease Neg Hx    Pancreatic cancer Neg Hx    Stomach cancer Neg Hx    Rectal cancer Neg Hx    Colon polyps Neg Hx     Social History   Socioeconomic History   Marital status: Married    Spouse name: Not on file   Number of children: 2   Years of education: Not on file   Highest education level: Not on file  Occupational History   Occupation: Passenger transport manager     Comment: retired  Tobacco Use   Smoking status: Former    Current packs/day: 0.00    Average packs/day: 1 pack/day for 40.0 years (40.0 ttl pk-yrs)    Types: Cigarettes    Start date: 3    Quit date: 2012    Years since quitting: 13.0   Smokeless tobacco: Never   Tobacco comments:    Quit 2015, uses nicotine lozenges  Vaping Use   Vaping status: Never Used  Substance and Sexual Activity   Alcohol use: Yes    Comment: wine 4 times a week   Drug use: Never   Sexual activity: Yes  Other Topics Concern   Not on file   Social History Narrative   Not on file   Social Drivers of Health   Financial Resource Strain: Not on file  Food Insecurity: Not on file  Transportation Needs: Not on file  Physical Activity: Not on file  Stress: Not on file  Social Connections: Not on file  Intimate Partner Violence: Not on file    Review of Systems  Respiratory:  Positive for cough.     Vitals:   08/27/23 1042  BP: 120/70  Pulse:  84  SpO2: 98%     Physical Exam Constitutional:      Appearance: She is obese.  HENT:     Head: Normocephalic.     Mouth/Throat:     Mouth: Mucous membranes are moist.  Eyes:     General: No scleral icterus. Cardiovascular:     Rate and Rhythm: Normal rate and regular rhythm.     Heart sounds: No murmur heard.    No friction rub.  Pulmonary:     Effort: No respiratory distress.     Breath sounds: No stridor. No wheezing or rhonchi.  Musculoskeletal:     Cervical back: No rigidity or tenderness.  Neurological:     Mental Status: She is alert.  Psychiatric:        Mood and Affect: Mood normal.      Data Reviewed: CT reviewed showing extensive emphysema  Assessment:  Chronic obstructive pulmonary disease -Inhalers have not seem to help in the past  Chronic bronchitis  Emphysema  Reformed smoker  Does not appear limited with activities of daily living  Plan/Recommendations: Schedule for pulmonary function test  Graded activities as tolerated  Continue to stay away from cigarette smoke  Follow-up in 3 months  Continue low-dose CT screening  Call with significant concerns  Not starting any inhalers at present as they have not seemed to help patient in the past, decision can be made following PFT   Virl Diamond MD [facility] 08/27/2023, 10:45 AM  CC: Rodrigo Ran, MD

## 2023-08-27 NOTE — Patient Instructions (Signed)
Schedule pulmonary function test  Continue to stay active  Continue yearly follow-up CT scans  Follow-up in 3 months  Call us with significant concerns

## 2023-09-04 ENCOUNTER — Telehealth: Payer: Self-pay | Admitting: *Deleted

## 2023-09-04 DIAGNOSIS — I351 Nonrheumatic aortic (valve) insufficiency: Secondary | ICD-10-CM

## 2023-09-04 NOTE — Addendum Note (Signed)
 Addended by: Loa Socks on: 09/04/2023 01:31 PM   Modules accepted: Orders

## 2023-09-04 NOTE — Telephone Encounter (Signed)
 New echo order placed and will make Echo Scheduler aware of this.   Echo order placed under her new Cardiologist, Dr. Tenny Craw.

## 2023-09-04 NOTE — Telephone Encounter (Signed)
-----   Message from Evart N sent at 09/04/2023 11:53 AM EST ----- Regarding: RE: needs new gen cards Walterine Eans,  Patient saw Dr. Okey back in November ----- Message ----- From: Gladis Eans HERO, LPN Sent: 03/29/7973   9:10 AM EST To: Olam KATHEE Ford; Leah Newnam; [facility]; # Subject: needs new gen cards                            Scheduling team, please call the pt and get her established with a new Cardiologist since Dr. Hobart left.   She has an upcoming echo on 1/9 and we need to place the order under her new Cards.  Please assist today with this and shoot me with who you go with?  Thanks  Eans ----- Message ----- From: Ford Olam KATHEE Sent: 09/04/2023   8:58 AM EST To: Eans HERO Gladis, LPN  Please update order for echo please. Patient is scheduled and order not there.

## 2023-09-05 ENCOUNTER — Ambulatory Visit (HOSPITAL_COMMUNITY): Payer: Medicare Other | Attending: Cardiology

## 2023-09-05 DIAGNOSIS — I351 Nonrheumatic aortic (valve) insufficiency: Secondary | ICD-10-CM | POA: Diagnosis not present

## 2023-09-05 LAB — ECHOCARDIOGRAM COMPLETE
Area-P 1/2: 4.31 cm2
P 1/2 time: 518 ms
S' Lateral: 2.7 cm

## 2023-09-17 DIAGNOSIS — Z8262 Family history of osteoporosis: Secondary | ICD-10-CM | POA: Diagnosis not present

## 2023-09-17 DIAGNOSIS — N958 Other specified menopausal and perimenopausal disorders: Secondary | ICD-10-CM | POA: Diagnosis not present

## 2023-09-17 DIAGNOSIS — Z6829 Body mass index (BMI) 29.0-29.9, adult: Secondary | ICD-10-CM | POA: Diagnosis not present

## 2023-09-17 DIAGNOSIS — Z01419 Encounter for gynecological examination (general) (routine) without abnormal findings: Secondary | ICD-10-CM | POA: Diagnosis not present

## 2023-09-17 DIAGNOSIS — N762 Acute vulvitis: Secondary | ICD-10-CM | POA: Diagnosis not present

## 2023-09-17 DIAGNOSIS — Z13820 Encounter for screening for osteoporosis: Secondary | ICD-10-CM | POA: Diagnosis not present

## 2023-10-01 DIAGNOSIS — I1310 Hypertensive heart and chronic kidney disease without heart failure, with stage 1 through stage 4 chronic kidney disease, or unspecified chronic kidney disease: Secondary | ICD-10-CM | POA: Diagnosis not present

## 2023-10-01 DIAGNOSIS — G720 Drug-induced myopathy: Secondary | ICD-10-CM | POA: Diagnosis not present

## 2023-10-01 DIAGNOSIS — M810 Age-related osteoporosis without current pathological fracture: Secondary | ICD-10-CM | POA: Diagnosis not present

## 2023-10-01 DIAGNOSIS — N3281 Overactive bladder: Secondary | ICD-10-CM | POA: Diagnosis not present

## 2023-10-01 DIAGNOSIS — J439 Emphysema, unspecified: Secondary | ICD-10-CM | POA: Diagnosis not present

## 2023-10-01 DIAGNOSIS — J069 Acute upper respiratory infection, unspecified: Secondary | ICD-10-CM | POA: Diagnosis not present

## 2023-10-01 DIAGNOSIS — E785 Hyperlipidemia, unspecified: Secondary | ICD-10-CM | POA: Diagnosis not present

## 2023-10-01 DIAGNOSIS — R7301 Impaired fasting glucose: Secondary | ICD-10-CM | POA: Diagnosis not present

## 2023-10-01 DIAGNOSIS — M2550 Pain in unspecified joint: Secondary | ICD-10-CM | POA: Diagnosis not present

## 2023-10-01 DIAGNOSIS — E039 Hypothyroidism, unspecified: Secondary | ICD-10-CM | POA: Diagnosis not present

## 2023-10-01 DIAGNOSIS — N1831 Chronic kidney disease, stage 3a: Secondary | ICD-10-CM | POA: Diagnosis not present

## 2023-10-01 DIAGNOSIS — J449 Chronic obstructive pulmonary disease, unspecified: Secondary | ICD-10-CM | POA: Diagnosis not present

## 2023-10-25 DIAGNOSIS — R600 Localized edema: Secondary | ICD-10-CM | POA: Diagnosis not present

## 2023-10-25 DIAGNOSIS — I129 Hypertensive chronic kidney disease with stage 1 through stage 4 chronic kidney disease, or unspecified chronic kidney disease: Secondary | ICD-10-CM | POA: Diagnosis not present

## 2023-10-25 DIAGNOSIS — S81801A Unspecified open wound, right lower leg, initial encounter: Secondary | ICD-10-CM | POA: Diagnosis not present

## 2023-10-27 DIAGNOSIS — M25552 Pain in left hip: Secondary | ICD-10-CM | POA: Diagnosis not present

## 2023-10-27 DIAGNOSIS — M25562 Pain in left knee: Secondary | ICD-10-CM | POA: Diagnosis not present

## 2023-10-29 ENCOUNTER — Other Ambulatory Visit (HOSPITAL_COMMUNITY): Payer: Self-pay

## 2023-10-30 ENCOUNTER — Encounter (HOSPITAL_COMMUNITY): Payer: Medicare Other

## 2023-11-02 ENCOUNTER — Other Ambulatory Visit: Payer: Self-pay

## 2023-11-02 ENCOUNTER — Other Ambulatory Visit (HOSPITAL_COMMUNITY): Payer: Self-pay | Admitting: *Deleted

## 2023-11-02 DIAGNOSIS — S82832A Other fracture of upper and lower end of left fibula, initial encounter for closed fracture: Secondary | ICD-10-CM | POA: Diagnosis not present

## 2023-11-02 DIAGNOSIS — I10 Essential (primary) hypertension: Secondary | ICD-10-CM

## 2023-11-02 DIAGNOSIS — I2510 Atherosclerotic heart disease of native coronary artery without angina pectoris: Secondary | ICD-10-CM

## 2023-11-02 DIAGNOSIS — M7072 Other bursitis of hip, left hip: Secondary | ICD-10-CM | POA: Diagnosis not present

## 2023-11-02 MED ORDER — SPIRONOLACTONE 25 MG PO TABS
25.0000 mg | ORAL_TABLET | Freq: Two times a day (BID) | ORAL | 2 refills | Status: DC
  Filled 2024-03-06: qty 180, 90d supply, fill #0
  Filled 2024-06-13 (×2): qty 180, 90d supply, fill #1

## 2023-11-03 ENCOUNTER — Other Ambulatory Visit: Payer: Self-pay | Admitting: Gastroenterology

## 2023-11-05 ENCOUNTER — Encounter (HOSPITAL_COMMUNITY)
Admission: RE | Admit: 2023-11-05 | Discharge: 2023-11-05 | Disposition: A | Discharge status: Home / Self Care | Source: Ambulatory Visit | Attending: Internal Medicine | Admitting: Internal Medicine

## 2023-11-05 ENCOUNTER — Other Ambulatory Visit: Payer: Self-pay | Admitting: Gastroenterology

## 2023-11-05 DIAGNOSIS — M25641 Stiffness of right hand, not elsewhere classified: Secondary | ICD-10-CM | POA: Diagnosis not present

## 2023-11-05 DIAGNOSIS — M13841 Other specified arthritis, right hand: Secondary | ICD-10-CM | POA: Diagnosis not present

## 2023-11-05 DIAGNOSIS — M25642 Stiffness of left hand, not elsewhere classified: Secondary | ICD-10-CM | POA: Diagnosis not present

## 2023-11-05 DIAGNOSIS — M79642 Pain in left hand: Secondary | ICD-10-CM | POA: Diagnosis not present

## 2023-11-05 DIAGNOSIS — M79641 Pain in right hand: Secondary | ICD-10-CM | POA: Diagnosis not present

## 2023-11-05 DIAGNOSIS — E785 Hyperlipidemia, unspecified: Secondary | ICD-10-CM | POA: Diagnosis not present

## 2023-11-05 MED ORDER — INCLISIRAN SODIUM 284 MG/1.5ML ~~LOC~~ SOSY
PREFILLED_SYRINGE | SUBCUTANEOUS | Status: AC
  Administered 2023-11-05: 284 mg via SUBCUTANEOUS
  Filled 2023-11-05: qty 1.5

## 2023-11-05 MED ORDER — INCLISIRAN SODIUM 284 MG/1.5ML ~~LOC~~ SOSY: 284.0000 mg | PREFILLED_SYRINGE | Freq: Once | SUBCUTANEOUS | Status: AC

## 2023-11-06 ENCOUNTER — Other Ambulatory Visit (HOSPITAL_BASED_OUTPATIENT_CLINIC_OR_DEPARTMENT_OTHER): Payer: Self-pay

## 2023-11-06 MED ORDER — TRIAMCINOLONE ACETONIDE 0.1 % EX OINT
TOPICAL_OINTMENT | CUTANEOUS | 0 refills | Status: DC
Start: 2023-09-25 — End: 2024-04-15

## 2023-11-06 MED ORDER — LEVOTHYROXINE SODIUM 112 MCG PO TABS
112.0000 ug | ORAL_TABLET | Freq: Every day | ORAL | 4 refills | Status: DC
  Filled 2023-11-22: qty 90, 90d supply, fill #0
  Filled 2024-02-22 (×3): qty 90, 90d supply, fill #1

## 2023-11-06 MED ORDER — BENZONATATE 200 MG PO CAPS: 200.0000 mg | ORAL_CAPSULE | Freq: Three times a day (TID) | ORAL | 1 refills | Status: AC | PRN

## 2023-11-06 MED ORDER — LAGEVRIO 200 MG PO CAPS
4.0000 | ORAL_CAPSULE | Freq: Two times a day (BID) | ORAL | 0 refills | Status: AC
Start: 2023-06-12 — End: ?

## 2023-11-06 MED ORDER — GABAPENTIN 100 MG PO CAPS
100.0000 mg | ORAL_CAPSULE | Freq: Two times a day (BID) | ORAL | 3 refills | Status: DC
  Filled 2024-01-04: qty 180, 90d supply, fill #0
  Filled 2024-04-04 – 2024-04-14 (×2): qty 180, 90d supply, fill #1

## 2023-11-06 MED ORDER — TOLTERODINE TARTRATE ER 4 MG PO CP24
4.0000 mg | ORAL_CAPSULE | Freq: Every day | ORAL | 3 refills | Status: DC
  Filled 2024-02-22 (×2): qty 30, 30d supply, fill #0
  Filled 2024-04-04: qty 30, 30d supply, fill #1

## 2023-11-06 MED ORDER — NYSTATIN-TRIAMCINOLONE 100000-0.1 UNIT/GM-% EX CREA
TOPICAL_CREAM | CUTANEOUS | 0 refills | Status: AC
Start: 2023-09-17 — End: ?

## 2023-11-06 MED ORDER — CLINDAMYCIN PHOSPHATE 1 % EX LOTN: TOPICAL_LOTION | CUTANEOUS | 2 refills | Status: AC

## 2023-11-06 MED ORDER — FLUTICASONE PROPIONATE 50 MCG/ACT NA SUSP: 2.0000 | Freq: Every day | NASAL | 3 refills | Status: DC

## 2023-11-15 DIAGNOSIS — M7072 Other bursitis of hip, left hip: Secondary | ICD-10-CM | POA: Diagnosis not present

## 2023-11-15 DIAGNOSIS — M25562 Pain in left knee: Secondary | ICD-10-CM | POA: Diagnosis not present

## 2023-11-15 DIAGNOSIS — S82832A Other fracture of upper and lower end of left fibula, initial encounter for closed fracture: Secondary | ICD-10-CM | POA: Diagnosis not present

## 2023-11-22 ENCOUNTER — Other Ambulatory Visit (HOSPITAL_BASED_OUTPATIENT_CLINIC_OR_DEPARTMENT_OTHER): Payer: Self-pay

## 2023-11-30 DIAGNOSIS — H43811 Vitreous degeneration, right eye: Secondary | ICD-10-CM | POA: Diagnosis not present

## 2023-11-30 DIAGNOSIS — H35371 Puckering of macula, right eye: Secondary | ICD-10-CM | POA: Diagnosis not present

## 2023-11-30 DIAGNOSIS — H43822 Vitreomacular adhesion, left eye: Secondary | ICD-10-CM | POA: Diagnosis not present

## 2023-11-30 DIAGNOSIS — Z961 Presence of intraocular lens: Secondary | ICD-10-CM | POA: Diagnosis not present

## 2023-11-30 DIAGNOSIS — H35033 Hypertensive retinopathy, bilateral: Secondary | ICD-10-CM | POA: Diagnosis not present

## 2023-12-04 DIAGNOSIS — M25562 Pain in left knee: Secondary | ICD-10-CM | POA: Diagnosis not present

## 2023-12-07 DIAGNOSIS — M25562 Pain in left knee: Secondary | ICD-10-CM | POA: Diagnosis not present

## 2023-12-10 ENCOUNTER — Other Ambulatory Visit (HOSPITAL_BASED_OUTPATIENT_CLINIC_OR_DEPARTMENT_OTHER): Payer: Self-pay

## 2023-12-10 ENCOUNTER — Telehealth: Payer: Self-pay | Admitting: Gastroenterology

## 2023-12-10 ENCOUNTER — Other Ambulatory Visit: Payer: Self-pay | Admitting: Gastroenterology

## 2023-12-10 DIAGNOSIS — M7072 Other bursitis of hip, left hip: Secondary | ICD-10-CM | POA: Diagnosis not present

## 2023-12-10 DIAGNOSIS — S82832A Other fracture of upper and lower end of left fibula, initial encounter for closed fracture: Secondary | ICD-10-CM | POA: Diagnosis not present

## 2023-12-10 DIAGNOSIS — M25562 Pain in left knee: Secondary | ICD-10-CM | POA: Diagnosis not present

## 2023-12-10 MED ORDER — FAMOTIDINE 20 MG PO TABS
20.0000 mg | ORAL_TABLET | Freq: Two times a day (BID) | ORAL | 0 refills | Status: DC
  Filled 2023-12-10: qty 60, 30d supply, fill #0

## 2023-12-10 MED ORDER — OMEPRAZOLE 40 MG PO CPDR
40.0000 mg | DELAYED_RELEASE_CAPSULE | Freq: Every day | ORAL | 0 refills | Status: DC
  Filled 2023-12-10: qty 30, 30d supply, fill #0

## 2023-12-10 NOTE — Telephone Encounter (Signed)
 Patient called and stated that she is needing a refill for her Omeprazole and Famotidine refill to [facility] located at [place]. Please advise.

## 2023-12-10 NOTE — Telephone Encounter (Signed)
 1 refill given. Patient needs to schedule office follow-up appointment. No further refills will be given.

## 2023-12-11 DIAGNOSIS — M25562 Pain in left knee: Secondary | ICD-10-CM | POA: Diagnosis not present

## 2023-12-14 DIAGNOSIS — M25562 Pain in left knee: Secondary | ICD-10-CM | POA: Diagnosis not present

## 2023-12-19 DIAGNOSIS — M25562 Pain in left knee: Secondary | ICD-10-CM | POA: Diagnosis not present

## 2023-12-21 DIAGNOSIS — M25562 Pain in left knee: Secondary | ICD-10-CM | POA: Diagnosis not present

## 2023-12-24 DIAGNOSIS — M25562 Pain in left knee: Secondary | ICD-10-CM | POA: Diagnosis not present

## 2023-12-27 DIAGNOSIS — J029 Acute pharyngitis, unspecified: Secondary | ICD-10-CM | POA: Diagnosis not present

## 2023-12-27 DIAGNOSIS — R590 Localized enlarged lymph nodes: Secondary | ICD-10-CM | POA: Diagnosis not present

## 2023-12-27 DIAGNOSIS — J449 Chronic obstructive pulmonary disease, unspecified: Secondary | ICD-10-CM | POA: Diagnosis not present

## 2023-12-27 DIAGNOSIS — J019 Acute sinusitis, unspecified: Secondary | ICD-10-CM | POA: Diagnosis not present

## 2023-12-27 DIAGNOSIS — Z1152 Encounter for screening for COVID-19: Secondary | ICD-10-CM | POA: Diagnosis not present

## 2023-12-27 DIAGNOSIS — R5383 Other fatigue: Secondary | ICD-10-CM | POA: Diagnosis not present

## 2023-12-27 DIAGNOSIS — R059 Cough, unspecified: Secondary | ICD-10-CM | POA: Diagnosis not present

## 2023-12-31 DIAGNOSIS — M25562 Pain in left knee: Secondary | ICD-10-CM | POA: Diagnosis not present

## 2024-01-04 ENCOUNTER — Other Ambulatory Visit: Payer: Self-pay | Admitting: Gastroenterology

## 2024-01-04 ENCOUNTER — Other Ambulatory Visit (HOSPITAL_BASED_OUTPATIENT_CLINIC_OR_DEPARTMENT_OTHER): Payer: Self-pay

## 2024-01-04 DIAGNOSIS — M25562 Pain in left knee: Secondary | ICD-10-CM | POA: Diagnosis not present

## 2024-01-07 ENCOUNTER — Other Ambulatory Visit (HOSPITAL_BASED_OUTPATIENT_CLINIC_OR_DEPARTMENT_OTHER): Payer: Self-pay

## 2024-01-07 MED ORDER — KETOCONAZOLE 2 % EX SHAM
MEDICATED_SHAMPOO | CUTANEOUS | 2 refills | Status: DC
  Filled 2024-01-07: qty 120, 30d supply, fill #0

## 2024-01-07 MED ORDER — KETOCONAZOLE 2 % EX SHAM
MEDICATED_SHAMPOO | CUTANEOUS | 4 refills | Status: AC
  Filled 2024-01-07 – 2024-01-08 (×2): qty 120, 30d supply, fill #0

## 2024-01-08 ENCOUNTER — Other Ambulatory Visit (HOSPITAL_BASED_OUTPATIENT_CLINIC_OR_DEPARTMENT_OTHER): Payer: Self-pay

## 2024-01-09 ENCOUNTER — Telehealth: Payer: Self-pay | Admitting: Gastroenterology

## 2024-01-09 ENCOUNTER — Other Ambulatory Visit (HOSPITAL_BASED_OUTPATIENT_CLINIC_OR_DEPARTMENT_OTHER): Payer: Self-pay

## 2024-01-09 MED ORDER — OMEPRAZOLE 40 MG PO CPDR
40.0000 mg | DELAYED_RELEASE_CAPSULE | Freq: Every day | ORAL | 1 refills | Status: DC
  Filled 2024-01-09: qty 30, 30d supply, fill #0
  Filled 2024-02-01: qty 30, 30d supply, fill #1

## 2024-01-09 MED ORDER — FAMOTIDINE 20 MG PO TABS
20.0000 mg | ORAL_TABLET | Freq: Two times a day (BID) | ORAL | 1 refills | Status: DC
  Filled 2024-01-09: qty 60, 30d supply, fill #0
  Filled 2024-02-01: qty 60, 30d supply, fill #1

## 2024-01-09 NOTE — Telephone Encounter (Signed)
 Refill for Omeprazole  and Famotidine  sent to [facility].

## 2024-01-09 NOTE — Telephone Encounter (Signed)
 PT is calling to have a refill for famotidine  and omeprazole  sent to [facility]. She has scheduled a FU for continued refills for 7/7 but she is completely out of both medications.

## 2024-01-16 DIAGNOSIS — Z1231 Encounter for screening mammogram for malignant neoplasm of breast: Secondary | ICD-10-CM | POA: Diagnosis not present

## 2024-01-19 DIAGNOSIS — S80811A Abrasion, right lower leg, initial encounter: Secondary | ICD-10-CM | POA: Diagnosis not present

## 2024-01-19 DIAGNOSIS — L089 Local infection of the skin and subcutaneous tissue, unspecified: Secondary | ICD-10-CM | POA: Diagnosis not present

## 2024-01-30 ENCOUNTER — Encounter (HOSPITAL_COMMUNITY): Payer: Medicare Other

## 2024-01-31 DIAGNOSIS — H35372 Puckering of macula, left eye: Secondary | ICD-10-CM | POA: Diagnosis not present

## 2024-01-31 DIAGNOSIS — Z961 Presence of intraocular lens: Secondary | ICD-10-CM | POA: Diagnosis not present

## 2024-01-31 DIAGNOSIS — H524 Presbyopia: Secondary | ICD-10-CM | POA: Diagnosis not present

## 2024-02-01 ENCOUNTER — Other Ambulatory Visit: Payer: Self-pay

## 2024-02-01 ENCOUNTER — Other Ambulatory Visit (HOSPITAL_BASED_OUTPATIENT_CLINIC_OR_DEPARTMENT_OTHER): Payer: Self-pay

## 2024-02-04 ENCOUNTER — Telehealth: Payer: Self-pay

## 2024-02-04 ENCOUNTER — Other Ambulatory Visit (HOSPITAL_COMMUNITY): Payer: Self-pay | Admitting: *Deleted

## 2024-02-04 NOTE — Telephone Encounter (Signed)
 Auth Submission: NO AUTH NEEDED Site of care: Site of care: [facility] Payer: Medicare A/B with BCBS supplement Medication & CPT/J Code(s) submitted: Leqvio  (Inclisiran) J1306 Route of submission (phone, fax, portal):  Phone # Fax # Auth type: Buy/Bill PB Units/visits requested: 284mg  x 2 doses Reference number:  Approval from: 02/04/24 to 09/27/24

## 2024-02-05 ENCOUNTER — Encounter (HOSPITAL_COMMUNITY)
Admission: RE | Admit: 2024-02-05 | Discharge: 2024-02-05 | Disposition: A | Discharge status: Home / Self Care | Source: Ambulatory Visit | Attending: Internal Medicine | Admitting: Internal Medicine

## 2024-02-05 DIAGNOSIS — E785 Hyperlipidemia, unspecified: Secondary | ICD-10-CM | POA: Insufficient documentation

## 2024-02-05 MED ORDER — INCLISIRAN SODIUM 284 MG/1.5ML ~~LOC~~ SOSY
284.0000 mg | PREFILLED_SYRINGE | Freq: Once | SUBCUTANEOUS | Status: AC
  Administered 2024-02-05: 284 mg via SUBCUTANEOUS

## 2024-02-05 MED ORDER — INCLISIRAN SODIUM 284 MG/1.5ML ~~LOC~~ SOSY
PREFILLED_SYRINGE | SUBCUTANEOUS | Status: AC
Start: 2024-02-05 — End: ?
  Filled 2024-02-05: qty 1.5

## 2024-02-06 ENCOUNTER — Other Ambulatory Visit (HOSPITAL_BASED_OUTPATIENT_CLINIC_OR_DEPARTMENT_OTHER): Payer: Self-pay

## 2024-02-07 ENCOUNTER — Other Ambulatory Visit: Payer: Self-pay

## 2024-02-07 ENCOUNTER — Other Ambulatory Visit (HOSPITAL_BASED_OUTPATIENT_CLINIC_OR_DEPARTMENT_OTHER): Payer: Self-pay

## 2024-02-07 DIAGNOSIS — E039 Hypothyroidism, unspecified: Secondary | ICD-10-CM | POA: Diagnosis not present

## 2024-02-07 DIAGNOSIS — R7301 Impaired fasting glucose: Secondary | ICD-10-CM | POA: Diagnosis not present

## 2024-02-07 DIAGNOSIS — E66811 Obesity, class 1: Secondary | ICD-10-CM | POA: Diagnosis not present

## 2024-02-07 DIAGNOSIS — N1831 Chronic kidney disease, stage 3a: Secondary | ICD-10-CM | POA: Diagnosis not present

## 2024-02-07 DIAGNOSIS — I129 Hypertensive chronic kidney disease with stage 1 through stage 4 chronic kidney disease, or unspecified chronic kidney disease: Secondary | ICD-10-CM | POA: Diagnosis not present

## 2024-02-07 DIAGNOSIS — E785 Hyperlipidemia, unspecified: Secondary | ICD-10-CM | POA: Diagnosis not present

## 2024-02-07 MED ORDER — FUROSEMIDE 40 MG PO TABS
40.0000 mg | ORAL_TABLET | Freq: Every day | ORAL | 3 refills | Status: AC | PRN
  Filled 2024-02-07: qty 30, 30d supply, fill #0

## 2024-02-08 ENCOUNTER — Other Ambulatory Visit (HOSPITAL_BASED_OUTPATIENT_CLINIC_OR_DEPARTMENT_OTHER): Payer: Self-pay

## 2024-02-22 ENCOUNTER — Other Ambulatory Visit: Payer: Self-pay

## 2024-02-22 ENCOUNTER — Other Ambulatory Visit (HOSPITAL_BASED_OUTPATIENT_CLINIC_OR_DEPARTMENT_OTHER): Payer: Self-pay

## 2024-03-03 ENCOUNTER — Ambulatory Visit: Admitting: Physician Assistant

## 2024-03-03 DIAGNOSIS — R300 Dysuria: Secondary | ICD-10-CM | POA: Diagnosis not present

## 2024-03-03 DIAGNOSIS — R319 Hematuria, unspecified: Secondary | ICD-10-CM | POA: Diagnosis not present

## 2024-03-03 DIAGNOSIS — N760 Acute vaginitis: Secondary | ICD-10-CM | POA: Diagnosis not present

## 2024-03-06 ENCOUNTER — Other Ambulatory Visit (HOSPITAL_BASED_OUTPATIENT_CLINIC_OR_DEPARTMENT_OTHER): Payer: Self-pay

## 2024-03-06 ENCOUNTER — Other Ambulatory Visit: Payer: Self-pay | Admitting: Gastroenterology

## 2024-03-06 MED ORDER — OMEPRAZOLE 40 MG PO CPDR
40.0000 mg | DELAYED_RELEASE_CAPSULE | Freq: Every day | ORAL | 1 refills | Status: DC
  Filled 2024-03-06: qty 30, 30d supply, fill #0
  Filled 2024-04-14: qty 30, 30d supply, fill #1

## 2024-03-06 MED ORDER — FAMOTIDINE 20 MG PO TABS
20.0000 mg | ORAL_TABLET | Freq: Two times a day (BID) | ORAL | 1 refills | Status: DC
  Filled 2024-03-06: qty 60, 30d supply, fill #0
  Filled 2024-04-04: qty 60, 30d supply, fill #1

## 2024-03-11 DIAGNOSIS — L817 Pigmented purpuric dermatosis: Secondary | ICD-10-CM | POA: Diagnosis not present

## 2024-03-24 ENCOUNTER — Other Ambulatory Visit: Payer: Self-pay | Admitting: Internal Medicine

## 2024-03-24 DIAGNOSIS — I129 Hypertensive chronic kidney disease with stage 1 through stage 4 chronic kidney disease, or unspecified chronic kidney disease: Secondary | ICD-10-CM | POA: Diagnosis not present

## 2024-03-24 DIAGNOSIS — N1831 Chronic kidney disease, stage 3a: Secondary | ICD-10-CM | POA: Diagnosis not present

## 2024-03-24 DIAGNOSIS — I701 Atherosclerosis of renal artery: Secondary | ICD-10-CM | POA: Diagnosis not present

## 2024-03-24 DIAGNOSIS — E785 Hyperlipidemia, unspecified: Secondary | ICD-10-CM | POA: Diagnosis not present

## 2024-03-24 DIAGNOSIS — F17200 Nicotine dependence, unspecified, uncomplicated: Secondary | ICD-10-CM

## 2024-03-27 ENCOUNTER — Inpatient Hospital Stay: Admission: RE | Admit: 2024-03-27 | Source: Ambulatory Visit

## 2024-04-04 ENCOUNTER — Other Ambulatory Visit (HOSPITAL_BASED_OUTPATIENT_CLINIC_OR_DEPARTMENT_OTHER): Payer: Self-pay

## 2024-04-04 ENCOUNTER — Other Ambulatory Visit: Payer: Self-pay | Admitting: Gastroenterology

## 2024-04-04 ENCOUNTER — Ambulatory Visit
Admission: RE | Admit: 2024-04-04 | Discharge: 2024-04-04 | Disposition: A | Discharge status: Home / Self Care | Source: Ambulatory Visit | Attending: Internal Medicine | Admitting: Internal Medicine

## 2024-04-04 DIAGNOSIS — Z87891 Personal history of nicotine dependence: Secondary | ICD-10-CM | POA: Diagnosis not present

## 2024-04-04 DIAGNOSIS — F17200 Nicotine dependence, unspecified, uncomplicated: Secondary | ICD-10-CM

## 2024-04-04 MED ORDER — FAMOTIDINE 20 MG PO TABS
20.0000 mg | ORAL_TABLET | Freq: Two times a day (BID) | ORAL | 1 refills | Status: DC
  Filled 2024-04-14: qty 60, 30d supply, fill #0

## 2024-04-08 ENCOUNTER — Other Ambulatory Visit (HOSPITAL_BASED_OUTPATIENT_CLINIC_OR_DEPARTMENT_OTHER): Payer: Self-pay

## 2024-04-14 ENCOUNTER — Other Ambulatory Visit (HOSPITAL_BASED_OUTPATIENT_CLINIC_OR_DEPARTMENT_OTHER): Payer: Self-pay

## 2024-04-14 MED ORDER — TOLTERODINE TARTRATE ER 4 MG PO CP24
4.0000 mg | ORAL_CAPSULE | Freq: Every day | ORAL | 3 refills | Status: DC
  Filled 2024-04-14: qty 90, 90d supply, fill #0

## 2024-04-14 MED ORDER — TOLTERODINE TARTRATE ER 4 MG PO CP24
4.0000 mg | ORAL_CAPSULE | Freq: Every day | ORAL | 3 refills | Status: AC
  Filled 2024-04-14: qty 100, 100d supply, fill #0
  Filled 2024-04-15: qty 90, 90d supply, fill #0
  Filled 2024-07-22: qty 90, 90d supply, fill #1

## 2024-04-15 ENCOUNTER — Encounter: Payer: Self-pay | Admitting: Physician Assistant

## 2024-04-15 ENCOUNTER — Ambulatory Visit (INDEPENDENT_AMBULATORY_CARE_PROVIDER_SITE_OTHER): Admitting: Physician Assistant

## 2024-04-15 ENCOUNTER — Other Ambulatory Visit (HOSPITAL_BASED_OUTPATIENT_CLINIC_OR_DEPARTMENT_OTHER): Payer: Self-pay

## 2024-04-15 VITALS — BP 108/70 | HR 88 | Ht 66.0 in | Wt 184.8 lb

## 2024-04-15 DIAGNOSIS — K219 Gastro-esophageal reflux disease without esophagitis: Secondary | ICD-10-CM

## 2024-04-15 DIAGNOSIS — Z8719 Personal history of other diseases of the digestive system: Secondary | ICD-10-CM

## 2024-04-15 DIAGNOSIS — Z860100 Personal history of colon polyps, unspecified: Secondary | ICD-10-CM | POA: Diagnosis not present

## 2024-04-15 MED ORDER — OMEPRAZOLE 40 MG PO CPDR
40.0000 mg | DELAYED_RELEASE_CAPSULE | Freq: Every day | ORAL | 3 refills | Status: AC
  Filled 2024-04-15 – 2024-05-08 (×2): qty 90, 90d supply, fill #0
  Filled 2024-08-13: qty 90, 90d supply, fill #1

## 2024-04-15 MED ORDER — FAMOTIDINE 20 MG PO TABS
20.0000 mg | ORAL_TABLET | Freq: Two times a day (BID) | ORAL | 3 refills | Status: AC
  Filled 2024-04-15 – 2024-05-08 (×2): qty 180, 90d supply, fill #0
  Filled 2024-08-13: qty 180, 90d supply, fill #1

## 2024-04-15 NOTE — Progress Notes (Signed)
 Chief Complaint: Follow-up GERD  HPI:    Katherine Greer is a 73 year old female with a past medical history as listed below including right renal artery stenosis status post right renal stent placement 04/30/2019, CHF with LVEF 60-65%, CKD, GERD and multiple others, known to Dr. Wilhelmenia, who presents to clinic today for follow-up of GERD.    05/05/2022 patient seen in clinic by Elida Shawl, NP for hemorrhoids and constipation.  At that point told to take MiraLAX daily and apply Desitin as well as Anusol suppositories.    Today, patient presents to clinic and tells me that she is doing well other than she thinks she has a sinus infection and needs to follow-up with her PCP.  She needs refills of Omeprazole  40 mg which she uses once daily and Pepcid  20 mg which she takes twice a day.  She has been on this for years and this works well for her.  She has occasionally missed a dose and she can definitely tell.    Denies fever, chills or weight loss.  GI history: Colonoscopy 09/28/2021: - Hemorrhoids found on digital rectal exam. - A moderate amount of liquid stool was found in the entire colon, interfering with visualization. Lavage of the area was performed using copious amounts, resulting in clearance with with adequate visualization. - Diverticulosis in the recto-sigmoid colon and in the sigmoid colon. - Normal mucosa in the entire examined colon. - Non-bleeding non-thrombosed external and internal hemorrhoids. - 7 year colonoscopy recall   EGD 09/28/2021: - No gross lesions in esophagus. - Z-line regular, 39 cm from the incisors. - 3 cm hiatal hernia. - No gross lesions in the stomach. - No gross lesions in the duodenal bulb, in the first portion of the duodenum and in the second portion of the duodenum.   Colonoscopy 07/03/2018:  - Skin tags were found on perianal exam. - The digital rectal exam was abnormal. Pertinent negatives include no palpable rectal lesions. - The  terminal ileum and ileocecal valve appeared normal. - Six sessile polyps were found in the recto-sigmoid colon, ascending colon and cecum. The polyps were 1 to 6 mm in size. These polyps were removed with a cold snare. Resection and retrieval were complete. - A 8 mm polyp was found in the recto-sigmoid colon. The polyp was sessile. The polyp was removed with a hot snare. Resection and retrieval were complete. - Multiple small-mouthed diverticula were found in the recto-sigmoid colon, sigmoid colon and descending colon. -3 year recall Surgical [P], cecal, ascending, rectosigmoid, polyp (7) - TUBULAR ADENOMA(S). - HYPERPLASTIC POLYP(S) (TWO FRAGMENTS) - HIGH GRADE DYSPLASIA IS NOT IDENTIFIED.   Colonoscopy 8/10/162016 in [place]: 5 polyps removed via forceps or hot snare and pathology reviewed suggested that she had multiple tubular adenomas as well as multiple sessile serrated polyps    Past Medical History:  Diagnosis Date   Abdominal bloating 05/03/2018   Allergy    Arthritis    Asthma    Cataract    CHF (congestive heart failure) (HCC)    Chronic kidney disease    25% function in the right kidney   Colon polyp    GERD (gastroesophageal reflux disease)    Heart murmur    Hypertension    Osteopenia    Pill dysphagia 05/03/2018   SOB (shortness of breath) 05/03/2018   Thyroid  disease    UTI (urinary tract infection)     Past Surgical History:  Procedure Laterality Date   COLONOSCOPY WITH ESOPHAGOGASTRODUODENOSCOPY (EGD)  09/28/2021  Mansouraty   FOOT SURGERY Bilateral    PERIPHERAL VASCULAR INTERVENTION Right 04/30/2019   Procedure: PERIPHERAL VASCULAR INTERVENTION;  Surgeon: Gretta Lonni PARAS, MD;  Location: [facility];  Service: Cardiovascular;  Laterality: Right;  Renal artery   RENAL ANGIOGRAPHY Right 04/30/2019   Procedure: RENAL ANGIOGRAPHY;  Surgeon: Gretta Lonni PARAS, MD;  Location: [facility];  Service: Cardiovascular;  Laterality:  Right;   ROTATOR CUFF REPAIR Right     Current Outpatient Medications  Medication Sig Dispense Refill   acetaminophen  (TYLENOL ) 500 MG tablet Take 1,000 mg by mouth every 6 (six) hours as needed for mild pain or moderate pain.      aspirin  EC 81 MG tablet Take 81 mg by mouth daily. Swallow whole.     atorvastatin  (LIPITOR) 80 MG tablet Take 1 tablet (80 mg total) by mouth daily. 90 tablet 2   benzonatate  (TESSALON ) 200 MG capsule Take 1 capsule (200 mg total) by mouth every 8 (eight) hours as needed for cough. 30 capsule 1   Cholecalciferol 2000 units CAPS Take 2,000 Units by mouth at bedtime.      clindamycin  (CLEOCIN  T) 1 % lotion Apply topically to affected area daily as needed. 60 mL 2   ezetimibe  (ZETIA ) 10 MG tablet Take 1 tablet (10 mg total) by mouth daily. 90 tablet 3   famotidine  (PEPCID ) 20 MG tablet Take 1 tablet (20 mg total) by mouth 2 (two) times daily. 60 tablet 1   fluticasone  (FLONASE ) 50 MCG/ACT nasal spray Place 1 spray into both nostrils daily as needed for allergies or rhinitis.     fluticasone  (FLONASE ) 50 MCG/ACT nasal spray Place 2 sprays into both nostrils daily. 48 g 3   fluticasone  (FLONASE ) 50 MCG/ACT nasal spray Place 2 sprays into both nostrils daily. 16 g 3   furosemide  (LASIX ) 40 MG tablet Take 1 tablet (40 mg total) by mouth as needed. 30 tablet 11   furosemide  (LASIX ) 40 MG tablet Take 1 tablet (40 mg total) by mouth daily as needed for swelling 30 tablet 3   gabapentin  (NEURONTIN ) 100 MG capsule Take 100 mg by mouth 2 (two) times daily.     gabapentin  (NEURONTIN ) 100 MG capsule Take 1 capsule (100 mg total) by mouth 2 (two) times daily. 180 capsule 3   halobetasol (ULTRAVATE) 0.05 % cream Apply 1 application topically 2 (two) times daily as needed (dermatitis).      ibandronate (BONIVA) 150 MG tablet Take 150 mg by mouth every 30 (thirty) days.     ketoconazole  (NIZORAL ) 2 % shampoo Apply topically 2 (two) times a week.     ketoconazole  (NIZORAL ) 2 %  shampoo APPLY DIRECTLY TO SCALP TWICE WEEKLY. LEAVE ON FOR 3 TO 5 MINUTES BEFORE RINSING. 120 mL 2   ketoconazole  (NIZORAL ) 2 % shampoo APPLY DIRECTLY TO SCALP TWICE WEEKLY. LEAVE ON FOR 3 TO 5 MINUTES BEFORE RINSING 120 mL 4   levothyroxine  (SYNTHROID ) 112 MCG tablet Take 1 tablet (112 mcg total) by mouth daily. 90 tablet 4   levothyroxine  (SYNTHROID , LEVOTHROID) 112 MCG tablet Take 112 mcg by mouth daily before breakfast. Pt take 1/2 a tablet daily     molnupiravir  EUA (LAGEVRIO ) 200 MG CAPS capsule Take 4 capsules (800 mg total) by mouth every 12 (twelve) hours. 40 capsule 0   nicotine polacrilex (COMMIT) 4 MG lozenge Take 4 mg by mouth as needed for smoking cessation.     nystatin -triamcinolone  (MYCOLOG II) cream Apply topically to affected area(s)  twice daily in the morning and in the evening. 30 g 0   omeprazole  (PRILOSEC) 40 MG capsule Take 1 capsule (40 mg total) by mouth daily. 30 capsule 1   OZEMPIC , 2 MG/DOSE, 8 MG/3ML SOPN Inject 2 mg into the skin once a week.     Semaglutide , 2 MG/DOSE, (OZEMPIC , 2 MG/DOSE,) 8 MG/3ML SOPN Inject 2mg  into the skin once weekly. 3 mL 3   spironolactone  (ALDACTONE ) 25 MG tablet Take 1 tablet (25 mg total) by mouth 2 (two) times daily. 180 tablet 2   tolterodine  (DETROL  LA) 4 MG 24 hr capsule Take 4 mg by mouth daily.     tolterodine  (DETROL  LA) 4 MG 24 hr capsule Take 1 capsule (4 mg total) by mouth daily. 90 capsule 3   tolterodine  (DETROL  LA) 4 MG 24 hr capsule Take 1 capsule (4 mg total) by mouth daily. 90 capsule 3   tolterodine  (DETROL  LA) 4 MG 24 hr capsule Take 1 capsule (4 mg total) by mouth daily. 100 capsule 3   triamcinolone  cream (KENALOG ) 0.1 % Apply 1 application topically 2 (two) times daily as needed (dermatitis).     triamcinolone  ointment (KENALOG ) 0.1 % Apply a thin layer to affected area(s) twice daily for 2 weeks 30 g 0   Current Facility-Administered Medications  Medication Dose Route Frequency Provider Last Rate Last Admin   0.9 %   sodium chloride  infusion  500 mL Intravenous Once Mansouraty, Aloha Raddle., MD        Allergies as of 04/15/2024 - Review Complete 02/05/2024  Allergen Reaction Noted   Augmentin [amoxicillin-pot clavulanate] Anaphylaxis and Rash 12/14/2017   Keflex [cephalexin] Anaphylaxis 12/14/2017   Levaquin [levofloxacin in d5w] Anaphylaxis 12/14/2017   Sulfa antibiotics Anaphylaxis and Rash 12/14/2017   Carvedilol  Other (See Comments) 02/13/2020   Cephalosporins  04/24/2011   Contrast media [iodinated contrast media]  04/25/2019   Depakote  er [divalproex  sodium er] Nausea Only 08/12/2018   Latex Itching 06/28/2018   Nickel Hives 05/02/2019   Nsaids  04/25/2019   Tobramycin Swelling 04/25/2019   Topamax  [topiramate ]  08/12/2018   Tramadol   02/13/2020    Family History  Problem Relation Age of Onset   Heart attack Mother    Diabetes Father    Chronic Renal Failure Father    Lung cancer Father    Breast cancer Sister    Lung cancer Sister    Diabetes Brother    Cancer Son        Pituitary- Metastatic Brain   Colon cancer Neg Hx    Esophageal cancer Neg Hx    Inflammatory bowel disease Neg Hx    Liver disease Neg Hx    Pancreatic cancer Neg Hx    Stomach cancer Neg Hx    Rectal cancer Neg Hx    Colon polyps Neg Hx     Social History   Socioeconomic History   Marital status: Married    Spouse name: Not on file   Number of children: 2   Years of education: Not on file   Highest education level: Not on file  Occupational History   Occupation: Passenger transport manager     Comment: retired  Tobacco Use   Smoking status: Former    Current packs/day: 0.00    Average packs/day: 1 pack/day for 40.0 years (40.0 ttl pk-yrs)    Types: Cigarettes    Start date: 73    Quit date: 2012    Years since quitting: 13.6   Smokeless tobacco:  Never   Tobacco comments:    Quit 2015, uses nicotine lozenges  Vaping Use   Vaping status: Never Used  Substance and Sexual Activity   Alcohol  use: Yes     Comment: wine 4 times a week   Drug use: Never   Sexual activity: Yes  Other Topics Concern   Not on file  Social History Narrative   Not on file   Social Drivers of Health   Financial Resource Strain: Not on file  Food Insecurity: Not on file  Transportation Needs: Not on file  Physical Activity: Not on file  Stress: Not on file  Social Connections: Not on file  Intimate Partner Violence: Not on file    Review of Systems:    Constitutional: No weight loss, fever or chills Cardiovascular: No chest pain Respiratory: No SOB  Gastrointestinal: See HPI and otherwise negative   Physical Exam:  Vital signs: BP 108/70   Pulse 88   Ht 5' 6 (1.676 m)   Wt 184 lb 12.8 oz (83.8 kg)   BMI 29.83 kg/m    Constitutional:   Pleasant elderly Caucasian female appears to be in NAD, Well developed, Well nourished, alert and cooperative Head:  Normocephalic and atraumatic. Eyes:   PEERL, EOMI. No icterus. Conjunctiva pink. Ears:  Normal auditory acuity. Neck:  Supple Throat: Oral cavity and pharynx without inflammation, swelling or lesion.  Respiratory: Respirations even and unlabored. Lungs clear to auscultation bilaterally.   No wheezes, crackles, or rhonchi.  Cardiovascular: Normal S1, S2. No MRG. Regular rate and rhythm. No peripheral edema, cyanosis or pallor.  Gastrointestinal:  Soft, nondistended, nontender. No rebound or guarding. Normal bowel sounds. No appreciable masses or hepatomegaly. Rectal:  Not performed.  Msk:  Symmetrical without gross deformities. Without edema, no deformity or joint abnormality.  Neurologic:  Alert and  oriented x4;  grossly normal neurologically.  Skin:   Dry and intact without significant lesions or rashes. Psychiatric: Demonstrates good judgement and reason without abnormal affect or behaviors.  No recent labs or imaging.  Assessment: 1.  GERD: Stable on Omeprazole  40 mg daily and Famotidine  20 mg twice daily, has occasionally missed a dose and  knows the difference, previous EGD in 2023 with a hiatal hernia and otherwise normal 2.  History of colon polyps: No polyps on latest colonoscopy in 2023 with consideration for repeat colon in 7 years if medically appropriate  Plan: 1.  Continue Omeprazole  40 mg daily prescribe #90 with 3 refills 2.  Continue Famotidine  20 mg twice daily #180 with 3 refills 3.  Discussed with patient that her next colonoscopy is due in 2030 4.  Patient can have another years worth of refills in a year if she is doing well, but would need to be seen in 2 years for further refills.  Delon Failing, PA-C [facility] 04/15/2024, 9:26 AM  Cc: Shayne Anes, MD

## 2024-04-15 NOTE — Addendum Note (Signed)
 Addended by: WILL POWELL CROME on: 04/15/2024 10:18 AM   Modules accepted: Orders

## 2024-04-15 NOTE — Patient Instructions (Signed)
 We have sent the following medications to your pharmacy for you to pick up at your convenience: Omeprazole  & Famotidine .

## 2024-04-16 NOTE — Progress Notes (Signed)
 Attending Physician's Attestation   I have reviewed the chart.   I agree with the Advanced Practitioner's note, impression, and recommendations with any updates as below.    Corliss Parish, MD [facility] Advanced Endoscopy Office # [phone]

## 2024-04-21 ENCOUNTER — Encounter (INDEPENDENT_AMBULATORY_CARE_PROVIDER_SITE_OTHER): Payer: Self-pay

## 2024-05-08 ENCOUNTER — Other Ambulatory Visit (HOSPITAL_BASED_OUTPATIENT_CLINIC_OR_DEPARTMENT_OTHER): Payer: Self-pay

## 2024-05-08 MED ORDER — LEVOTHYROXINE SODIUM 112 MCG PO TABS
112.0000 ug | ORAL_TABLET | Freq: Every morning | ORAL | 3 refills | Status: AC
  Filled 2024-05-08: qty 100, 100d supply, fill #0

## 2024-05-08 MED ORDER — LEVOTHYROXINE SODIUM 112 MCG PO TABS
112.0000 ug | ORAL_TABLET | Freq: Every morning | ORAL | 4 refills | Status: AC
  Filled 2024-05-08: qty 90, 90d supply, fill #0
  Filled 2024-08-13: qty 90, 90d supply, fill #1

## 2024-05-09 ENCOUNTER — Other Ambulatory Visit (HOSPITAL_BASED_OUTPATIENT_CLINIC_OR_DEPARTMENT_OTHER): Payer: Self-pay

## 2024-05-12 ENCOUNTER — Other Ambulatory Visit (HOSPITAL_COMMUNITY): Payer: Self-pay | Admitting: Internal Medicine

## 2024-05-12 ENCOUNTER — Other Ambulatory Visit (HOSPITAL_COMMUNITY): Payer: Self-pay

## 2024-05-13 ENCOUNTER — Encounter (HOSPITAL_COMMUNITY): Payer: Self-pay | Admitting: Internal Medicine

## 2024-05-13 ENCOUNTER — Other Ambulatory Visit (HOSPITAL_BASED_OUTPATIENT_CLINIC_OR_DEPARTMENT_OTHER): Payer: Self-pay

## 2024-05-13 DIAGNOSIS — Z23 Encounter for immunization: Secondary | ICD-10-CM | POA: Diagnosis not present

## 2024-05-13 MED ORDER — FLUZONE HIGH-DOSE 0.5 ML IM SUSY
0.5000 mL | PREFILLED_SYRINGE | Freq: Once | INTRAMUSCULAR | 0 refills | Status: AC
Start: 2024-05-13 — End: 2024-05-14
  Filled 2024-05-13: qty 0.5, 1d supply, fill #0

## 2024-05-22 ENCOUNTER — Other Ambulatory Visit (HOSPITAL_BASED_OUTPATIENT_CLINIC_OR_DEPARTMENT_OTHER): Payer: Self-pay

## 2024-05-22 DIAGNOSIS — Z23 Encounter for immunization: Secondary | ICD-10-CM | POA: Diagnosis not present

## 2024-05-22 MED ORDER — COMIRNATY 30 MCG/0.3ML IM SUSY
0.3000 mL | PREFILLED_SYRINGE | Freq: Once | INTRAMUSCULAR | 0 refills | Status: AC
  Filled 2024-05-22: qty 0.3, 1d supply, fill #0

## 2024-05-27 DIAGNOSIS — M533 Sacrococcygeal disorders, not elsewhere classified: Secondary | ICD-10-CM | POA: Diagnosis not present

## 2024-05-27 DIAGNOSIS — M25551 Pain in right hip: Secondary | ICD-10-CM | POA: Diagnosis not present

## 2024-06-02 DIAGNOSIS — H35033 Hypertensive retinopathy, bilateral: Secondary | ICD-10-CM | POA: Diagnosis not present

## 2024-06-02 DIAGNOSIS — H43822 Vitreomacular adhesion, left eye: Secondary | ICD-10-CM | POA: Diagnosis not present

## 2024-06-02 DIAGNOSIS — H43811 Vitreous degeneration, right eye: Secondary | ICD-10-CM | POA: Diagnosis not present

## 2024-06-02 DIAGNOSIS — Z961 Presence of intraocular lens: Secondary | ICD-10-CM | POA: Diagnosis not present

## 2024-06-02 DIAGNOSIS — H35371 Puckering of macula, right eye: Secondary | ICD-10-CM | POA: Diagnosis not present

## 2024-06-03 DIAGNOSIS — E785 Hyperlipidemia, unspecified: Secondary | ICD-10-CM | POA: Diagnosis not present

## 2024-06-03 DIAGNOSIS — I129 Hypertensive chronic kidney disease with stage 1 through stage 4 chronic kidney disease, or unspecified chronic kidney disease: Secondary | ICD-10-CM | POA: Diagnosis not present

## 2024-06-03 DIAGNOSIS — E039 Hypothyroidism, unspecified: Secondary | ICD-10-CM | POA: Diagnosis not present

## 2024-06-03 DIAGNOSIS — N1831 Chronic kidney disease, stage 3a: Secondary | ICD-10-CM | POA: Diagnosis not present

## 2024-06-03 DIAGNOSIS — R7301 Impaired fasting glucose: Secondary | ICD-10-CM | POA: Diagnosis not present

## 2024-06-03 DIAGNOSIS — E7849 Other hyperlipidemia: Secondary | ICD-10-CM | POA: Diagnosis not present

## 2024-06-03 DIAGNOSIS — Z1212 Encounter for screening for malignant neoplasm of rectum: Secondary | ICD-10-CM | POA: Diagnosis not present

## 2024-06-03 DIAGNOSIS — M810 Age-related osteoporosis without current pathological fracture: Secondary | ICD-10-CM | POA: Diagnosis not present

## 2024-06-10 DIAGNOSIS — J439 Emphysema, unspecified: Secondary | ICD-10-CM | POA: Diagnosis not present

## 2024-06-10 DIAGNOSIS — G43C1 Periodic headache syndromes in child or adult, intractable: Secondary | ICD-10-CM | POA: Diagnosis not present

## 2024-06-10 DIAGNOSIS — I519 Heart disease, unspecified: Secondary | ICD-10-CM | POA: Diagnosis not present

## 2024-06-10 DIAGNOSIS — F17209 Nicotine dependence, unspecified, with unspecified nicotine-induced disorders: Secondary | ICD-10-CM | POA: Diagnosis not present

## 2024-06-10 DIAGNOSIS — I2510 Atherosclerotic heart disease of native coronary artery without angina pectoris: Secondary | ICD-10-CM | POA: Diagnosis not present

## 2024-06-10 DIAGNOSIS — R911 Solitary pulmonary nodule: Secondary | ICD-10-CM | POA: Diagnosis not present

## 2024-06-10 DIAGNOSIS — E785 Hyperlipidemia, unspecified: Secondary | ICD-10-CM | POA: Diagnosis not present

## 2024-06-10 DIAGNOSIS — R82998 Other abnormal findings in urine: Secondary | ICD-10-CM | POA: Diagnosis not present

## 2024-06-10 DIAGNOSIS — Z0000 Encounter for general adult medical examination without abnormal findings: Secondary | ICD-10-CM | POA: Diagnosis not present

## 2024-06-10 DIAGNOSIS — I1310 Hypertensive heart and chronic kidney disease without heart failure, with stage 1 through stage 4 chronic kidney disease, or unspecified chronic kidney disease: Secondary | ICD-10-CM | POA: Diagnosis not present

## 2024-06-10 DIAGNOSIS — I701 Atherosclerosis of renal artery: Secondary | ICD-10-CM | POA: Diagnosis not present

## 2024-06-10 DIAGNOSIS — I872 Venous insufficiency (chronic) (peripheral): Secondary | ICD-10-CM | POA: Diagnosis not present

## 2024-06-10 DIAGNOSIS — I10 Essential (primary) hypertension: Secondary | ICD-10-CM | POA: Diagnosis not present

## 2024-06-10 DIAGNOSIS — N1831 Chronic kidney disease, stage 3a: Secondary | ICD-10-CM | POA: Diagnosis not present

## 2024-06-12 ENCOUNTER — Ambulatory Visit (INDEPENDENT_AMBULATORY_CARE_PROVIDER_SITE_OTHER): Admitting: Otolaryngology

## 2024-06-12 ENCOUNTER — Encounter (INDEPENDENT_AMBULATORY_CARE_PROVIDER_SITE_OTHER): Payer: Self-pay | Admitting: Otolaryngology

## 2024-06-12 VITALS — BP 113/73 | HR 90 | Temp 98.0°F | Ht 66.5 in | Wt 174.0 lb

## 2024-06-12 DIAGNOSIS — J329 Chronic sinusitis, unspecified: Secondary | ICD-10-CM | POA: Diagnosis not present

## 2024-06-12 DIAGNOSIS — J343 Hypertrophy of nasal turbinates: Secondary | ICD-10-CM | POA: Diagnosis not present

## 2024-06-12 DIAGNOSIS — J342 Deviated nasal septum: Secondary | ICD-10-CM | POA: Diagnosis not present

## 2024-06-12 DIAGNOSIS — Z87891 Personal history of nicotine dependence: Secondary | ICD-10-CM | POA: Diagnosis not present

## 2024-06-12 DIAGNOSIS — J324 Chronic pansinusitis: Secondary | ICD-10-CM | POA: Insufficient documentation

## 2024-06-12 NOTE — Progress Notes (Signed)
 CC: Recurrent sinusitis, chronic nasal congestion  Discussed the use of AI scribe software for clinical note transcription with the patient, who gave verbal consent to proceed.  History of Present Illness Katherine Greer is a 73 year old female who presents with persistent sinus congestion and pressure.  She has been experiencing chronic sinus congestion and pressure for the past three months. Typically, she experiences sinus infections every fall, which resolve after antibiotic treatment. However, this time, despite two courses of antibiotics, her symptoms persist. She describes a constant feeling of being 'blocked' and has ongoing congestion.  She experiences pressure and headaches associated with her sinus issues. She has a history of allergies, particularly to ragweed, which is currently in season. She uses Flonase  daily and takes over-the-counter allergy medications such as Claritin to manage her symptoms. Her allergy history includes positive testing for ragweed, dust, and grass pollens, primarily inhalants from outside.  She has not undergone any ear, nose, and throat surgeries. She experiences significant drainage down her throat, which bothers her. She reports she has not had any imaging studies of her sinuses in the past.   Past Medical History:  Diagnosis Date   Abdominal bloating 05/03/2018   Allergy    Arthritis    Asthma    Cataract    CHF (congestive heart failure) (HCC)    Chronic kidney disease    25% function in the right kidney   Colon polyp    GERD (gastroesophageal reflux disease)    Heart murmur    Hypertension    Osteopenia    Pill dysphagia 05/03/2018   SOB (shortness of breath) 05/03/2018   Thyroid  disease    UTI (urinary tract infection)     Past Surgical History:  Procedure Laterality Date   COLONOSCOPY WITH ESOPHAGOGASTRODUODENOSCOPY (EGD)  09/28/2021   [facility]   FOOT SURGERY Bilateral    PERIPHERAL VASCULAR INTERVENTION Right 04/30/2019    Procedure: PERIPHERAL VASCULAR INTERVENTION;  Surgeon: Gretta Lonni PARAS, MD;  Location: [facility];  Service: Cardiovascular;  Laterality: Right;  Renal artery   RENAL ANGIOGRAPHY Right 04/30/2019   Procedure: RENAL ANGIOGRAPHY;  Surgeon: Gretta Lonni PARAS, MD;  Location: [facility];  Service: Cardiovascular;  Laterality: Right;   ROTATOR CUFF REPAIR Right     Family History  Problem Relation Age of Onset   Heart attack Mother    Diabetes Father    Chronic Renal Failure Father    Lung cancer Father    Breast cancer Sister    Lung cancer Sister    Diabetes Brother    Cancer Son        Pituitary- Metastatic Brain   Colon cancer Neg Hx    Esophageal cancer Neg Hx    Inflammatory bowel disease Neg Hx    Liver disease Neg Hx    Pancreatic cancer Neg Hx    Stomach cancer Neg Hx    Rectal cancer Neg Hx    Colon polyps Neg Hx     Social History:  reports that she quit smoking about 13 years ago. Her smoking use included cigarettes. She started smoking about 53 years ago. She has a 40 pack-year smoking history. She has never used smokeless tobacco. She reports current alcohol  use. She reports that she does not use drugs.  Allergies:  Allergies  Allergen Reactions   Augmentin [Amoxicillin-Pot Clavulanate] Anaphylaxis and Rash    Did it involve swelling of the face/tongue/throat, SOB, or low BP? Yes Did it involve sudden or  severe rash/hives, skin peeling, or any reaction on the inside of your mouth or nose? Yes Did you need to seek medical attention at a hospital or doctor's office? No When did it last happen?      18 years If all above answers are NO, may proceed with cephalosporin use.    Keflex [Cephalexin] Anaphylaxis   Levaquin [Levofloxacin In D5w] Anaphylaxis   Sulfa Antibiotics Anaphylaxis and Rash   Carvedilol  Other (See Comments)    Pt reports carvedilol  causes her hair loss.    Cephalosporins     Other reaction(s): Other (See Comments) NO  REACTION STATED.   Contrast Media [Iodinated Contrast Media]     Lowered kidney function    Depakote  Er [Divalproex  Sodium Er] Nausea Only   Latex Itching   Nickel Hives    Topical dermatitis sees Dr. Rolan Molt    Nsaids     Avoid due to kidney function    Tobramycin Swelling    redness   Topamax  [Topiramate ]     unknown   Tramadol      Per pt this causes flushing and facial swelling    Prior to Admission medications   Medication Sig Start Date End Date Taking? Authorizing Provider  acetaminophen  (TYLENOL ) 500 MG tablet Take 1,000 mg by mouth every 6 (six) hours as needed for mild pain or moderate pain.    Yes [provider]  aspirin  EC 81 MG tablet Take 81 mg by mouth daily. Swallow whole.   Yes [provider]  benzonatate  (TESSALON ) 200 MG capsule Take 1 capsule (200 mg total) by mouth every 8 (eight) hours as needed for cough. 02/28/23  Yes Drinkard, Beverley, NP  Cholecalciferol 2000 units CAPS Take 2,000 Units by mouth at bedtime.  02/08/15  Yes [provider]  clindamycin  (CLEOCIN  T) 1 % lotion Apply topically to affected area daily as needed. 09/12/23  Yes Molt Sieving, MD  ezetimibe  (ZETIA ) 10 MG tablet Take 1 tablet (10 mg total) by mouth daily. 08/01/23  Yes Okey Vina GAILS, MD  famotidine  (PEPCID ) 20 MG tablet Take 1 tablet (20 mg total) by mouth 2 (two) times daily. 04/15/24  Yes Beather Delon Gibson, PA  fluticasone  (FLONASE ) 50 MCG/ACT nasal spray Place 1 spray into both nostrils daily as needed for allergies or rhinitis.   Yes [provider]  furosemide  (LASIX ) 40 MG tablet Take 1 tablet (40 mg total) by mouth daily as needed for swelling 02/07/24  Yes   gabapentin  (NEURONTIN ) 100 MG capsule Take 1 capsule (100 mg total) by mouth 2 (two) times daily. 06/11/23  Yes   halobetasol (ULTRAVATE) 0.05 % cream Apply 1 application topically 2 (two) times daily as needed (dermatitis).  11/27/18  Yes [provider]  ibandronate (BONIVA) 150 MG  tablet Take 150 mg by mouth every 30 (thirty) days. 10/27/19  Yes [provider]  ketoconazole  (NIZORAL ) 2 % shampoo APPLY DIRECTLY TO SCALP TWICE WEEKLY. LEAVE ON FOR 3 TO 5 MINUTES BEFORE RINSING 01/07/24  Yes   levothyroxine  (SYNTHROID ) 112 MCG tablet Take 1 tablet (112 mcg total) by mouth in the morning on an empty stomach 05/08/24  Yes   levothyroxine  (SYNTHROID ) 112 MCG tablet Take 1 tablet (112 mcg total) by mouth every morning on an empty stomach. 05/08/24  Yes   molnupiravir  EUA (LAGEVRIO ) 200 MG CAPS capsule Take 4 capsules (800 mg total) by mouth every 12 (twelve) hours. 06/12/23  Yes   nicotine polacrilex (COMMIT) 4 MG lozenge Take 4  mg by mouth as needed for smoking cessation.   Yes [provider]  nystatin -triamcinolone  (MYCOLOG II) cream Apply topically to affected area(s) twice daily in the morning and in the evening. 09/17/23  Yes   omeprazole  (PRILOSEC) 40 MG capsule Take 1 capsule (40 mg total) by mouth daily. 04/15/24  Yes Beather Delon Gibson, PA  spironolactone  (ALDACTONE ) 25 MG tablet Take 1 tablet (25 mg total) by mouth 2 (two) times daily. 11/02/23  Yes Okey Vina GAILS, MD  tirzepatide (ZEPBOUND) 7.5 MG/0.5ML injection vial inject 7.5mg  Subcutaneous weekly; Duration: 30 days dx e66.811, r73.01, I12.9, E78.5 03/27/24  Yes [provider]  tolterodine  (DETROL  LA) 4 MG 24 hr capsule Take 1 capsule (4 mg total) by mouth daily. 04/14/24  Yes     Blood pressure 113/73, pulse 90, temperature 98 F (36.7 C), temperature source Oral, height 5' 6.5 (1.689 m), weight 174 lb (78.9 kg), SpO2 98%. Exam: General: Communicates without difficulty, well nourished, no acute distress. Head: Normocephalic, no evidence injury, no tenderness, facial buttresses intact without stepoff. Face/sinus: No tenderness to palpation and percussion. Facial movement is normal and symmetric. Eyes: PERRL, EOMI. No scleral icterus, conjunctivae clear. Neuro: CN II exam reveals vision grossly  intact.  No nystagmus at any point of gaze. Ears: Auricles well formed without lesions.  Ear canals are intact without mass or lesion.  No erythema or edema is appreciated.  The TMs are intact without fluid. Nose: External evaluation reveals normal support and skin without lesions.  Dorsum is intact.  Anterior rhinoscopy reveals congested mucosa over anterior aspect of inferior turbinates and intact septum.  Oral:  Oral cavity and oropharynx are intact, symmetric, without erythema or edema.  Mucosa is moist without lesions. Neck: Full range of motion without pain.  There is no significant lymphadenopathy.  No masses palpable.  Thyroid  bed within normal limits to palpation.  Parotid glands and submandibular glands equal bilaterally without mass.  Trachea is midline. Neuro:  CN 2-12 grossly intact.   Procedure:  Flexible Nasal Endoscopy: Description: Risks, benefits, and alternatives of flexible endoscopy were explained to the patient.  Specific mention was made of the risk of throat numbness with difficulty swallowing, possible bleeding from the nose and mouth, and pain from the procedure.  The patient gave oral consent to proceed.  The flexible scope was inserted into the right nasal cavity.  Endoscopy of the interior nasal cavity, superior, inferior, and middle meatus was performed. The sphenoid-ethmoid recess was examined. Edematous mucosa was noted.  No polyp, mass, or lesion was appreciated. Nasal septal deviation noted. Olfactory cleft was clear.  Nasopharynx was clear.  Turbinates were hypertrophied but without mass.  The procedure was repeated on the contralateral side with similar findings.  The patient tolerated the procedure well.    Assessment and Plan Assessment & Plan Chronic rhinosinusitis Chronic sinus congestion with associated allergic rhinitis, persisting for three months. Symptoms include constant congestion, pressure, headache, and post-nasal drainage. Ragweed allergy present.  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    Katherine Greer 06/12/2024, 4:38 PM

## 2024-06-13 ENCOUNTER — Other Ambulatory Visit (HOSPITAL_BASED_OUTPATIENT_CLINIC_OR_DEPARTMENT_OTHER): Payer: Self-pay

## 2024-06-13 ENCOUNTER — Other Ambulatory Visit: Payer: Self-pay

## 2024-06-25 DIAGNOSIS — D692 Other nonthrombocytopenic purpura: Secondary | ICD-10-CM | POA: Diagnosis not present

## 2024-06-25 DIAGNOSIS — D225 Melanocytic nevi of trunk: Secondary | ICD-10-CM | POA: Diagnosis not present

## 2024-06-25 DIAGNOSIS — L570 Actinic keratosis: Secondary | ICD-10-CM | POA: Diagnosis not present

## 2024-06-25 DIAGNOSIS — L821 Other seborrheic keratosis: Secondary | ICD-10-CM | POA: Diagnosis not present

## 2024-07-03 ENCOUNTER — Telehealth (INDEPENDENT_AMBULATORY_CARE_PROVIDER_SITE_OTHER): Payer: Self-pay

## 2024-07-03 NOTE — Telephone Encounter (Signed)
 Called patient back regarding VM she left about scheduling her CT with radiology. I called back and LVM with radiology scheduling number explaining to her to call them.

## 2024-07-05 ENCOUNTER — Ambulatory Visit (HOSPITAL_BASED_OUTPATIENT_CLINIC_OR_DEPARTMENT_OTHER)
Admission: RE | Admit: 2024-07-05 | Discharge: 2024-07-05 | Disposition: A | Source: Ambulatory Visit | Attending: Otolaryngology | Admitting: Otolaryngology

## 2024-07-05 DIAGNOSIS — J324 Chronic pansinusitis: Secondary | ICD-10-CM | POA: Insufficient documentation

## 2024-07-07 ENCOUNTER — Ambulatory Visit (INDEPENDENT_AMBULATORY_CARE_PROVIDER_SITE_OTHER): Admitting: Otolaryngology

## 2024-07-14 ENCOUNTER — Ambulatory Visit (INDEPENDENT_AMBULATORY_CARE_PROVIDER_SITE_OTHER): Admitting: Otolaryngology

## 2024-07-14 ENCOUNTER — Encounter (INDEPENDENT_AMBULATORY_CARE_PROVIDER_SITE_OTHER): Payer: Self-pay | Admitting: Otolaryngology

## 2024-07-14 VITALS — BP 121/80 | HR 97 | Temp 97.3°F | Ht 66.5 in | Wt 172.0 lb

## 2024-07-14 DIAGNOSIS — J343 Hypertrophy of nasal turbinates: Secondary | ICD-10-CM

## 2024-07-14 DIAGNOSIS — J342 Deviated nasal septum: Secondary | ICD-10-CM | POA: Diagnosis not present

## 2024-07-14 DIAGNOSIS — J310 Chronic rhinitis: Secondary | ICD-10-CM

## 2024-07-15 DIAGNOSIS — J310 Chronic rhinitis: Secondary | ICD-10-CM | POA: Insufficient documentation

## 2024-07-15 NOTE — Progress Notes (Signed)
 Patient ID: Katherine Greer, female   DOB: 08/28/1951, 73 y.o.   MRN: [id number]  Follow-up: Chronic nasal obstruction, recurrent sinusitis  HPI: The patient is a 73 year old female who presents today for her follow-up evaluation.  The patient was last seen in October 2025.  At that time, she was complaining of chronic nasal obstruction, recurrent facial pressure, and recurrent sinus infections.  She has a history of of environmental allergies, and was treated with Claritin and Flonase .  At her last visit, she was noted to have nasal mucosal congestion, nasal septal deviation, and bilateral inferior turbinate hypertrophy.  She subsequently underwent a sinus CT scan.  The CT showed no significant acute or chronic sinusitis.  However, nasal septal deviation and bilateral inferior turbinate hypertrophy were noted.  The patient returns today complaining of persistent nasal obstruction.  She also has frequent facial pressure.  She denies any fever or visual change.  Exam: General: Communicates without difficulty, well nourished, no acute distress. Head: Normocephalic, no evidence injury, no tenderness, facial buttresses intact without stepoff. Face/sinus: No tenderness to palpation and percussion. Facial movement is normal and symmetric. Eyes: PERRL, EOMI. No scleral icterus, conjunctivae clear. Neuro: CN II exam reveals vision grossly intact.  No nystagmus at any point of gaze. Ears: Auricles well formed without lesions.  Ear canals are intact without mass or lesion.  No erythema or edema is appreciated.  The TMs are intact without fluid. Nose: External evaluation reveals normal support and skin without lesions.  Dorsum is intact.  Anterior rhinoscopy reveals congested mucosa over anterior aspect of inferior turbinates and deviated septum.  No purulence noted. Oral:  Oral cavity and oropharynx are intact, symmetric, without erythema or edema.  Mucosa is moist without lesions. Neck: Full range of motion without pain.   There is no significant lymphadenopathy.  No masses palpable.  Thyroid  bed within normal limits to palpation.  Parotid glands and submandibular glands equal bilaterally without mass.  Trachea is midline. Neuro:  CN 2-12 grossly intact.   Assessment: 1.  Chronic rhinitis with nasal mucosal congestion, nasal septal deviation, and bilateral inferior turbinate hypertrophy. 2.  No significant acute or chronic sinusitis was noted on her CT scan.  Plan: 1.  The physical exam findings and the CT images are reviewed with the patient. 2.  She is reassured that no acute infection is noted today. 3.  Continue with Flonase  nasal spray and Claritin daily. 4.  Nasal saline irrigation is encouraged. 5.  If her symptoms persist, she may benefit from surgical intervention with septoplasty and bilateral turbinate reduction.  The risk, benefits, and details of the procedure are discussed. 6.  The patient will need to consider her options.  She is encouraged to call with any questions or concerns.

## 2024-07-21 ENCOUNTER — Other Ambulatory Visit (HOSPITAL_BASED_OUTPATIENT_CLINIC_OR_DEPARTMENT_OTHER): Payer: Self-pay

## 2024-07-21 MED ORDER — ZEPBOUND 7.5 MG/0.5ML ~~LOC~~ SOAJ
7.5000 mg | SUBCUTANEOUS | 4 refills | Status: AC
  Filled 2024-07-21 – 2024-07-22 (×2): qty 2, 28d supply, fill #0

## 2024-07-22 ENCOUNTER — Other Ambulatory Visit (HOSPITAL_BASED_OUTPATIENT_CLINIC_OR_DEPARTMENT_OTHER): Payer: Self-pay

## 2024-07-22 MED ORDER — GABAPENTIN 100 MG PO CAPS
100.0000 mg | ORAL_CAPSULE | Freq: Two times a day (BID) | ORAL | 3 refills | Status: AC
Start: 2024-07-22 — End: ?
  Filled 2024-07-22: qty 180, 90d supply, fill #0

## 2024-07-25 ENCOUNTER — Other Ambulatory Visit (HOSPITAL_BASED_OUTPATIENT_CLINIC_OR_DEPARTMENT_OTHER): Payer: Self-pay

## 2024-08-06 ENCOUNTER — Ambulatory Visit (HOSPITAL_COMMUNITY)
Admission: RE | Admit: 2024-08-06 | Discharge: 2024-08-06 | Disposition: A | Discharge status: Home / Self Care | Source: Ambulatory Visit | Attending: Internal Medicine | Admitting: Internal Medicine

## 2024-08-06 VITALS — BP 107/72 | HR 102 | Temp 97.1°F | Resp 16

## 2024-08-06 DIAGNOSIS — E785 Hyperlipidemia, unspecified: Secondary | ICD-10-CM | POA: Insufficient documentation

## 2024-08-06 MED ORDER — INCLISIRAN SODIUM 284 MG/1.5ML ~~LOC~~ SOSY
284.0000 mg | PREFILLED_SYRINGE | Freq: Once | SUBCUTANEOUS | Status: AC
  Administered 2024-08-06: 284 mg via SUBCUTANEOUS

## 2024-08-06 MED ORDER — INCLISIRAN SODIUM 284 MG/1.5ML ~~LOC~~ SOSY
PREFILLED_SYRINGE | SUBCUTANEOUS | Status: AC
  Filled 2024-08-06: qty 1.5

## 2024-08-13 ENCOUNTER — Other Ambulatory Visit (HOSPITAL_BASED_OUTPATIENT_CLINIC_OR_DEPARTMENT_OTHER): Payer: Self-pay

## 2024-08-13 ENCOUNTER — Telehealth: Payer: Self-pay | Admitting: Internal Medicine

## 2024-08-13 DIAGNOSIS — E782 Mixed hyperlipidemia: Secondary | ICD-10-CM

## 2024-08-13 MED ORDER — EZETIMIBE 10 MG PO TABS
10.0000 mg | ORAL_TABLET | Freq: Every day | ORAL | 0 refills | Status: AC
  Filled 2024-08-13: qty 90, 90d supply, fill #0

## 2024-08-13 NOTE — Telephone Encounter (Signed)
°*  STAT* If patient is at the pharmacy, call can be transferred to refill team.   1. Which medications need to be refilled? (please list name of each medication and dose if known)   ezetimibe  (ZETIA ) 10 MG tablet   2. Would you like to learn more about the convenience, safety, & potential cost savings by using the [facility]?   3. Are you open to using the [facility] (Type [facility]. ).  4. Which pharmacy/location (including street and city if local pharmacy) is medication to be sent to?  [facility]   5. Do they need a 30 day or 90 day supply?   90 day  Patient stated she only has 3 tablets left.   Patient has appointment scheduled with Dr. Okey on 2/12.

## 2024-08-13 NOTE — Telephone Encounter (Signed)
 Pt scheduled 10/09/24, refill sent.

## 2024-09-05 ENCOUNTER — Encounter (HOSPITAL_COMMUNITY): Payer: Self-pay | Admitting: Internal Medicine

## 2024-09-05 ENCOUNTER — Other Ambulatory Visit (HOSPITAL_COMMUNITY): Payer: Medicare Other

## 2024-09-05 ENCOUNTER — Other Ambulatory Visit (HOSPITAL_BASED_OUTPATIENT_CLINIC_OR_DEPARTMENT_OTHER): Payer: Self-pay

## 2024-09-05 ENCOUNTER — Other Ambulatory Visit: Payer: Self-pay

## 2024-09-05 MED ORDER — SPIRONOLACTONE 25 MG PO TABS
25.0000 mg | ORAL_TABLET | Freq: Two times a day (BID) | ORAL | 3 refills | Status: AC
  Filled 2024-09-05: qty 180, 90d supply, fill #0

## 2024-09-08 ENCOUNTER — Other Ambulatory Visit (HOSPITAL_BASED_OUTPATIENT_CLINIC_OR_DEPARTMENT_OTHER): Payer: Self-pay

## 2024-09-12 ENCOUNTER — Other Ambulatory Visit (HOSPITAL_BASED_OUTPATIENT_CLINIC_OR_DEPARTMENT_OTHER): Payer: Self-pay

## 2024-09-12 MED ORDER — VALACYCLOVIR HCL 1 G PO TABS
1000.0000 mg | ORAL_TABLET | Freq: Three times a day (TID) | ORAL | 0 refills | Status: AC
  Filled 2024-09-12: qty 21, 7d supply, fill #0

## 2024-09-25 ENCOUNTER — Ambulatory Visit: Admitting: Internal Medicine

## 2024-10-09 ENCOUNTER — Ambulatory Visit: Admitting: Internal Medicine

## 2024-10-17 ENCOUNTER — Ambulatory Visit: Admitting: Internal Medicine

## 2024-11-05 ENCOUNTER — Ambulatory Visit: Admitting: Pulmonary Disease

## 2025-02-05 ENCOUNTER — Encounter (HOSPITAL_COMMUNITY)
